# Patient Record
Sex: Male | Born: 1985
Health system: Southern US, Community
[De-identification: ages and names within clinical notes are randomized; demographics above are authoritative.]

## PROBLEM LIST (undated history)

## (undated) DIAGNOSIS — T8859XA Other complications of anesthesia, initial encounter: Secondary | ICD-10-CM

## (undated) DIAGNOSIS — T4145XA Adverse effect of unspecified anesthetic, initial encounter: Secondary | ICD-10-CM

## (undated) DIAGNOSIS — F419 Anxiety disorder, unspecified: Secondary | ICD-10-CM

## (undated) DIAGNOSIS — E782 Mixed hyperlipidemia: Secondary | ICD-10-CM

## (undated) DIAGNOSIS — K219 Gastro-esophageal reflux disease without esophagitis: Secondary | ICD-10-CM

## (undated) DIAGNOSIS — R519 Headache, unspecified: Secondary | ICD-10-CM

## (undated) DIAGNOSIS — K439 Ventral hernia without obstruction or gangrene: Secondary | ICD-10-CM

## (undated) DIAGNOSIS — G4730 Sleep apnea, unspecified: Secondary | ICD-10-CM

## (undated) DIAGNOSIS — T884XXA Failed or difficult intubation, initial encounter: Secondary | ICD-10-CM

## (undated) DIAGNOSIS — I10 Essential (primary) hypertension: Secondary | ICD-10-CM

## (undated) DIAGNOSIS — A6920 Lyme disease, unspecified: Secondary | ICD-10-CM

## (undated) DIAGNOSIS — R51 Headache: Secondary | ICD-10-CM

## (undated) DIAGNOSIS — F909 Attention-deficit hyperactivity disorder, unspecified type: Secondary | ICD-10-CM

## (undated) HISTORY — DX: Lyme disease, unspecified: A69.20

## (undated) HISTORY — PX: TONSILLECTOMY AND ADENOIDECTOMY: SUR1326

---

## 2010-10-06 ENCOUNTER — Encounter (INDEPENDENT_AMBULATORY_CARE_PROVIDER_SITE_OTHER): Payer: Self-pay | Admitting: Internal Medicine

## 2010-10-06 ENCOUNTER — Ambulatory Visit
Admission: RE | Admit: 2010-10-06 | Discharge: 2010-10-06 | Payer: Self-pay | Source: Home / Self Care | Attending: Internal Medicine | Admitting: Internal Medicine

## 2010-11-10 ENCOUNTER — Ambulatory Visit
Admission: RE | Admit: 2010-11-10 | Discharge: 2010-11-10 | Payer: Self-pay | Source: Home / Self Care | Attending: Internal Medicine | Admitting: Internal Medicine

## 2010-11-10 DIAGNOSIS — J111 Influenza due to unidentified influenza virus with other respiratory manifestations: Secondary | ICD-10-CM | POA: Insufficient documentation

## 2010-11-12 NOTE — Assessment & Plan Note (Signed)
Summary: VOMITTING AND CAN   Vital Signs:  Patient Profile:   25 Years Old Male CC:      Nausea & Vomiting Height:     70.75 inches Weight:      219 pounds BMI:     30.87 O2 Sat:      100 % O2 treatment:    Room Air Temp:     97.6 degrees F oral Pulse rate:   82 / minute Pulse rhythm:   regular Resp:     20 per minute BP sitting:   133 / 81  (right arm)  Pt. in pain?   yes    Location:   abdomen    Intensity:   4    Type:       aching  Vitals Entered By: Levonne Spiller EMT-P (October 06, 2010 12:23 PM)              Is Patient Diabetic? No  Does patient need assistance? Functional Status Self care Ambulation Normal      Current Allergies: ! AMOXICILLIN Lab Results    Ordered by:  Hyacinth Meeker md    Date tests performed: 10/06/2010    Performed by:  r.toney emt    R. Flu (A & B): Neg  History of Present Illness Chief Complaint: Nausea & Vomiting x 1 day. History of Present Illness: Sudden onset yesterday of vomiting after eating and watery diarrhea. no bleeding. no abd pain unless he tries to eat such as the hamburger last pm. No f/c, aches, fatigue but is quite hungry. Only med tried is aleve. No known exposures.   REVIEW OF SYSTEMS Constitutional Symptoms      Denies fever, chills, night sweats, weight loss, weight gain, and fatigue.  Eyes       Complains of glasses.      Denies change in vision, eye pain, eye discharge, contact lenses, and eye surgery. Ear/Nose/Throat/Mouth       Denies hearing loss/aids, change in hearing, ear pain, ear discharge, dizziness, frequent runny nose, frequent nose bleeds, sinus problems, sore throat, hoarseness, and tooth pain or bleeding.  Respiratory       Denies dry cough, productive cough, wheezing, shortness of breath, asthma, bronchitis, and emphysema/COPD.  Cardiovascular       Denies murmurs, chest pain, and tires easily with exhertion.    Gastrointestinal       Complains of stomach pain, nausea/vomiting, and diarrhea.       Denies constipation, blood in bowel movements, and indigestion. Genitourniary       Denies painful urination, kidney stones, and loss of urinary control. Neurological       Complains of headaches.      Denies paralysis, seizures, and fainting/blackouts.      Comments: headache gone Musculoskeletal       Denies muscle pain, joint pain, joint stiffness, decreased range of motion, redness, swelling, muscle weakness, and gout.  Skin       Denies bruising, unusual mles/lumps or sores, and hair/skin or nail changes.  Psych       Denies mood changes, temper/anger issues, anxiety/stress, speech problems, depression, and sleep problems. Lab Results    Ordered by:  Hyacinth Meeker md    Date tests performed: 10/06/2010    Performed by:  r.toney emt    R. Flu (A & B): Neg    Past History:  Past Medical History: Unremarkable  Past Surgical History: Tonsillectomy  Family History: Family History Diabetes 1st degree relative copd  Social  History: training presently for food service management Never Smoked Alcohol use-no Drug use-no Smoking Status:  never Drug Use:  no Physical Exam General appearance: well developed, well nourished, no acute distress Head: normocephalic, atraumatic Eyes: conjunctivae and lids normal Pupils: equal, round, reactive to light Ears: normal, no lesions or deformities Nasal: mucosa pink, nonedematous, no septal deviation, turbinates normal Oral/Pharynx: tongue normal, posterior pharynx without erythema or exudate Neck: neck supple,  trachea midline, no masses Chest/Lungs: no rales, wheezes, or rhonchi bilateral, breath sounds equal without effort Heart: regular rate and  rhythm, no murmur Abdomen: decreased bowel sounds but soft, non-tender without obvious organomegaly Extremities: normal extremities Neurological: grossly intact and non-focal Skin: no obvious rashes MSE: oriented to time, place, and person Assessment New Problems: FAMILY HISTORY DIABETES 1ST  DEGREE RELATIVE (ICD-V18.0) GASTROENTERITIS (ICD-558.9)   Plan  The patient and/or caregiver has been counseled thoroughly with regard to medications prescribed including dosage, schedule, interactions, rationale for use, and possible side effects and they verbalize understanding.  Diagnoses and expected course of recovery discussed and will return if not improved as expected or if the condition worsens. Patient and/or caregiver verbalized understanding.   Patient Instructions: 1)  clear liquid diet, advance as tolerated. 2)  immodium ad. 2 then 1 after each loose bm up to 8/day. 3)  emetrol for nausea. 4)  check temps. watch bm's for black or blood. 5)  Please schedule a follow-up appointment as needed. 6)  the main problem with gastroenteritis is dehydration. Drink plenty of fluids and take solids as you feel better. If you are unable to keep anything down and/or you show signs of dehydration(dry/cracked lips, lack of tears, not urinating, very sleepy), call our office. 7)  flu test was negative. 8)  out of work for 1-3 days as needed.

## 2010-11-18 NOTE — Assessment & Plan Note (Signed)
Summary: SORE, ACHY,CONGESTED, HEADACHE/JBB   Vital Signs:  Patient profile:   25 year old male O2 Sat:      100 % Temp:     98.3 degrees F oral Pulse rate:   88 / minute Pulse rhythm:   regular Resp:     14 per minute BP sitting:   128 / 82  (left arm)  CC:  sudden onset fever, myalgias, and fatigue yesterday.  History of Present Illness: patient with some clear nasal discharge starting 1/25. felt well otherwise. yesterday with sudden onset severe symptoms's. tried 3 aleve's this am. works in Personnel officer.  Medications Prior to Update: 1)  Aleve 220 Mg Tabs (Naproxen Sodium) .... As Needed  Allergies (verified): 1)  ! Amoxicillin  Past History:  Past Medical History: Last updated: 10/06/2010 Unremarkable  Past Surgical History: Last updated: 10/06/2010 Tonsillectomy  Social History: Last updated: 10/06/2010 training presently for food service management Never Smoked Alcohol use-no Drug use-no  Review of Systems       The patient complains of anorexia, fever, and headaches.         temp 102 before aleve. marked gen'l myalgias. headaches mild and similar to previous migraines. mild sore throat.  Physical Exam  General:  Well-developed,well-nourished,in no acute distress; alert,appropriate and cooperative throughout examination. never coughed Head:  Normocephalic and atraumatic without obvious abnormalities. No apparent alopecia or balding. Eyes:  No corneal or conjunctival inflammation noted. EOMI. Perrla. Ears:  External ear exam shows no significant lesions or deformities.  Otoscopic examination reveals clear canals, tympanic membranes are intact bilaterally without bulging, retraction, inflammation or discharge. Hearing is grossly normal bilaterally. Nose:  External nasal examination shows no deformity or inflammation. Nasal mucosa are pink and moist without lesions or exudates. Mouth:  Oral mucosa and oropharynx without lesions or exudates.  Teeth in good  repair. Neck:  supple and full ROM.   Lungs:  Normal respiratory effort, chest expands symmetrically. Lungs are clear to auscultation, no crackles or wheezes. Neurologic:  No cranial nerve deficits noted. Station and gait are normal.  Sensory, motor and coordinative functions appear intact. Skin:  Intact without suspicious rashes Cervical Nodes:  No lymphadenopathy noted Psych:  Cognition and judgment appear intact. Alert and cooperative with normal attention span and concentration.    Problems:  Medical Problems Added: 1)  Dx of Influenza Like Illness  (ICD-487.1)  Complete Medication List: 1)  Aleve 220 Mg Tabs (Naproxen sodium) .... As needed 2)  Tamiflu 75 Mg Caps (Oseltamivir phosphate) .Marland Kitchen.. 1 by mouth bid  Patient Instructions: 1)  fluids, rest, 2)  aleve max 2 two times a day pc. 3)  Take 650-1000mg  of Tylenol every 4-6 hours as needed for relief of pain or comfort of fever AVOID taking more than 4000mg   in a 24 hour period (can cause liver damage in higher doses 4)  out of work 2-3 days 5)  Please schedule a follow-up appointment as needed. Prescriptions: TAMIFLU 75 MG CAPS (OSELTAMIVIR PHOSPHATE) 1 by mouth bid  #10 x 0   Entered and Authorized by:   J. Juline Patch MD   Signed by:   Shela Commons. Juline Patch MD on 11/10/2010   Method used:   Electronically to        [facility] (retail)       [address], [address]       [place], [place]  [address]  Ph: [phone]       Fax: [phone]   RxID:   [id number]

## 2011-11-08 ENCOUNTER — Ambulatory Visit (INDEPENDENT_AMBULATORY_CARE_PROVIDER_SITE_OTHER): Payer: Self-pay | Admitting: Family Medicine

## 2011-11-08 ENCOUNTER — Encounter: Payer: Self-pay | Admitting: Family Medicine

## 2011-11-08 VITALS — BP 104/70 | HR 83 | Temp 97.8°F | Ht 71.0 in | Wt 234.0 lb

## 2011-11-08 DIAGNOSIS — R5383 Other fatigue: Secondary | ICD-10-CM

## 2011-11-08 LAB — CBC WITH DIFFERENTIAL/PLATELET
Basophils Relative: 0.6 % (ref 0.0–3.0)
Eosinophils Absolute: 0.1 10*3/uL (ref 0.0–0.7)
Hemoglobin: 14.6 g/dL (ref 13.0–17.0)
Lymphocytes Relative: 40 % (ref 12.0–46.0)
MCHC: 34.5 g/dL (ref 30.0–36.0)
Monocytes Relative: 5.1 % (ref 3.0–12.0)
Neutrophils Relative %: 53.1 % (ref 43.0–77.0)
RBC: 4.92 Mil/uL (ref 4.22–5.81)
WBC: 8.2 10*3/uL (ref 4.5–10.5)

## 2011-11-08 LAB — COMPREHENSIVE METABOLIC PANEL
ALT: 19 U/L (ref 0–53)
AST: 20 U/L (ref 0–37)
Albumin: 4.1 g/dL (ref 3.5–5.2)
BUN: 12 mg/dL (ref 6–23)
CO2: 31 mEq/L (ref 19–32)
Calcium: 9.3 mg/dL (ref 8.4–10.5)
Chloride: 106 mEq/L (ref 96–112)
GFR: 102.21 mL/min (ref >60.00)
Potassium: 4.2 mEq/L (ref 3.5–5.1)

## 2011-11-08 NOTE — Progress Notes (Signed)
Fatigue.  Going on for extended period.  Napping.  Needs to drink energy drinks to stay awake.  Persistent fatigue.  Weight has fluctuated.  Not snoring under usual circumstances, unless he's really had a hard day.  Drowsy during the day.  Ongoing since college.  H/o anemia.    Wife gave birth to twins recently, 1 died of heart malformation.   PMH and SH reviewed  ROS: See HPI, otherwise noncontributory.  Meds, vitals, and allergies reviewed.   GEN: nad, alert and oriented, he looks tired but affect and speech wnl HEENT: mucous membranes moist NECK: supple w/o LA, no TMG CV: rrr. PULM: ctab, no inc wob ABD: soft, +bs EXT: no edema SKIN: no acute rash

## 2011-11-08 NOTE — Patient Instructions (Addendum)
I would think about calling [facility] [phone]. You can get your results through our phone system.  Follow the instructions on the blue card. Take care.

## 2011-11-09 ENCOUNTER — Encounter: Payer: Self-pay | Admitting: Family Medicine

## 2011-11-09 DIAGNOSIS — R5383 Other fatigue: Secondary | ICD-10-CM | POA: Insufficient documentation

## 2011-11-09 HISTORY — DX: Other fatigue: R53.83

## 2011-11-09 NOTE — Assessment & Plan Note (Addendum)
Sx predate recent family crisis with death of child.  CBC/CMET/TSH wnl.  Refer for sleep study.  If neg, will need further eval for possible narcolepsy.  I don't think this is due to depression.  He appears to be grieving appropriately.  >30 min spent with face to face with patient, >50% counseling and/or coordinating care

## 2011-11-23 ENCOUNTER — Ambulatory Visit: Payer: BC Managed Care – PPO | Admitting: Family Medicine

## 2011-11-23 DIAGNOSIS — Z0289 Encounter for other administrative examinations: Secondary | ICD-10-CM

## 2011-12-01 ENCOUNTER — Institutional Professional Consult (permissible substitution): Payer: BC Managed Care – PPO | Admitting: Pulmonary Disease

## 2011-12-02 ENCOUNTER — Encounter: Payer: Self-pay | Admitting: Pulmonary Disease

## 2011-12-02 ENCOUNTER — Ambulatory Visit (INDEPENDENT_AMBULATORY_CARE_PROVIDER_SITE_OTHER): Payer: BC Managed Care – PPO | Admitting: Pulmonary Disease

## 2011-12-02 VITALS — BP 120/72 | HR 80 | Temp 98.1°F | Ht 70.5 in | Wt 237.6 lb

## 2011-12-02 DIAGNOSIS — G4710 Hypersomnia, unspecified: Secondary | ICD-10-CM | POA: Insufficient documentation

## 2011-12-02 NOTE — Patient Instructions (Signed)
Sleep study -overnight + daytime nap testing

## 2011-12-02 NOTE — Assessment & Plan Note (Signed)
Differential diagnosis of his hypersomnolence includes narcolepsy or idiopathic hypersomnolence. He does not seem to have obvious obstructive sleep apnea or periodic limb movement disorder. Chronic fatigue syndrome must also be considered especially given the history of Lyme's disease. An overnight polysomnogram will be scheduled followed by a multiple sleep latency test. We will look for sleep onset REM sleep during his naps. We will also explore sleep architecture and look for sleep disordered breathing. If this workup is negative and we may have to refer to a neurologist to explore chronic fatigue. I presume that his blood work has been checked for hypothyroidism

## 2011-12-02 NOTE — Progress Notes (Signed)
  Subjective:    Patient ID: Gerald [name], male    DOB: 02/06/1986, 26 y.o.   MRN: [id number]  HPI 26 year old never smoker presents for evaluation of hypersomnolence. Epworth sleepiness score is 18/24. He works as a Agricultural consultant for [employer] and report sleepiness and radius social situations such as sitting and reading sitting quietly after lunch as a passenger in a car or even during long drives. He reports being exhausted all day in spite of adequate sleep time more than 6 hours. He's unable to wake up at all in the mornings,' my body feels too sedated'. Around 2 to 3 PM he gets extremely tired. Bedtime is 11:30 to 30 a.m., sleep latency is less than 10 minutes, he sleeps on his side with one pillow, he has one nocturnal awakening at the most, gets out of bed at 8 to 10 AM feeling tired with dryness of mouth but no headaches. His wt has been as high as 280 pounds, recently has fluctuated between 210 and 237 pounds. There is no history suggestive of cataplexy. He reports inability to move his arms and legs on waking up from sleep-this history is not classic for sleep paralysis. He does report talking in his sleep but no other parasomnias.  He drinks 6 caffeinated beverages x20 ounces daily and will take an energy drink about twice a week. He reports Lyme's disease at the age of 78 was diagnosed at [facility]. He does not smoke or drink alcohol. No snoring or apneas have been witnessed by his wife. He has been married for 7 years and has 2 children. He reports sleep onset insomnia as a teenager and required Tylenol PM for a few years. He has always been a night owl since teenage years.    Review of Systems  Constitutional: Negative for fever, appetite change and unexpected weight change.  HENT: Negative for ear pain, congestion, sore throat, rhinorrhea, sneezing, trouble swallowing, dental problem and postnasal drip.   Eyes: Negative for redness.  Respiratory: Positive for cough. Negative for  shortness of breath and wheezing.   Cardiovascular: Negative for chest pain, palpitations and leg swelling.  Gastrointestinal: Negative for nausea, vomiting, abdominal pain and diarrhea.  Genitourinary: Negative for dysuria and urgency.  Musculoskeletal: Negative for joint swelling.  Skin: Negative for rash.  Neurological: Positive for headaches. Negative for syncope.  Hematological: Does not bruise/bleed easily.  Psychiatric/Behavioral: Negative for dysphoric mood. The patient is not nervous/anxious.        Objective:   Physical Exam  Gen. Pleasant, well-nourished, in no distress, normal affect ENT - no lesions, no post nasal drip, class 2 airway Neck: No JVD, no thyromegaly, no carotid bruits Lungs: no use of accessory muscles, no dullness to percussion, clear without rales or rhonchi  Cardiovascular: Rhythm regular, heart sounds  normal, no murmurs or gallops, no peripheral edema Abdomen: soft and non-tender, no hepatosplenomegaly, BS normal. Musculoskeletal: No deformities, no cyanosis or clubbing Neuro:  alert, non focal       Assessment & Plan:

## 2012-01-03 ENCOUNTER — Ambulatory Visit (HOSPITAL_BASED_OUTPATIENT_CLINIC_OR_DEPARTMENT_OTHER): Payer: BC Managed Care – PPO

## 2012-01-04 ENCOUNTER — Encounter (HOSPITAL_BASED_OUTPATIENT_CLINIC_OR_DEPARTMENT_OTHER): Payer: BC Managed Care – PPO

## 2012-09-28 ENCOUNTER — Ambulatory Visit (INDEPENDENT_AMBULATORY_CARE_PROVIDER_SITE_OTHER): Payer: BC Managed Care – PPO | Admitting: Family Medicine

## 2012-09-28 ENCOUNTER — Encounter: Payer: Self-pay | Admitting: Family Medicine

## 2012-09-28 VITALS — BP 116/82 | HR 93 | Temp 98.4°F | Wt 243.2 lb

## 2012-09-28 DIAGNOSIS — R05 Cough: Secondary | ICD-10-CM

## 2012-09-28 DIAGNOSIS — R6883 Chills (without fever): Secondary | ICD-10-CM

## 2012-09-28 DIAGNOSIS — R52 Pain, unspecified: Secondary | ICD-10-CM

## 2012-09-28 DIAGNOSIS — Z639 Problem related to primary support group, unspecified: Secondary | ICD-10-CM

## 2012-09-28 DIAGNOSIS — B349 Viral infection, unspecified: Secondary | ICD-10-CM

## 2012-09-28 DIAGNOSIS — B9789 Other viral agents as the cause of diseases classified elsewhere: Secondary | ICD-10-CM

## 2012-09-28 LAB — POCT INFLUENZA A/B: Influenza A, POC: NEGATIVE

## 2012-09-28 NOTE — Patient Instructions (Addendum)
Drink plenty of fluids, take tylenol or aleve (with food) as needed, and gargle with warm salt water for your throat.  This should gradually improve.  Take care.  Let us know if you have other concerns.

## 2012-09-28 NOTE — Progress Notes (Signed)
Sx started about 2 days ago. Diffuse aches noted first.  Felt 'swimmy headed' yesterday.  Rested at home yesterday.  No vomiting but some nausea.  Taking tylenol PM.  ST today.  Some diarrhea.  Fever yesterday.  Waking up with sweats.  Chills at night.    Hadn't had a flu shot yet.  Discussed.    Flu test neg and discussed with patient.  Had taken left over tamiflu w/o any change in symptoms so far  Meds, vitals, and allergies reviewed.   ROS: See HPI.  Otherwise, noncontributory.  GEN: nad, alert and oriented HEENT: mucous membranes moist, tm w/o erythema, nasal exam w/o erythema, clear discharge noted,  OP with cobblestoning and L side of soft palate with ulceration typical of viral infections.   NECK: supple w/o LA CV: rrr.   PULM: ctab, no inc wob EXT: no edema SKIN: no acute rash

## 2012-09-29 DIAGNOSIS — Z639 Problem related to primary support group, unspecified: Secondary | ICD-10-CM | POA: Insufficient documentation

## 2012-09-29 NOTE — Assessment & Plan Note (Signed)
D/w pt and he/wife are considering counseling.

## 2012-09-29 NOTE — Assessment & Plan Note (Signed)
Encouraged a flu shot when well.  Discussed with patient- likely viral.  Flu test neg.  He hadn't improved on tamiflu.  This may be a nonflu virus.  Supportive tx, f/u prn.  He agrees.  Nontoxic.

## 2012-11-30 ENCOUNTER — Ambulatory Visit: Payer: BC Managed Care – PPO | Admitting: Family Medicine

## 2012-11-30 DIAGNOSIS — Z0289 Encounter for other administrative examinations: Secondary | ICD-10-CM

## 2013-06-07 ENCOUNTER — Encounter: Payer: Self-pay | Admitting: Family Medicine

## 2013-06-07 ENCOUNTER — Ambulatory Visit (INDEPENDENT_AMBULATORY_CARE_PROVIDER_SITE_OTHER): Payer: BC Managed Care – PPO | Admitting: Family Medicine

## 2013-06-07 VITALS — BP 110/82 | HR 81 | Temp 97.9°F | Wt 237.8 lb

## 2013-06-07 DIAGNOSIS — J069 Acute upper respiratory infection, unspecified: Secondary | ICD-10-CM | POA: Insufficient documentation

## 2013-06-07 HISTORY — DX: Acute upper respiratory infection, unspecified: J06.9

## 2013-06-07 MED ORDER — AZITHROMYCIN 250 MG PO TABS: ORAL_TABLET | ORAL | Status: DC

## 2013-06-07 MED ORDER — BENZONATATE 200 MG PO CAPS: 200.0000 mg | ORAL_CAPSULE | Freq: Three times a day (TID) | ORAL | Status: DC | PRN

## 2013-06-07 NOTE — Assessment & Plan Note (Signed)
Start zmax given the duration and use tessalon prn cough.  Rest and fluids, f/u prn.  He agrees. Nontoxic.

## 2013-06-07 NOTE — Progress Notes (Signed)
He initially thought he had pinkeye.  Then had rhinorrhea.  His eyes improved but the congestion persisted, in spite of mucinex. Constantly clearing his throat, that caused a ST.  Going on >1 week.  No FCNAV.  No ear pain.  This isn't typical for allergies.  HA frontal and maxillary pain, also occipital.  No ear pain.  Mult sick exposures.    Meds, vitals, and allergies reviewed.   ROS: See HPI.  Otherwise, noncontributory.  GEN: nad, alert and oriented HEENT: mucous membranes moist, tm w/o erythema, nasal exam w/o erythema, clear discharge noted,  OP with cobblestoning NECK: supple w/o LA CV: rrr.   PULM: ctab, no inc wob EXT: no edema SKIN: no acute rash

## 2013-06-07 NOTE — Patient Instructions (Addendum)
Start the antibiotics today.  Tessalon for cough.  Drink plenty of fluids, take tylenol as needed, and gargle with warm salt water for your throat.  This should gradually improve.  Take care.  Let us know if you have other concerns.

## 2014-01-08 ENCOUNTER — Ambulatory Visit (INDEPENDENT_AMBULATORY_CARE_PROVIDER_SITE_OTHER): Payer: BC Managed Care – PPO | Admitting: Family Medicine

## 2014-01-08 ENCOUNTER — Encounter: Payer: Self-pay | Admitting: Family Medicine

## 2014-01-08 VITALS — BP 122/80 | HR 91 | Temp 98.4°F | Wt 262.5 lb

## 2014-01-08 DIAGNOSIS — R079 Chest pain, unspecified: Secondary | ICD-10-CM | POA: Insufficient documentation

## 2014-01-08 DIAGNOSIS — K219 Gastro-esophageal reflux disease without esophagitis: Secondary | ICD-10-CM

## 2014-01-08 DIAGNOSIS — R0789 Other chest pain: Secondary | ICD-10-CM

## 2014-01-08 NOTE — Assessment & Plan Note (Signed)
Likely chest wall pain with possible GERD component.  Main goal is weight loss with lifestyle changes.  D/w pt.  He agrees. He'll check with his wife about plans, scheduling time/meals.   Can use PPI PRN in meantime, reassured re: chest wall pain.  F/u prn.

## 2014-01-08 NOTE — Patient Instructions (Signed)
I think you can get the weight off gradually.   Let me know if you want to go to nutrition in the meantime.  Heart.org has good cooking ideas.   You can try OTC omeprazole in the meantime, taken daily.

## 2014-01-08 NOTE — Progress Notes (Signed)
Pre visit review using our clinic review tool, if applicable. No additional management support is needed unless otherwise documented below in the visit note.  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  Weight increased after that.  Less motivation and focus to lose weight noted.  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  He had been trying to exercise but this was irregular.  He has been "off the bandwagon."    Possible acid reflux.  Just to the L side of the sternum.  1-2/10.  Some days pressing on the area can make it better or worse. Worse with spicy foods.  Taking OTC nexium w/o much relief but he took it after the sx had started.  Going on for about 1-2 years.  Can happen at rest.  Not exertional, not worse with exertional.  He'll get SOB with exertion but he attributes that to deconditioning.   Meds, vitals, and allergies reviewed.   ROS: See HPI.  Otherwise, noncontributory.  nad ncat Mmm Neck supple, no LA rrr ctab abd soft L chest wall slightly ttp just lateral to sternum, some pain with deep breath but improved with external compression during deep breath.

## 2014-01-14 ENCOUNTER — Encounter: Payer: Self-pay | Admitting: Family Medicine

## 2014-01-14 ENCOUNTER — Ambulatory Visit (INDEPENDENT_AMBULATORY_CARE_PROVIDER_SITE_OTHER)
Admission: RE | Admit: 2014-01-14 | Discharge: 2014-01-14 | Disposition: A | Discharge status: Home / Self Care | Payer: BC Managed Care – PPO | Source: Ambulatory Visit | Attending: Family Medicine | Admitting: Family Medicine

## 2014-01-14 ENCOUNTER — Ambulatory Visit (INDEPENDENT_AMBULATORY_CARE_PROVIDER_SITE_OTHER): Payer: BC Managed Care – PPO | Admitting: Family Medicine

## 2014-01-14 VITALS — BP 120/80 | HR 83 | Temp 97.9°F | Ht 71.0 in | Wt 267.0 lb

## 2014-01-14 DIAGNOSIS — S93499A Sprain of other ligament of unspecified ankle, initial encounter: Secondary | ICD-10-CM

## 2014-01-14 DIAGNOSIS — M25579 Pain in unspecified ankle and joints of unspecified foot: Secondary | ICD-10-CM

## 2014-01-14 DIAGNOSIS — M25571 Pain in right ankle and joints of right foot: Secondary | ICD-10-CM

## 2014-01-14 DIAGNOSIS — S93491A Sprain of other ligament of right ankle, initial encounter: Secondary | ICD-10-CM

## 2014-01-14 DIAGNOSIS — S96819A Strain of other specified muscles and tendons at ankle and foot level, unspecified foot, initial encounter: Secondary | ICD-10-CM

## 2014-01-14 NOTE — Progress Notes (Signed)
Date:  01/14/2014   Name:  Gerald [name]   DOB:  05-16-1986   MRN:  [id number]  Primary Physician:  Crawford Givens, MD   Chief Complaint: Ankle Pain   Subjective:   History of Present Illness:  Gerald [name] is a 28 y.o. pleasant patient who presents with the following:  Right ankle pain, went up for a rebound and came down and classic lateral ankle injury. Walked off the injury, but it was still painful .  He was initially able to ambulate and walk off some pain, but he has had progressively worsening pain in the RIGHT ankle.  Date of injury January 13, 2014.  He has had multiple ankle sprains in the past, and none worsen this, but he is not had any specific occult fracture in the lower extremity. He denies any operative intervention in the lower extremity. He does have pain with really any sort of movement in any direction with the ankle.  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  Patient Active Problem List   Diagnosis Date Noted  . Atypical chest pain 01/08/2014  . Family circumstance 09/29/2012  . Hypersomnolence 12/02/2011  . Fatigue 11/09/2011    Past Medical History  Diagnosis Date  . Lyme disease     Past Surgical History  Procedure Laterality Date  . Tonsillectomy and adenoidectomy  07-Mar-1990    History   Social History  . Marital Status: Married    Spouse Name: N/A    Number of Children: N/A  . Years of Education: N/A   Occupational History  . Manager    Social History Main Topics  . Smoking status: Never Smoker   . Smokeless tobacco: Never Used  . Alcohol Use: No  . Drug Use: No  . Sexual Activity: Not on file   Other Topics Concern  . Not on file   Social History Narrative   Agricultural consultant for [employer]   Married   2 kids, 3rd child died of hypoplastic L heart 03-07-2012    Family History  Problem Relation Age of Onset  . Drug abuse Mother   . Hypertension Mother   . Diabetes Father     Allergies  Allergen  Reactions  . Amoxicillin     REACTION: Hives    Medication list has been reviewed and updated.  Review of Systems:  GEN: No fevers, chills. Nontoxic. Primarily MSK c/o today. MSK: Detailed in the HPI GI: tolerating PO intake without difficulty Neuro: No numbness, parasthesias, or tingling associated. Otherwise the pertinent positives of the ROS are noted above.   Objective:   Physical Examination: BP 120/80  Pulse 83  Temp(Src) 97.9 F (36.6 C) (Oral)  Ht 5\' 11"  (1.803 m)  Wt 267 lb (121.11 kg)  BMI 37.26 kg/m2  Ideal Body Weight: Weight in (lb) to have BMI = 25: 178.9   GEN: Well-developed,well-nourished,in no acute distress; alert,appropriate and cooperative throughout examination HEENT: Normocephalic and atraumatic without obvious abnormalities. Ears, externally no deformities PULM: Breathing comfortably in no respiratory distress EXT: No clubbing, cyanosis, or edema PSYCH: Normally interactive. Cooperative during the interview. Pleasant. Friendly and conversant. Not anxious or depressed appearing. Normal, full affect.  ANKLE: R Echymosis: no Edema: moderate dorsal and lateral ROM: Restricted dorsi and plantar flexion, inversion, eversion - eversion resistance markedly painful Gait: heel toe, antalgic Lateral Mall: NT Medial Mall: NT Talus: TTP dorsal talus Navicular: NT Cuboid: NT Calcaneous: NT Metatarsals: NT 5th MT: NT Phalanges: NT  Achilles: NT Plantar Fascia: NT Fat Pad: NT Peroneals: NT Post Tib: NT Great Toe: Nml motion Ant Drawer: pain, but intact Talar Tilt: pain ATFL: pain CFL: NT Deltoid: NT Str: 5/5 Other Special tests: none Sensation: intact   Dg Ankle Complete Right  01/14/2014   CLINICAL DATA:  Right ankle pain.  Trauma 01/13/2014.  EXAM: RIGHT ANKLE - COMPLETE 3+ VIEW  COMPARISON:  None.  FINDINGS: No fracture or dislocation.  IMPRESSION: No fracture or dislocation.   Electronically Signed   By: Bridgett LarssonSteve  Olson M.D.   On: 01/14/2014  14:20   The radiological images were independently reviewed by myself in the office and results were reviewed with the patient. My independent interpretation of images:  The mortise view is suboptimal. There is no evidence for occult fracture. There is no evidence for ankle dislocation. There is no significant osteoarthritis. Electronically Signed  By: Hannah BeatSpencer Carlin Attridge, MD On: 01/15/2014 11:01 AM  Assessment & Plan:   Sprain of anterior talofibular ligament of right ankle  Right ankle pain - Plan: DG Ankle Complete Right  This appears to be a grade 2 ATFL sprain. I suspect that the patient will do well. Anticipated recovery is at a minimum one month. 6 months to have full recovery of proprioception would be normal. I placed him in a ankle Aircast. He should be able to return to work immediately. Repeat basic rehabilitation.  Tylenol or Alleve for pain.  He can followup if he is having significant difficulty in 4-6 weeks.  Follow-up: No Follow-up on file.  New Prescriptions   No medications on file   Orders Placed This Encounter  Procedures  . DG Ankle Complete Right   There are no Patient Instructions on file for this visit.  Signed,  Elpidio GaleaSpencer T. Lashun Ramseyer, MD, CAQ Sports Medicine  [facility] [address] [place] [address] Phone: [phone] Fax: [phone]  Patient's Medications  New Prescriptions   No medications on file  Previous Medications   ACETAMINOPHEN (TYLENOL) 500 MG TABLET    Take 500 mg by mouth every 6 (six) hours as needed.   MULTIPLE VITAMIN (MULTIVITAMIN) TABLET    Take 1 tablet by mouth daily.   NAPROXEN SODIUM 220 MG CAPS    Take 1 capsule by mouth as needed.  Modified Medications   No medications on file  Discontinued Medications   NAPROXEN SODIUM (ANAPROX) 220 MG TABLET    Take 220 mg by mouth as needed.

## 2014-01-14 NOTE — Progress Notes (Signed)
Pre visit review using our clinic review tool, if applicable. No additional management support is needed unless otherwise documented below in the visit note. 

## 2014-05-10 ENCOUNTER — Ambulatory Visit (INDEPENDENT_AMBULATORY_CARE_PROVIDER_SITE_OTHER): Payer: BC Managed Care – PPO | Admitting: Family Medicine

## 2014-05-10 ENCOUNTER — Encounter: Payer: Self-pay | Admitting: Family Medicine

## 2014-05-10 VITALS — BP 118/80 | HR 68 | Temp 98.0°F | Resp 18 | Wt 257.8 lb

## 2014-05-10 DIAGNOSIS — R5383 Other fatigue: Principal | ICD-10-CM

## 2014-05-10 DIAGNOSIS — J018 Other acute sinusitis: Secondary | ICD-10-CM

## 2014-05-10 DIAGNOSIS — R5381 Other malaise: Secondary | ICD-10-CM

## 2014-05-10 MED ORDER — AZITHROMYCIN 250 MG PO TABS: ORAL_TABLET | ORAL | Status: DC

## 2014-05-10 MED ORDER — FLUTICASONE PROPIONATE 50 MCG/ACT NA SUSP: 2.0000 | Freq: Every day | NASAL | Status: DC

## 2014-05-10 NOTE — Patient Instructions (Signed)
Use flonase and warm compresses for now.  Try to get some rest.  If not better, then start the antibiotics.  Come back for labs when you feel better. Schedule a lab visit.  Take care.

## 2014-05-10 NOTE — Progress Notes (Signed)
Pre visit review using our clinic review tool, if applicable. No additional management support is needed unless otherwise documented below in the visit note.  Duration of symptoms: 2-3 days, nasal voice, sore and scratchy throat.  Congestion some better today but then more R sided facial pressure, near the R eye.  Concentration and focus are worse than normal.  Sleep is not disrupted.    Fatigued, and that predates the recent illness.  Not snoring, per wife's report.  He thinks he's a little more irritable, "but that may just be who I am" but not depressed.     Intentional weight loss with diet.    ROS: See HPI.  Otherwise negative.    Meds, vitals, and allergies reviewed.   GEN: nad, alert and oriented HEENT: mucous membranes moist, TM w/o erythema, nasal epithelium injected, OP with cobblestoning NECK: supple w/o LA CV: rrr. PULM: ctab, no inc wob ABD: soft, +bs EXT: no edema R frontal and max sinuses ttp

## 2014-05-12 ENCOUNTER — Encounter: Payer: Self-pay | Admitting: Family Medicine

## 2014-05-12 DIAGNOSIS — J019 Acute sinusitis, unspecified: Secondary | ICD-10-CM | POA: Insufficient documentation

## 2014-05-12 NOTE — Assessment & Plan Note (Signed)
Hold abx for now. Start flonase for now.  D/w pt.  See instructions.  Return for labs re: fatigue, wouldn't check in the acute illness.  D/w pt. He agrees.

## 2014-06-19 ENCOUNTER — Encounter: Payer: Self-pay | Admitting: Family Medicine

## 2014-06-19 ENCOUNTER — Ambulatory Visit (INDEPENDENT_AMBULATORY_CARE_PROVIDER_SITE_OTHER): Payer: BC Managed Care – PPO | Admitting: Family Medicine

## 2014-06-19 VITALS — BP 116/86 | HR 79 | Temp 98.1°F | Wt 260.5 lb

## 2014-06-19 DIAGNOSIS — R5381 Other malaise: Secondary | ICD-10-CM

## 2014-06-19 DIAGNOSIS — J018 Other acute sinusitis: Secondary | ICD-10-CM

## 2014-06-19 DIAGNOSIS — R5383 Other fatigue: Secondary | ICD-10-CM

## 2014-06-19 MED ORDER — DOXYCYCLINE HYCLATE 100 MG PO TABS: 100.0000 mg | ORAL_TABLET | Freq: Two times a day (BID) | ORAL | Status: DC

## 2014-06-19 MED ORDER — MOMETASONE FUROATE 50 MCG/ACT NA SUSP: 2.0000 | Freq: Every day | NASAL | Status: DC

## 2014-06-19 NOTE — Progress Notes (Signed)
Pre visit review using our clinic review tool, if applicable. No additional management support is needed unless otherwise documented below in the visit note.  He had gotten some better prev after the last OV, when he had used the abx rx'd.  In the meantime, he's had recurring sinus pressure, now B and frontal.  Had been trying to use flonase but didn't tolerate the scent, it would help in the long run but the scent was tough to handle.  No fevers.  No cough, some nausea w/o vomiting.  No rhinorrhea, not very stuffy.  No ST.   He had his eyes checked and that exam was unremarkable.    Still with fatigue.  D/w pt about getting labs done when not ill.  He had deferred lab visit prev.   Meds, vitals, and allergies reviewed.   ROS: See HPI.  Otherwise, noncontributory.  GEN: nad, alert and oriented HEENT: mucous membranes moist, tm w/o erythema, nasal exam w/o erythema, clear discharge noted,  OP with cobblestoning, frontal sinuses ttp NECK: supple w/o LA CV: rrr.   PULM: ctab, no inc wob EXT: no edema SKIN: no acute rash

## 2014-06-19 NOTE — Patient Instructions (Addendum)
Change to nasonex, use the doxycycline and notify me if not better.   Try to limit the excedrin in the meantime.  Take care. Glad to see you.   Schedule a fasting lab visit when you are better.

## 2014-06-20 NOTE — Assessment & Plan Note (Signed)
Change to doxy, change to nasonex since he didn't tolerate the scent with flonase.  Nontoxic.  Supportive care o/w. F/u prn.  He agrees.

## 2014-06-20 NOTE — Assessment & Plan Note (Signed)
Return for labs, discussed.

## 2014-07-01 ENCOUNTER — Other Ambulatory Visit: Payer: BC Managed Care – PPO

## 2014-11-28 ENCOUNTER — Telehealth: Payer: Self-pay | Admitting: Family Medicine

## 2014-11-28 NOTE — Telephone Encounter (Signed)
Patient Name: Gerald [name] DOB: 08/20/1986 Initial Comment Caller states has been c/o weakness, throat hurts, headaches, coughing, sore throat, feels like he has been hit by a truck. Oldest son has been DX w/ Strep on Sunday Nurse Assessment Nurse: Elijah Birkaldwell, RN, Stark BrayLynda Date/Time Lamount Cohen(Eastern Time): 11/28/2014 1:19:38 PM Confirm and document reason for call. If symptomatic, describe symptoms. ---Caller states has been c/o weakness, body aches, headaches, coughing, itchy sore throat, feels like he has been hit by a truck. Oldest son has been dx with Strep on Sunday. Does not have tonsils. Not sure if fever, but has chills. Has the patient traveled out of the country within the last 30 days? ---Not Applicable Does the patient require triage? ---Yes Related visit to physician within the last 2 weeks? ---No Does the PT have any chronic conditions? (i.e. diabetes, asthma, etc.) ---No Guidelines Guideline Title Affirmed Question Affirmed Notes Strep Throat Exposure [1] Sore throat AND [2] strep throat EXPOSURE (i.e., meets definition) within past 10 days Final Disposition User Call PCP within 24 Hours Newhopealdwell, Charity fundraiserN, Tees TohLynda

## 2014-11-28 NOTE — Telephone Encounter (Signed)
Patient states he will likely go to the [facility] late this afternoon but will call first thing in the am if he still needs an appt.

## 2014-11-28 NOTE — Telephone Encounter (Signed)
Can you get him in tomorrow AM?  Thanks.

## 2014-12-03 ENCOUNTER — Ambulatory Visit (INDEPENDENT_AMBULATORY_CARE_PROVIDER_SITE_OTHER): Payer: BLUE CROSS/BLUE SHIELD | Admitting: Nurse Practitioner

## 2014-12-03 ENCOUNTER — Encounter: Payer: Self-pay | Admitting: Nurse Practitioner

## 2014-12-03 VITALS — BP 132/88 | HR 90 | Temp 98.1°F | Resp 14 | Ht 71.0 in | Wt 279.1 lb

## 2014-12-03 DIAGNOSIS — J22 Unspecified acute lower respiratory infection: Secondary | ICD-10-CM

## 2014-12-03 DIAGNOSIS — J988 Other specified respiratory disorders: Secondary | ICD-10-CM

## 2014-12-03 MED ORDER — AZITHROMYCIN 250 MG PO TABS: ORAL_TABLET | ORAL | Status: DC

## 2014-12-03 MED ORDER — HYDROCOD POLST-CHLORPHEN POLST 10-8 MG/5ML PO LQCR: 5.0000 mL | Freq: Every evening | ORAL | Status: DC | PRN

## 2014-12-03 NOTE — Progress Notes (Signed)
Pre visit review using our clinic review tool, if applicable. No additional management support is needed unless otherwise documented below in the visit note. 

## 2014-12-03 NOTE — Progress Notes (Signed)
   Subjective:    Patient ID: Gerald [name], male    DOB: 09/07/1986, 29 y.o.   MRN: [id number]  HPI  Gerald [name] is a 29 yo male with a CC of cough, sinusitis, laryngitis x 5 days. [facility]   1) Symptoms of the flu. Fever, body aches, chills. He reports he tested negative, but the NP felt it was early so she prescribed Tamiflu- improved Cough bad all day dry  Tylenol- helpful  Mucinex- helpful  Night time mucinex- somewhat helpful    Review of Systems  Constitutional: Positive for fatigue.  HENT: Positive for congestion, postnasal drip, sneezing, sore throat and voice change. Negative for ear discharge and ear pain.   Eyes: Negative for visual disturbance.  Respiratory: Positive for cough. Negative for chest tightness, shortness of breath and wheezing.   Cardiovascular: Negative for chest pain, palpitations and leg swelling.  Gastrointestinal: Negative for nausea, vomiting and diarrhea.  Skin: Negative for rash.  Neurological: Positive for light-headedness. Negative for dizziness and headaches.       Light headed with coughing        Objective:   Physical Exam  Constitutional: He is oriented to person, place, and time. He appears well-developed and well-nourished. No distress.  BP 132/88 mmHg  Pulse 90  Temp(Src) 98.1 F (36.7 C) (Oral)  Resp 14  Ht 5\' 11"  (1.803 m)  Wt 279 lb 1.6 oz (126.599 kg)  BMI 38.94 kg/m2  SpO2 95%   HENT:  Head: Normocephalic and atraumatic.  Right Ear: External ear normal.  Left Ear: External ear normal.  Eyes: Right eye exhibits no discharge. Left eye exhibits no discharge. No scleral icterus.  Neck: Normal range of motion. Neck supple. No thyromegaly present.  Cardiovascular: Normal rate, regular rhythm, normal heart sounds and intact distal pulses.  Exam reveals no gallop and no friction rub.   No murmur heard. Pulmonary/Chest: Effort normal. No respiratory distress. He has no wheezes. He  has no rales. He exhibits no tenderness.  Decreased breath sounds all over  Lymphadenopathy:    He has no cervical adenopathy.  Neurological: He is alert and oriented to person, place, and time.  Skin: Skin is warm and dry. He is not diaphoretic.      Assessment & Plan:

## 2014-12-03 NOTE — Patient Instructions (Signed)
Call if not improving by Monday.

## 2014-12-07 DIAGNOSIS — J22 Unspecified acute lower respiratory infection: Secondary | ICD-10-CM | POA: Insufficient documentation

## 2014-12-07 NOTE — Assessment & Plan Note (Signed)
Treating for URI/Lower respiratory infection. Z-pack 500 mg day 1 and 250 mg through day 4. Continue other conservative management. FU prn worsening/failure to improve.

## 2015-03-14 ENCOUNTER — Ambulatory Visit (INDEPENDENT_AMBULATORY_CARE_PROVIDER_SITE_OTHER): Payer: BLUE CROSS/BLUE SHIELD | Admitting: Primary Care

## 2015-03-14 ENCOUNTER — Encounter: Payer: Self-pay | Admitting: Primary Care

## 2015-03-14 VITALS — BP 128/90 | HR 105 | Temp 98.4°F | Ht 71.0 in | Wt 273.1 lb

## 2015-03-14 DIAGNOSIS — J329 Chronic sinusitis, unspecified: Secondary | ICD-10-CM | POA: Diagnosis not present

## 2015-03-14 DIAGNOSIS — R059 Cough, unspecified: Secondary | ICD-10-CM

## 2015-03-14 DIAGNOSIS — R05 Cough: Secondary | ICD-10-CM

## 2015-03-14 MED ORDER — AZITHROMYCIN 250 MG PO TABS: ORAL_TABLET | ORAL | Status: DC

## 2015-03-14 MED ORDER — BENZONATATE 200 MG PO CAPS: 200.0000 mg | ORAL_CAPSULE | Freq: Three times a day (TID) | ORAL | Status: DC | PRN

## 2015-03-14 NOTE — Progress Notes (Signed)
Pre visit review using our clinic review tool, if applicable. No additional management support is needed unless otherwise documented below in the visit note. 

## 2015-03-14 NOTE — Progress Notes (Signed)
Subjective:    Patient ID: Gerald NordmannNicholas [name], male    DOB: 04/03/1986, 29 y.o.   MRN: [id number]  HPI  Gerald [name] is a 29 year old male who presents today with a chief complaint of cough. His cough has been present intermittently throughout the year with worsening cough over the past 10 days. His cough is non productive. He also reports a scratchy throat, postnasal drip, sinus pressure, and fatigue. He's tried taking Flonase and nyquil without relief. He has a history of recurrent sinus infections which require treatment with antibiotics. He's not been evaluated with ENT. He does not take a daily claritin, zyrtec, allegra.  Review of Systems  Constitutional: Positive for fever. Negative for chills.  HENT: Positive for postnasal drip. Negative for congestion, ear pain, sinus pressure and sore throat.   Respiratory: Positive for cough. Negative for shortness of breath.   Cardiovascular: Negative for chest pain.  Gastrointestinal: Negative for nausea.  Musculoskeletal: Negative for myalgias.       Past Medical History  Diagnosis Date  . Lyme disease     History   Social History  . Marital Status: Married    Spouse Name: N/A  . Number of Children: N/A  . Years of Education: N/A   Occupational History  . Manager    Social History Main Topics  . Smoking status: Never Smoker   . Smokeless tobacco: Never Used  . Alcohol Use: No  . Drug Use: No  . Sexual Activity: Not on file   Other Topics Concern  . Not on file   Social History Narrative   Agricultural consultantDistrict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 [employer]   Married   2 kids, 3rd child died of hypoplastic L heart 2013    Past Surgical History  Procedure Laterality Date  . Tonsillectomy and adenoidectomy  ~1991    Family History  Problem Relation Age of Onset  . Drug abuse Mother   . Hypertension Mother   . Diabetes Father     Allergies  Allergen Reactions  . Amoxicillin     REACTION: Hives    Current Outpatient Prescriptions on File Prior to  Visit  Medication Sig Dispense Refill  . aspirin-acetaminophen-caffeine (EXCEDRIN MIGRAINE) 250-250-65 MG per tablet Take by mouth every 6 (six) hours as needed for headache.    . chlorpheniramine-HYDROcodone (TUSSIONEX) 10-8 MG/5ML LQCR Take 5 mLs by mouth at bedtime as needed for cough. 115 mL 0  . Multiple Vitamin (MULTIVITAMIN) tablet Take 1 tablet by mouth daily.     No current facility-administered medications on file prior to visit.    BP 128/90 mmHg  Pulse 105  Temp(Src) 98.4 F (36.9 C) (Oral)  Ht 5\' 11"  (1.803 m)  Wt 273 lb 1.9 oz (123.886 kg)  BMI 38.11 kg/m2  SpO2 95%    Objective:   Physical Exam  Constitutional: He appears ill.  HENT:  Right Ear: Tympanic membrane and ear canal normal.  Left Ear: Tympanic membrane and ear canal normal.  Nose: Right sinus exhibits no maxillary sinus tenderness and no frontal sinus tenderness. Left sinus exhibits no maxillary sinus tenderness and no frontal sinus tenderness.  Mouth/Throat: Oropharynx is clear and moist.  Eyes: Conjunctivae are normal. Pupils are equal, round, and reactive to light.  Neck: Neck supple.  Cardiovascular: Normal rate and regular rhythm.   Pulmonary/Chest: Effort normal and breath sounds normal.  Lymphadenopathy:    He has no cervical adenopathy.  Skin: Skin is warm and dry.  Assessment & Plan:  Cough:  Suspect this was allergy induced, but has developed into bacterial. Present for 10 days with sinus pressure, fatigue, postnasal drip. History of recurrent sinusitis which have required treatment with antibiotics. RX for Zpak and Tessalon pearls. Start Claritin daily. Referral made to ENT for further evaluation of sinus pressure and recurrent infections.

## 2015-03-14 NOTE — Patient Instructions (Signed)
Start Azithromycin antibiotics. Take 2 tablets by mouth today, then 1 tablet by mouth daily for 4 additional days. You may take Tessalon Pearls three times daily as needed for cough. You will be contacted regarding your referral to ENT.  Please let us know if you have not heard back within one week.   It was nice meeting you!

## 2015-04-09 ENCOUNTER — Encounter: Payer: Self-pay | Admitting: Primary Care

## 2015-04-09 ENCOUNTER — Ambulatory Visit: Payer: BLUE CROSS/BLUE SHIELD | Admitting: Internal Medicine

## 2015-04-09 ENCOUNTER — Ambulatory Visit (INDEPENDENT_AMBULATORY_CARE_PROVIDER_SITE_OTHER): Payer: BLUE CROSS/BLUE SHIELD | Admitting: Primary Care

## 2015-04-09 VITALS — BP 126/76 | HR 101 | Temp 97.6°F | Ht 71.0 in | Wt 268.8 lb

## 2015-04-09 DIAGNOSIS — F411 Generalized anxiety disorder: Secondary | ICD-10-CM | POA: Insufficient documentation

## 2015-04-09 MED ORDER — ESCITALOPRAM OXALATE 10 MG PO TABS: 10.0000 mg | ORAL_TABLET | Freq: Every day | ORAL | Status: DC

## 2015-04-09 NOTE — Assessment & Plan Note (Signed)
Anxious entire life, but moreso anxious since beginning to mid June. +irritability, daytime tiredness, sleep disturbance, worry. PHQ-9 score of 16 today. Start Lexapro 10 mg tablets. 1/2 tablet daily x 6 days, then 1 full tab thereafter. Explained possible side effects, denies SI/HI. Follow up in 6 weeks for re-eval with myself or PCP.

## 2015-04-09 NOTE — Progress Notes (Signed)
Subjective:    Patient ID: Gerald NordmannNicholas [name], male    DOB: 02/17/1986, 29 y.o.   MRN: [id number]  HPI  Gerald [name] is a 29 year old male who presents today with a chief complaint of anxiety. He first noticed his anxiety on June 9th. He returned from a [place] trip to [place] and noticed he was anxious, tearful, irritable all week. 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 He felt well during that following weekend and week, but he's been feeling anxious again this entire week with irritability, nervousness, and anxiousness. He is on the Thrive patch and has been on since Easter 2016. He stopped the Thrive treatment one week ago and continues to feel anxious.  He's noticed a decrease in his appetite intermittently since June 13th, he will wake up in the middle of the night with difficulty going back to sleep, feels daytime tiredness, difficulty concentrating, daily worry. He also recently started taking Claritin OTC during the 1st week of June (which he believes may be causing anxiety) and has not had since this past Sunday. He reports a lifetime history of worry and was talking with one of his friends this week who told him he was "high strung". Denies SI/HI. PHQ 9 score of 16 today.  Review of Systems  Respiratory: Negative for shortness of breath.   Cardiovascular: Negative for chest pain.  Neurological: Negative for headaches.  Psychiatric/Behavioral: Positive for sleep disturbance, decreased concentration and agitation. Negative for suicidal ideas. The patient is nervous/anxious.        Past Medical History  Diagnosis Date  . Lyme disease     History   Social History  . Marital Status: Married    Spouse Name: N/A  . Number of Children: N/A  . Years of Education: N/A   Occupational History  . Manager    Social History Main Topics  . Smoking status: Never Smoker   . Smokeless tobacco: Never Used  . Alcohol Use: No  . Drug Use: No  . Sexual Activity: Not on file   Other Topics  Concern  . Not on file   Social History Narrative   Agricultural consultantDistrict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 [employer]   Married   2 kids, 3rd child died of hypoplastic L heart 2013    Past Surgical History  Procedure Laterality Date  . Tonsillectomy and adenoidectomy  ~1991    Family History  Problem Relation Age of Onset  . Drug abuse Mother   . Hypertension Mother   . Diabetes Father     Allergies  Allergen Reactions  . Amoxicillin     REACTION: Hives    Current Outpatient Prescriptions on File Prior to Visit  Medication Sig Dispense Refill  . aspirin-acetaminophen-caffeine (EXCEDRIN MIGRAINE) 250-250-65 MG per tablet Take by mouth every 6 (six) hours as needed for headache.    . Multiple Vitamin (MULTIVITAMIN) tablet Take 1 tablet by mouth daily.    . Nicotine Polacrilex (THRIVE MT) Inject 1 patch into the skin.     No current facility-administered medications on file prior to visit.    BP 126/76 mmHg  Pulse 101  Temp(Src) 97.6 F (36.4 C) (Oral)  Ht 5\' 11"  (1.803 m)  Wt 268 lb 12.8 oz (121.927 kg)  BMI 37.51 kg/m2  SpO2 98%    Objective:   Physical Exam  Constitutional: He appears well-nourished.  Cardiovascular: Normal rate and regular rhythm.   Pulmonary/Chest: Effort normal and breath sounds normal.  Skin: Skin is  warm and dry.  Psychiatric: He has a normal mood and affect.          Assessment & Plan:

## 2015-04-09 NOTE — Patient Instructions (Signed)
Start Lexapro tablets for anxiety. Take 1/2 tablet by mouth daily for 6 days, then advance to 1 full tablet thereafter.  Follow up in 6 weeks for re-evaluation of anxiety.  It was nice to see you!

## 2015-08-16 ENCOUNTER — Other Ambulatory Visit: Payer: Self-pay | Admitting: Primary Care

## 2015-08-17 NOTE — Telephone Encounter (Signed)
Electronically refill request for   escitalopram (LEXAPRO) 10 MG tablet   Take 1 tablet (10 mg total) by mouth daily.  Dispense: 30 tablet   Refills: 3    Last prescribed on 04/09/2015. Last seen for anxiety with Jae DireKate on 04/09/2015. No future appointment.

## 2015-08-19 NOTE — Telephone Encounter (Signed)
sending

## 2015-08-21 NOTE — Telephone Encounter (Signed)
Sent a letter on 08/19/2015 for patient to schedule a follow up for further refills.

## 2015-10-14 ENCOUNTER — Encounter: Payer: Self-pay | Admitting: Primary Care

## 2015-10-14 ENCOUNTER — Ambulatory Visit (INDEPENDENT_AMBULATORY_CARE_PROVIDER_SITE_OTHER): Payer: BLUE CROSS/BLUE SHIELD | Admitting: Primary Care

## 2015-10-14 VITALS — BP 126/86 | HR 80 | Temp 97.7°F | Ht 71.0 in | Wt 280.8 lb

## 2015-10-14 DIAGNOSIS — F411 Generalized anxiety disorder: Secondary | ICD-10-CM | POA: Diagnosis not present

## 2015-10-14 MED ORDER — ESCITALOPRAM OXALATE 20 MG PO TABS: 20.0000 mg | ORAL_TABLET | Freq: Every day | ORAL | Status: DC

## 2015-10-14 NOTE — Progress Notes (Signed)
Pre visit review using our clinic review tool, if applicable. No additional management support is needed unless otherwise documented below in the visit note. 

## 2015-10-14 NOTE — Progress Notes (Signed)
   Subjective:    Patient ID: Gerald [name], male    DOB: 02/11/1986, 30 y.o.   MRN: [id number]  HPI  Gerald [name] is a 30 year old male who presents today for follow up of generalized anxiety disorder. He was initially evaluated in June 2016 for complaints of anxiety due to a high stress occupation. He was initiated on Lexapro 10 mg tablets and was instructed to follow up with PCP 6 weeks later.  Since his last visit he's noticed more "lethergy" in the morning with increased difficulty with getting out of bed and feels as though its harder to get his day started. Overall he's noticed a moderate decrease in his daily worry, anxiety, home life, and work life but continues to feel some symptoms. He states "my fuse is longer, but the reaction at the end is the same."  Denies GI upset, SI/HI, headaches.   Review of Systems  Respiratory: Negative for shortness of breath.   Cardiovascular: Negative for chest pain.  Gastrointestinal: Negative for nausea.  Neurological: Negative for headaches.  Psychiatric/Behavioral: Negative for suicidal ideas and sleep disturbance. The patient is nervous/anxious.        Past Medical History  Diagnosis Date  . Lyme disease     Social History   Social History  . Marital Status: Married    Spouse Name: N/A  . Number of Children: N/A  . Years of Education: N/A   Occupational History  . Manager    Social History Main Topics  . Smoking status: Never Smoker   . Smokeless tobacco: Never Used  . Alcohol Use: No  . Drug Use: No  . Sexual Activity: Not on file   Other Topics Concern  . Not on file   Social History Narrative   Agricultural consultantDistrict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 [employer]   Married   2 kids, 3rd child died of hypoplastic L heart 2013    Past Surgical History  Procedure Laterality Date  . Tonsillectomy and adenoidectomy  ~1991    Family History  Problem Relation Age of Onset  . Drug abuse Mother   . Hypertension Mother   . Diabetes Father     Allergies    Allergen Reactions  . Amoxicillin     REACTION: Hives    Current Outpatient Prescriptions on File Prior to Visit  Medication Sig Dispense Refill  . aspirin-acetaminophen-caffeine (EXCEDRIN MIGRAINE) 250-250-65 MG per tablet Take by mouth every 6 (six) hours as needed for headache. Reported on 10/14/2015    . Multiple Vitamin (MULTIVITAMIN) tablet Take 1 tablet by mouth daily.     No current facility-administered medications on file prior to visit.    BP 126/86 mmHg  Pulse 80  Temp(Src) 97.7 F (36.5 C) (Oral)  Ht 5\' 11"  (1.803 m)  Wt 280 lb 12.8 oz (127.37 kg)  BMI 39.18 kg/m2  SpO2 97%    Objective:   Physical Exam  Constitutional: He appears well-nourished.  Cardiovascular: Normal rate and regular rhythm.   Pulmonary/Chest: Effort normal and breath sounds normal.  Skin: Skin is warm and dry.  Psychiatric: He has a normal mood and affect.          Assessment & Plan:

## 2015-10-14 NOTE — Patient Instructions (Signed)
Start Lexapro 20 mg. Take 1 tablet by mouth everyday at 4:30 pm. Adjust earlier if you continue to notice drowsiness.  Please provide me with an update in 2 weeks.  Please schedule a physical with me within the next 3 months. You may also schedule a lab only appointment 3-4 days prior. We will discuss your lab results in detail during your physical.  It was a pleasure to see you today!

## 2015-10-14 NOTE — Assessment & Plan Note (Signed)
Improved on Lexapro 10 mg, although doesn't feel quite at goal. Some drowsiness in the AM with difficulty getting his day started. Will increase dose to 20 mg and have him start taking in early afternoon. He is to call me in 2 weeks with an update. Denies SI/HI.

## 2015-10-28 ENCOUNTER — Telehealth: Payer: Self-pay | Admitting: Primary Care

## 2015-10-28 NOTE — Telephone Encounter (Signed)
Called and spoke to patient. He stated that he is doing better. He does get a little drowsiness around 9 but it is a lot easier.

## 2015-10-28 NOTE — Telephone Encounter (Signed)
Will you check on Gerald [name]? We increased his Lexapro to 20 mg. Is he feeling any better? Any drowsiness with taking the medication in the afternoon?

## 2015-12-04 ENCOUNTER — Encounter: Payer: Self-pay | Admitting: Primary Care

## 2015-12-04 ENCOUNTER — Ambulatory Visit (INDEPENDENT_AMBULATORY_CARE_PROVIDER_SITE_OTHER): Payer: BLUE CROSS/BLUE SHIELD | Admitting: Primary Care

## 2015-12-04 VITALS — BP 130/88 | HR 89 | Temp 98.0°F | Ht 71.0 in | Wt 282.8 lb

## 2015-12-04 DIAGNOSIS — E669 Obesity, unspecified: Secondary | ICD-10-CM | POA: Diagnosis not present

## 2015-12-04 DIAGNOSIS — E6609 Other obesity due to excess calories: Secondary | ICD-10-CM | POA: Insufficient documentation

## 2015-12-04 DIAGNOSIS — E66812 Obesity, class 2: Secondary | ICD-10-CM | POA: Insufficient documentation

## 2015-12-04 DIAGNOSIS — Z6841 Body Mass Index (BMI) 40.0 and over, adult: Secondary | ICD-10-CM

## 2015-12-04 NOTE — Assessment & Plan Note (Signed)
Frustrated with steady weight gain. Endorses poor diet and does not exercise. Discussed the importance of a healthy diet and regular exercise in order for weight loss and to reduce risk of other medical diseases. Tips provided regarding changes to make in his current diet. Encouraged he follow up with complete physical.

## 2015-12-04 NOTE — Progress Notes (Signed)
Subjective:    Patient ID: Gerald [name], male    DOB: [birth year], 30 y.o.   MRN: [id number]  HPI  [name] is a 30 year old male who presents today with multiple complaints.  1) Cough: He also reports nasal congestion, chest congestion, fatigue, shortness of breath. Denies fevers, sick contacts. His symptoms began yesterday afternoon. He's recently been at a large conference in [place] and reports people there with similar symptoms. He's taken tessalon peals for cough for 1 day without significant improvement, otherwise has not taken anything OTC. The cough is hismost bothersome symptom.  2) Mass to Back: Located to posterior chest wall for the past 1 year. Denies growth, changes in size, tenderness, erythema. His wife was massaging his back several days ago and was concerned. He is not bothered by this mass.  3) Obesity: Discouraged and unmotivated to lose weight. He's noticed an increase in his weight gradually over the year and is frustrated. He works for [employer] and eats the food there frequently. He endorses a poor diet overall and does not exercise. He drinks little to no water daily.  Wt Readings from Last 3 Encounters:  12/04/15 282 lb 12.8 oz (128.277 kg)  10/14/15 280 lb 12.8 oz (127.37 kg)  04/09/15 268 lb 12.8 oz (121.927 kg)     Review of Systems  Constitutional: Positive for fatigue. Negative for fever and chills.  HENT: Positive for congestion, postnasal drip, sinus pressure and sore throat.   Respiratory: Positive for cough. Negative for shortness of breath.   Cardiovascular: Negative for chest pain.  Gastrointestinal: Negative for nausea.  Musculoskeletal: Positive for myalgias.  Skin:       Mass to posterior chest wall       Past Medical History  Diagnosis Date  . Lyme disease     Social History   Social History  . Marital Status: Married    Spouse Name: N/A  . Number of Children: N/A  . Years of Education: N/A   Occupational History  . Manager     Social History Main Topics  . Smoking status: Never Smoker   . Smokeless tobacco: Never Used  . Alcohol Use: No  . Drug Use: No  . Sexual Activity: Not on file   Other Topics Concern  . Not on file   Social History Narrative   Agricultural consultant for [employer]   Married   2 kids, 3rd child died of hypoplastic L heart 03-01-2012    Past Surgical History  Procedure Laterality Date  . Tonsillectomy and adenoidectomy  03-01-90    Family History  Problem Relation Age of Onset  . Drug abuse Mother   . Hypertension Mother   . Diabetes Father     Allergies  Allergen Reactions  . Amoxicillin     REACTION: Hives    Current Outpatient Prescriptions on File Prior to Visit  Medication Sig Dispense Refill  . aspirin-acetaminophen-caffeine (EXCEDRIN MIGRAINE) 250-250-65 MG per tablet Take by mouth every 6 (six) hours as needed for headache. Reported on 10/14/2015    . escitalopram (LEXAPRO) 20 MG tablet Take 1 tablet (20 mg total) by mouth daily. 30 tablet 1  . Multiple Vitamin (MULTIVITAMIN) tablet Take 1 tablet by mouth daily.    . ranitidine (ZANTAC) 150 MG tablet Take 150 mg by mouth 2 (two) times daily.     No current facility-administered medications on file prior to visit.    BP 130/88 mmHg  Pulse 89  Temp(Src) 98  F (36.7 C) (Oral)  Ht  (1.803 m)  Wt 282 lb 12.8 oz (128.277 kg)  BMI 39.46 kg/m2  SpO2 98%    Objective:   Physical Exam  Constitutional: He appears well-nourished.  HENT:  Right Ear: Tympanic membrane and ear canal normal.  Left Ear: Tympanic membrane and ear canal normal.  Nose: Right sinus exhibits no maxillary sinus tenderness and no frontal sinus tenderness. Left sinus exhibits no maxillary sinus tenderness and no frontal sinus tenderness.  Mouth/Throat: Oropharynx is clear and moist.  Neck: Neck supple.  Cardiovascular: Normal rate and regular rhythm.   Pulmonary/Chest: Effort normal and breath sounds normal.  Lymphadenopathy:    He has no  cervical adenopathy.  Skin: Skin is warm and dry.  1 cm cyst like mass to right side, mid, back. Non tender.          Assessment & Plan:  URI:  Cough, nasal congestion, fatigue, headache x 1 day. Has not taken much OTC at this point. Exam with clear lungs and normal HENT examination. Will treat with supportive measures at this point. Flonase, claritin, tessalon pearls.  Cyst:  1 cm cyst like mass to posterior chest. Not infectious, non tender. Will watch and wait. Appears benign. Return precautions provided.

## 2015-12-04 NOTE — Progress Notes (Signed)
Pre visit review using our clinic review tool, if applicable. No additional management support is needed unless otherwise documented below in the visit note. 

## 2015-12-04 NOTE — Patient Instructions (Addendum)
Your symptoms are related to a viral illness which will pass on its own.  Cough/Congestion: Try taking Mucinex DM. This will help loosen up the mucous in your chest. Ensure you take this medication with a full glass of water.  Throat drainage: Zyrtec or Claritin antihistamine.  Nasal Congestion: Try using Flonase (fluticasone) nasal spray. Instill 2 sprays in each nostril once daily.   Continue the benzonatate capsules as needed for cough. You may try Nyquil at bedtime for sleep and rest.  It is important that you improve your diet. Please limit carbohydrates in the form of white bread, rice, pasta, fast food, sweets, sugary drinks, etc. Increase your consumption of fresh fruits and vegetables.  You need to consume about 2 liters of water daily.  Start exercising. You should be getting 1 hour of moderate intensity exercise 5 days weekly.  Please schedule a physical with me this Spring or Summer at your convenience.  You may also schedule a lab only appointment 3-4 days prior. We will discuss your lab results in detail during your physical.  It was a pleasure to see you today!  Upper Respiratory Infection, Adult Most upper respiratory infections (URIs) are a viral infection of the air passages leading to the lungs. A URI affects the nose, throat, and upper air passages. The most common type of URI is nasopharyngitis and is typically referred to as "the common cold." URIs run their course and usually go away on their own. Most of the time, a URI does not require medical attention, but sometimes a bacterial infection in the upper airways can follow a viral infection. This is called a secondary infection. Sinus and middle ear infections are common types of secondary upper respiratory infections. Bacterial pneumonia can also complicate a URI. A URI can worsen asthma and chronic obstructive pulmonary disease (COPD). Sometimes, these complications can require emergency medical care and may be life  threatening.  CAUSES Almost all URIs are caused by viruses. A virus is a type of germ and can spread from one person to another.  RISKS FACTORS You may be at risk for a URI if:   You smoke.   You have chronic heart or lung disease.  You have a weakened defense (immune) system.   You are very young or very old.   You have nasal allergies or asthma.  You work in crowded or poorly ventilated areas.  You work in health care facilities or schools. SIGNS AND SYMPTOMS  Symptoms typically develop 2-3 days after you come in contact with a cold virus. Most viral URIs last 7-10 days. However, viral URIs from the influenza virus (flu virus) can last 14-18 days and are typically more severe. Symptoms may include:   Runny or stuffy (congested) nose.   Sneezing.   Cough.   Sore throat.   Headache.   Fatigue.   Fever.   Loss of appetite.   Pain in your forehead, behind your eyes, and over your cheekbones (sinus pain).  Muscle aches.  DIAGNOSIS  Your health care provider may diagnose a URI by:  Physical exam.  Tests to check that your symptoms are not due to another condition such as:  Strep throat.  Sinusitis.  Pneumonia.  Asthma. TREATMENT  A URI goes away on its own with time. It cannot be cured with medicines, but medicines may be prescribed or recommended to relieve symptoms. Medicines may help:  Reduce your fever.  Reduce your cough.  Relieve nasal congestion. HOME CARE INSTRUCTIONS  Take medicines only as directed by your health care provider.   Gargle warm saltwater or take cough drops to comfort your throat as directed by your health care provider.  Use a warm mist humidifier or inhale steam from a shower to increase air moisture. This may make it easier to breathe.  Drink enough fluid to keep your urine clear or pale yellow.   Eat soups and other clear broths and maintain good nutrition.   Rest as needed.   Return to work when  your temperature has returned to normal or as your health care provider advises. You may need to stay home longer to avoid infecting others. You can also use a face mask and careful hand washing to prevent spread of the virus.  Increase the usage of your inhaler if you have asthma.   Do not use any tobacco products, including cigarettes, chewing tobacco, or electronic cigarettes. If you need help quitting, ask your health care provider. PREVENTION  The best way to protect yourself from getting a cold is to practice good hygiene.   Avoid oral or hand contact with people with cold symptoms.   Wash your hands often if contact occurs.  There is no clear evidence that vitamin C, vitamin E, echinacea, or exercise reduces the chance of developing a cold. However, it is always recommended to get plenty of rest, exercise, and practice good nutrition.  SEEK MEDICAL CARE IF:   You are getting worse rather than better.   Your symptoms are not controlled by medicine.   You have chills.  You have worsening shortness of breath.  You have brown or red mucus.  You have yellow or brown nasal discharge.  You have pain in your face, especially when you bend forward.  You have a fever.  You have swollen neck glands.  You have pain while swallowing.  You have white areas in the back of your throat. SEEK IMMEDIATE MEDICAL CARE IF:   You have severe or persistent:  Headache.  Ear pain.  Sinus pain.  Chest pain.  You have chronic lung disease and any of the following:  Wheezing.  Prolonged cough.  Coughing up blood.  A change in your usual mucus.  You have a stiff neck.  You have changes in your:  Vision.  Hearing.  Thinking.  Mood. MAKE SURE YOU:   Understand these instructions.  Will watch your condition.  Will get help right away if you are not doing well or get worse.   This information is not intended to replace advice given to you by your health care  provider. Make sure you discuss any questions you have with your health care provider.   Document Released: 03/23/2001 Document Revised: 02/11/2015 Document Reviewed: 01/02/2014 Elsevier Interactive Patient Education [employer]

## 2015-12-24 ENCOUNTER — Telehealth: Payer: Self-pay | Admitting: *Deleted

## 2015-12-24 DIAGNOSIS — Z20828 Contact with and (suspected) exposure to other viral communicable diseases: Secondary | ICD-10-CM

## 2015-12-24 MED ORDER — OSELTAMIVIR PHOSPHATE 75 MG PO CAPS: 75.0000 mg | ORAL_CAPSULE | Freq: Every day | ORAL | Status: DC

## 2015-12-24 NOTE — Telephone Encounter (Signed)
Patient left voice mail requesting a prescription for Tamiflu to [facility] in [place].  Both of his children have been diagnosed with the flu within the past 24 hours.  Patient did not get a flu vaccine this season.  Please advise.

## 2015-12-24 NOTE — Telephone Encounter (Signed)
Noted. RX sent in for prophylactic treatment.

## 2015-12-24 NOTE — Telephone Encounter (Signed)
Message left for patient to return my call.  

## 2015-12-26 NOTE — Telephone Encounter (Signed)
Message left for patient to return my call.  

## 2016-01-10 ENCOUNTER — Other Ambulatory Visit: Payer: Self-pay | Admitting: Primary Care

## 2016-01-10 DIAGNOSIS — F411 Generalized anxiety disorder: Secondary | ICD-10-CM

## 2016-01-12 NOTE — Telephone Encounter (Signed)
Electronically refill request for   escitalopram (LEXAPRO) 20 MG tablet   Take 1 tablet (20 mg total) by mouth daily.  Dispense: 30 tablet   Refills: 1     Last prescribed on 10/14/2015. Last seen on 12/04/2015. No future appointment.

## 2016-03-04 ENCOUNTER — Encounter: Payer: Self-pay | Admitting: Primary Care

## 2016-03-04 ENCOUNTER — Ambulatory Visit (INDEPENDENT_AMBULATORY_CARE_PROVIDER_SITE_OTHER): Payer: BLUE CROSS/BLUE SHIELD | Admitting: Primary Care

## 2016-03-04 VITALS — BP 124/78 | HR 97 | Temp 98.0°F | Ht 71.0 in | Wt 285.8 lb

## 2016-03-04 DIAGNOSIS — F411 Generalized anxiety disorder: Secondary | ICD-10-CM

## 2016-03-04 MED ORDER — BUSPIRONE HCL 7.5 MG PO TABS: 7.5000 mg | ORAL_TABLET | Freq: Two times a day (BID) | ORAL | Status: DC

## 2016-03-04 NOTE — Assessment & Plan Note (Signed)
Improved on Lexapro 20 mg for several months, now noticing increased anxiety, irritability, difficulty sleeping.  Long discussion today regarding options for treatment. Decided to add in low dose Buspar BID and send to therapy for further evaluation. Continue Lexapro.  Follow up in 6 weeks for further evaluation or sooner if needed.

## 2016-03-04 NOTE — Progress Notes (Signed)
Subjective:    Patient ID: Gerald [name], male    DOB: 04/20/1986, 30 y.o.   MRN: [id number]  HPI  Gerald [name] is a 30 year old male who presents today to discuss anxiety. He was initiated on Lexapro 10 mg in late June 2016 for complaints of anxiety with irritablity, daytime tiredness, difficulty sleeping, and constat worry. He had noticed improvement during follow up visits but did not feel quite at goal in January 2017. His Lexapro was increased to 20 mg at that visit.   Since his last visit he had improvement consistently for for about 3 months. About 1 month ago he's noticed increased anxiety, difficulty falling asleep, and irritablity. His employees have noticed he's not performing as well at work. He will experience days of drowsiness and other days of non drowsiness.   He stopped taking his medication in early March while he was on vacation because he thought he didn't need it, but his family noticed an increase in irritability so he resumed it in late March.  Overall he's improved when compared to 1 year ago, but does continue to experience symptoms mentioned above.  Review of Systems  Respiratory: Negative for shortness of breath.   Cardiovascular: Negative for chest pain.  Neurological: Negative for headaches.  Psychiatric/Behavioral: Positive for sleep disturbance. Negative for suicidal ideas. The patient is nervous/anxious.        Past Medical History  Diagnosis Date  . Lyme disease      Social History   Social History  . Marital Status: Married    Spouse Name: N/A  . Number of Children: N/A  . Years of Education: N/A   Occupational History  . Manager    Social History Main Topics  . Smoking status: Never Smoker   . Smokeless tobacco: Never Used  . Alcohol Use: No  . Drug Use: No  . Sexual Activity: Not on file   Other Topics Concern  . Not on file   Social History Narrative   Agricultural consultantDistrict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 [employer]   Married   2 kids, 3rd child died of  hypoplastic L heart 2013    Past Surgical History  Procedure Laterality Date  . Tonsillectomy and adenoidectomy  ~1991    Family History  Problem Relation Age of Onset  . Drug abuse Mother   . Hypertension Mother   . Diabetes Father     Allergies  Allergen Reactions  . Amoxicillin     REACTION: Hives    Current Outpatient Prescriptions on File Prior to Visit  Medication Sig Dispense Refill  . aspirin-acetaminophen-caffeine (EXCEDRIN MIGRAINE) 250-250-65 MG per tablet Take by mouth every 6 (six) hours as needed for headache. Reported on 10/14/2015    . escitalopram (LEXAPRO) 20 MG tablet TAKE 1 TABLET(20 MG) BY MOUTH DAILY 30 tablet 5  . Multiple Vitamin (MULTIVITAMIN) tablet Take 1 tablet by mouth daily.    . ranitidine (ZANTAC) 150 MG tablet Take 150 mg by mouth 2 (two) times daily.     No current facility-administered medications on file prior to visit.    BP 124/78 mmHg  Pulse 97  Temp(Src) 98 F (36.7 C) (Oral)  Ht 5\' 11"  (1.803 m)  Wt 285 lb 12.8 oz (129.638 kg)  BMI 39.88 kg/m2  SpO2 96%    Objective:   Physical Exam  Constitutional: He appears well-nourished.  Cardiovascular: Normal rate and regular rhythm.   Pulmonary/Chest: Effort normal and breath sounds normal.  Skin: Skin is warm and dry.  Psychiatric: He has a normal mood and affect.          Assessment & Plan:

## 2016-03-04 NOTE — Patient Instructions (Addendum)
Start Buspar (buspiraone) 7.5 mg tablets for anxiety. Take 1 tablet by mouth twice daily.  Continue taking Lexapro for anxiety and depression.  Follow up in 6 weeks for re-evaluation.  It was a pleasure to see you today!

## 2016-03-04 NOTE — Progress Notes (Signed)
Pre visit review using our clinic review tool, if applicable. No additional management support is needed unless otherwise documented below in the visit note. 

## 2016-03-05 ENCOUNTER — Telehealth: Payer: Self-pay | Admitting: Primary Care

## 2016-03-05 NOTE — Telephone Encounter (Signed)
LVM for pt asking to call me back so I can give him info on where we are referring him to. Pt placed on [facility]. They will call him to sch.  Revonda StandardAllison

## 2016-03-18 ENCOUNTER — Emergency Department: Payer: BLUE CROSS/BLUE SHIELD

## 2016-03-18 ENCOUNTER — Encounter: Payer: Self-pay | Admitting: *Deleted

## 2016-03-18 ENCOUNTER — Emergency Department
Admission: EM | Admit: 2016-03-18 | Discharge: 2016-03-18 | Disposition: A | Discharge status: Home / Self Care | Payer: BLUE CROSS/BLUE SHIELD | Attending: Student | Admitting: Student

## 2016-03-18 DIAGNOSIS — K828 Other specified diseases of gallbladder: Secondary | ICD-10-CM | POA: Diagnosis not present

## 2016-03-18 DIAGNOSIS — Z7982 Long term (current) use of aspirin: Secondary | ICD-10-CM | POA: Insufficient documentation

## 2016-03-18 DIAGNOSIS — R109 Unspecified abdominal pain: Secondary | ICD-10-CM | POA: Diagnosis present

## 2016-03-18 DIAGNOSIS — R0789 Other chest pain: Secondary | ICD-10-CM | POA: Insufficient documentation

## 2016-03-18 DIAGNOSIS — R7401 Elevation of levels of liver transaminase levels: Secondary | ICD-10-CM

## 2016-03-18 DIAGNOSIS — Z79899 Other long term (current) drug therapy: Secondary | ICD-10-CM | POA: Diagnosis not present

## 2016-03-18 DIAGNOSIS — R740 Nonspecific elevation of levels of transaminase and lactic acid dehydrogenase [LDH]: Secondary | ICD-10-CM | POA: Diagnosis not present

## 2016-03-18 HISTORY — DX: Anxiety disorder, unspecified: F41.9

## 2016-03-18 LAB — PROTIME-INR
INR: 0.94
PROTHROMBIN TIME: 12.8 s (ref 11.4–15.0)

## 2016-03-18 LAB — COMPREHENSIVE METABOLIC PANEL
ALBUMIN: 4.1 g/dL (ref 3.5–5.0)
ALK PHOS: 96 U/L (ref 38–126)
ALT: 191 U/L — AB (ref 17–63)
AST: 207 U/L — AB (ref 15–41)
Anion gap: 8 (ref 5–15)
BILIRUBIN TOTAL: 0.6 mg/dL (ref 0.3–1.2)
BUN: 11 mg/dL (ref 6–20)
CALCIUM: 8.8 mg/dL — AB (ref 8.9–10.3)
CO2: 23 mmol/L (ref 22–32)
CREATININE: 1.03 mg/dL (ref 0.61–1.24)
Chloride: 107 mmol/L (ref 101–111)
GFR calc Af Amer: >60 mL/min (ref >60)
GLUCOSE: 98 mg/dL (ref 65–99)
POTASSIUM: 3.9 mmol/L (ref 3.5–5.1)
Sodium: 138 mmol/L (ref 135–145)
TOTAL PROTEIN: 7.1 g/dL (ref 6.5–8.1)

## 2016-03-18 LAB — CBC
HCT: 41.8 % (ref 40.0–52.0)
Hemoglobin: 14.3 g/dL (ref 13.0–18.0)
MCH: 27.7 pg (ref 26.0–34.0)
MCHC: 34.3 g/dL (ref 32.0–36.0)
MCV: 80.7 fL (ref 80.0–100.0)
PLATELETS: 249 10*3/uL (ref 150–440)
RBC: 5.18 MIL/uL (ref 4.40–5.90)
RDW: 14.4 % (ref 11.5–14.5)
WBC: 11 10*3/uL — AB (ref 3.8–10.6)

## 2016-03-18 LAB — APTT: APTT: 27 s (ref 24–36)

## 2016-03-18 LAB — TROPONIN I

## 2016-03-18 LAB — LIPASE, BLOOD: Lipase: 24 U/L (ref 11–51)

## 2016-03-18 MED ORDER — IBUPROFEN 400 MG PO TABS: 400.0000 mg | ORAL_TABLET | Freq: Four times a day (QID) | ORAL | Status: DC | PRN

## 2016-03-18 MED ORDER — IBUPROFEN 600 MG PO TABS
600.0000 mg | ORAL_TABLET | Freq: Once | ORAL | Status: AC
  Administered 2016-03-18: 600 mg via ORAL
  Filled 2016-03-18: qty 1

## 2016-03-18 MED ORDER — IOPAMIDOL (ISOVUE-300) INJECTION 61%
125.0000 mL | Freq: Once | INTRAVENOUS | Status: AC | PRN
  Administered 2016-03-18: 125 mL via INTRAVENOUS

## 2016-03-18 MED ORDER — ACETAMINOPHEN 500 MG PO TABS
1000.0000 mg | ORAL_TABLET | Freq: Once | ORAL | Status: AC
  Administered 2016-03-18: 1000 mg via ORAL
  Filled 2016-03-18: qty 2

## 2016-03-18 MED ORDER — DIATRIZOATE MEGLUMINE & SODIUM 66-10 % PO SOLN
15.0000 mL | Freq: Once | ORAL | Status: AC
  Administered 2016-03-18: 15 mL via ORAL

## 2016-03-18 NOTE — ED Provider Notes (Signed)
[facility] Emergency Department Provider Note   ____________________________________________  Time seen: Approximately 7:42 AM  I have reviewed the triage vital signs and the nursing notes.   HISTORY  Chief Complaint Chest pain   HPI Gerald [name] is a 30 y.o. male with history of anxiety who presents for evaluation of atraumatic right pleuritic chest pain that began approximately 1 AM, gradual onset, constant, worse with movement and deep inspiration, no other modifying factors, currently mild. Patient was that he was laying in bed at 1 AM when he had a sharp pain in the right lateral chest which improved when he laid on his right side and worsened when he laid on the left. He reports the pain was initially severe, has been constant, this morning it was much more mild but it was persistent and it concerned him. No nausea, vomiting, diarrhea, fevers or chills. No shortness of breath, no sudden sweating, pain does not radiate to the back or down towards the feet. No syncope. No trauma. **No family history of early coronary artery disease or sudden cardiac death. He denies any hemoptysis, recent surgery, exogenous estrogens, recent prolonged period of immobilization.   Past Medical History  Diagnosis Date  . Lyme disease   . Anxiety     Patient Active Problem List   Diagnosis Date Noted  . Obesity (BMI 30-39.9) 12/04/2015  . Generalized anxiety disorder 04/09/2015  . Lower respiratory tract infection 12/07/2014  . Acute sinusitis 05/12/2014  . Atypical chest pain 01/08/2014  . Family circumstance 09/29/2012  . Hypersomnolence 12/02/2011  . Fatigue 11/09/2011    Past Surgical History  Procedure Laterality Date  . Tonsillectomy and adenoidectomy  ~1991    Current Outpatient Rx  Name  Route  Sig  Dispense  Refill  . aspirin-acetaminophen-caffeine (EXCEDRIN MIGRAINE) 250-250-65 MG per tablet   Oral   Take by mouth every 6 (six) hours as needed for  headache. Reported on 10/14/2015         . busPIRone (BUSPAR) 7.5 MG tablet   Oral   Take 1 tablet (7.5 mg total) by mouth 2 (two) times daily.   60 tablet   3   . escitalopram (LEXAPRO) 20 MG tablet      TAKE 1 TABLET(20 MG) BY MOUTH DAILY   30 tablet   5   . Multiple Vitamin (MULTIVITAMIN) tablet   Oral   Take 1 tablet by mouth daily.           Allergies Amoxicillin  Family History  Problem Relation Age of Onset  . Drug abuse Mother   . Hypertension Mother   . Diabetes Father     Social History Social History  Substance Use Topics  . Smoking status: Never Smoker   . Smokeless tobacco: Never Used  . Alcohol Use: No    Review of Systems Constitutional: No fever/chills Eyes: No visual changes. ENT: No sore throat. Cardiovascular: + chest pain. Respiratory: Denies shortness of breath. Gastrointestinal: No abdominal pain.  No nausea, no vomiting.  No diarrhea.  No constipation. Genitourinary: Negative for dysuria. Musculoskeletal: Negative for back pain. Skin: Negative for rash. Neurological: Negative for headaches, focal weakness or numbness.  10-point ROS otherwise negative.  ____________________________________________   PHYSICAL EXAM:  VITAL SIGNS: ED Triage Vitals  Enc Vitals Group     BP 03/18/16 0731 143/106 mmHg     Pulse Rate 03/18/16 0731 77     Resp 03/18/16 0731 18     Temp 03/18/16 0731  97.5 F (36.4 C)     Temp Source 03/18/16 0731 Oral     SpO2 03/18/16 0731 98 %     Weight 03/18/16 0731 280 lb (127.007 kg)     Height 03/18/16 0731 5\' 10"  (1.778 m)     Head Cir --      Peak Flow --      Pain Score --      Pain Loc --      Pain Edu? --      Excl. in GC? --     Constitutional: Alert and oriented. Well appearing and in no acute distress. Eyes: Conjunctivae are normal. PERRL. EOMI. Head: Atraumatic. Nose: No congestion/rhinnorhea. Mouth/Throat: Mucous membranes are moist.  Oropharynx non-erythematous. Neck: No stridor. Supple  without meningismus.  Cardiovascular: Normal rate, regular rhythm. Grossly normal heart sounds.  Good peripheral circulation. Respiratory: Normal respiratory effort.  No retractions. Lungs CTAB. Gastrointestinal: Soft and nontender. No distention.  No CVA tenderness. Genitourinary: deferred Musculoskeletal: No lower extremity tenderness nor edema.  No joint effusions. No calf swelling/asymmetry/tenderness. Tenderness to palpation in the right lateral chest wall, palpation and deep breaths reproduce the pain. Neurologic:  Normal speech and language. No gross focal neurologic deficits are appreciated. No gait instability. Skin:  Skin is warm, dry and intact. No rash noted. Psychiatric: Mood and affect are normal. Speech and behavior are normal.  ____________________________________________   LABS (all labs ordered are listed, but only abnormal results are displayed)  Labs Reviewed  COMPREHENSIVE METABOLIC PANEL - Abnormal; Notable for the following:    Calcium 8.8 (*)    AST 207 (*)    ALT 191 (*)    All other components within normal limits  CBC - Abnormal; Notable for the following:    WBC 11.0 (*)    All other components within normal limits  LIPASE, BLOOD  TROPONIN I  PROTIME-INR  APTT   ____________________________________________  EKG  ED ECG REPORT I, Gayla Doss, the attending physician, personally viewed and interpreted this ECG.   Date: 03/18/2016  EKG Time: 08:29  Rate: 77  Rhythm: normal sinus rhythm  Axis: Right.  Intervals:none  ST&T Change: No acute ST elevation, no acute ST depression.  ____________________________________________  RADIOLOGY  CXR IMPRESSION: Normal chest x-ray   RUQ ultrasound IMPRESSION: 1. Although no discrete shadowing gallstones are identified within gallbladder. There is positive Wes sign. This may be seen in porcelain gallbladder or contracted gallbladder with multiple stones. Clinical correlation is necessary. Further  evaluation with CT scan may be performed as clinically warranted.  CT abdomen and pelvis IMPRESSION: The recent sonographic appearance of the gallbladder is shown to be related to gallbladder wall calcification ("porcelain gallbladder"), which is associated with an increased risk of gallbladder carcinoma.  ____________________________________________   PROCEDURES  Procedure(s) performed: None  Critical Care performed: No  ____________________________________________   INITIAL IMPRESSION / ASSESSMENT AND PLAN / ED COURSE  Pertinent labs & imaging results that were available during my care of the patient were reviewed by me and considered in my medical decision making (see chart for details).  Gerald [name] is a 30 y.o. male with history of anxiety who presents for evaluation of atraumatic right pleuritic chest pain that began approximately 1 AM and has been constant since onset, worse with movement and deep inspiration. On exam, he is very well-appearing and in no acute distress, his vital signs are stable, he is afebrile. He does have tenderness to palpation throughout the right lateral chest wall and  I suspect that his pain is most skeletal in nature. He does not appear to have any tenderness to palpation in the right upper quadrant or throughout the abdomen. We will obtain screening labs, chest x-ray, reassess for disposition. Pain would be atypical for ACS but we will obtain one set of cardiac enzymes. We'll treat with Motrin and Tylenol, he declined anything else for pain stating that his pain is minimal. PERC negative and I doubt PE. Not consistent with acute aortic dissection.  ----------------------------------------- 12:31 PM on 03/18/2016 ----------------------------------------- Patient is resting comfortably, he is sleeping but awakens to voice and has no complaints. Negative troponin, generally unremarkable CBC. CMP with AST and ALT elevations, 207 and 191 respectively, no T  bili elevation, normal lipase, normal coags, patient denies any recent tylenol ingestion. Right upper quadrant ultrasound was obtained and the recommendation was to obtain a CT of the abdomen and pelvis which shows a porcelain gallbladder. I discussed the case with Dr. Excell Seltzerooper of Gen. surgery who recommends outpatient nonemergent cholecystectomy. I discussed this with the patient, we discussed return precautions, need for close follow-up with primary care doctor as well as for recheck of his LFTs and the patient is wife are comfortable with the discharge plan. DC home.  ____________________________________________   FINAL CLINICAL IMPRESSION(S) / ED DIAGNOSES  Final diagnoses:  Acute chest wall pain  Transaminitis  Porcelain gallbladder      NEW MEDICATIONS STARTED DURING THIS VISIT:  New Prescriptions   No medications on file     Note:  This document was prepared using Dragon voice recognition software and may include unintentional dictation errors.    Gayla DossEryka A Mardy Hoppe, MD 03/18/16 1233

## 2016-03-18 NOTE — Discharge Instructions (Signed)
You were seen in the emergency department today and were found to have elevation of your liver function tests as well as a porcelain gallbladder which will likely require surgery. Return immediately to the emergency department if you develop severe or worsening pain, vomiting, diarrhea, fevers, chills, inability to have a bowel movement, new or worsening chest pain or difficulty breathing or for any other concerns.

## 2016-03-18 NOTE — ED Notes (Signed)
AAOx3.  Skin warm and dry.  NAD 

## 2016-03-18 NOTE — ED Notes (Signed)
Pt complains of right sided abdominal pain at 0100, pt denies any other symptoms

## 2016-03-22 ENCOUNTER — Other Ambulatory Visit: Payer: Self-pay

## 2016-03-24 ENCOUNTER — Encounter: Payer: Self-pay | Admitting: General Surgery

## 2016-03-24 ENCOUNTER — Ambulatory Visit (INDEPENDENT_AMBULATORY_CARE_PROVIDER_SITE_OTHER): Payer: BLUE CROSS/BLUE SHIELD | Admitting: General Surgery

## 2016-03-24 VITALS — BP 137/82 | HR 81 | Temp 96.5°F | Ht 71.0 in | Wt 281.0 lb

## 2016-03-24 DIAGNOSIS — K828 Other specified diseases of gallbladder: Secondary | ICD-10-CM | POA: Diagnosis not present

## 2016-03-24 NOTE — Progress Notes (Signed)
Patient ID: Gerald NordmannNicholas [name], male   DOB: 08/18/1986, 30 y.o.   MRN: [id number]  CC: Porcelain gallbladder  HPI Gerald Nordmannicholas Kavanagh is a 30 y.o. male who presents to clinic for evaluation after being seen in the emergency department for atypical right lower chest pain. Patient reports that last week use in the emergency department after a night of pain to his right lower, lateral chest. He also had numerous bowel movements that night, had associated chills, nausea. He does have a significant history of anxiety. Since that time he denies any fevers, chills, shortness of breath, chest pain, abdominal pain, constipation, diarrhea. Prior to this tachycardia is never had anything like this. Upon workup in the emergency department cardiac and pulmonary causes were ruled out and images showed evidence of a porcelain gallbladder which she is here today to discuss removal of.  HPI  Past Medical History  Diagnosis Date  . Lyme disease   . Anxiety     Past Surgical History  Procedure Laterality Date  . Tonsillectomy and adenoidectomy  ~1991    Family History  Problem Relation Age of Onset  . Drug abuse Mother   . Hypertension Mother   . Diabetes Father     Social History Social History  Substance Use Topics  . Smoking status: Never Smoker   . Smokeless tobacco: Never Used  . Alcohol Use: No    Allergies  Allergen Reactions  . Amoxicillin Hives    Has patient had a PCN reaction causing immediate rash, facial/tongue/throat swelling, SOB or lightheadedness with hypotension: Yes Has patient had a PCN reaction causing severe rash involving mucus membranes or skin necrosis: Yes Has patient had a PCN reaction that required hospitalization No Has patient had a PCN reaction occurring within the last 10 years: No If all of the above answers are "NO", then may proceed with Cephalosporin use.     Current Outpatient Prescriptions  Medication Sig Dispense Refill  . aspirin-acetaminophen-caffeine (EXCEDRIN  MIGRAINE) 250-250-65 MG per tablet Take by mouth every 6 (six) hours as needed for headache. Reported on 10/14/2015    . busPIRone (BUSPAR) 7.5 MG tablet Take 1 tablet (7.5 mg total) by mouth 2 (two) times daily. 60 tablet 3  . escitalopram (LEXAPRO) 20 MG tablet TAKE 1 TABLET(20 MG) BY MOUTH DAILY 30 tablet 5  . ibuprofen (ADVIL,MOTRIN) 400 MG tablet Take 1 tablet (400 mg total) by mouth every 6 (six) hours as needed for moderate pain. 20 tablet 0  . Multiple Vitamin (MULTIVITAMIN) tablet Take 1 tablet by mouth daily.     No current facility-administered medications for this visit.     Review of Systems A Multi-point review of systems was asked and was negative except for the findings documented in the history of present illness  Physical Exam Blood pressure 137/82, pulse 81, temperature 96.5 F (35.8 C), temperature source Oral, height 5\' 11"  (1.803 m), weight 127.461 kg (281 lb). CONSTITUTIONAL: No acute distress. EYES: Pupils are equal, round, and reactive to light, Sclera are non-icteric. EARS, NOSE, MOUTH AND THROAT: The oropharynx is clear. The oral mucosa is pink and moist. Hearing is intact to voice. LYMPH NODES:  Lymph nodes in the neck are normal. RESPIRATORY:  Lungs are clear. There is normal respiratory effort, with equal breath sounds bilaterally, and without pathologic use of accessory muscles. CARDIOVASCULAR: Heart is regular without murmurs, gallops, or rubs. GI: The abdomen is soft, nontender, and nondistended. There are no palpable masses. There is no hepatosplenomegaly. There are normal bowel  sounds in all quadrants. GU: Rectal deferred.   MUSCULOSKELETAL: Normal muscle strength and tone. No cyanosis or edema.   SKIN: Turgor is good and there are no pathologic skin lesions or ulcers. NEUROLOGIC: Motor and sensation is grossly normal. Cranial nerves are grossly intact. PSYCH:  Oriented to person, place and time. Affect is normal.  Data Reviewed Images and labs reviewed.  Labs are primarily within normal limits except for a very mild elevation white blood cell count of 11,000, and a mild transaminitis of AST 207 and ALT of 191. All other labs are within normal limits. Ultrasound and CT of the abdomen and pelvis show a calcified gallbladder wall consistent with a porcelain gallbladder. I have personally reviewed the patient's imaging, laboratory findings and medical records.    Assessment    30 year old male with porcelain gallbladder    Plan    Discussed with the patient the atypical nature of his symptoms however also discussed the diagnosis of a porcelain gallbladder. The procedure of laparoscopic cholecystectomy was discussed in detail.I discussed the procedure in detail.  The patient was given Agricultural engineereducational material.  We discussed the risks and benefits of a laparoscopic cholecystectomy and possible cholangiogram including, but not limited to bleeding, infection, injury to surrounding structures such as the intestine or liver, bile leak, retained gallstones, need to convert to an open procedure, prolonged diarrhea, blood clots such as  DVT, common bile duct injury, anesthesia risks, and possible need for additional procedures.  The likelihood of improvement in symptoms and return to the patient's normal status is good. We discussed the typical post-operative recovery course. Patient voiced understanding and wishes to proceed. Plan for lap scopic cholecystectomy on June 28      Time spent with the patient was 55 minutes, with more than 50% of the time spent in face-to-face education, counseling and care coordination.     Ricarda Frameharles Nathanyal Ashmead, MD FACS General Surgeon 03/24/2016, 2:07 PM

## 2016-03-24 NOTE — Patient Instructions (Addendum)
You have requested to have your Gallbladder removed. We will arrange this to be done on 04/07/16 at [facility] with Dr. Ricarda Frameharles Woodham.  You will be off from work for approximately 1-2 weeks depending on your recovery.   Please avoid greasy and fried foods if at all possible prior to your scheduled surgery to decrease symptoms until then.  Please see the [facility] pre-care form you have been given today.  If you have any questions or concerns please call our office.

## 2016-03-25 ENCOUNTER — Telehealth: Payer: Self-pay | Admitting: General Surgery

## 2016-03-25 NOTE — Telephone Encounter (Signed)
Pt advised of pre op date/time and sx date. Sx: 04/07/16 with Dr Devoria AlbeWoodham--Laparoscopic cholecystectomy.  Pre op: 03/31/16 between 9-1:00pm--Phone.   Patient made aware to call [phone], between 1-3:00pm the day before surgery, to find out what time to arrive.

## 2016-03-26 ENCOUNTER — Telehealth: Payer: Self-pay | Admitting: General Surgery

## 2016-03-26 NOTE — Telephone Encounter (Signed)
Patient has been made aware of the change of surgery date with Dr Tonita CongWoodham. 04/08/16. Patient is ok with date change.

## 2016-03-31 ENCOUNTER — Other Ambulatory Visit: Payer: BLUE CROSS/BLUE SHIELD

## 2016-04-07 ENCOUNTER — Encounter: Payer: Self-pay | Admitting: *Deleted

## 2016-04-07 NOTE — Patient Instructions (Signed)
  Your procedure is scheduled on: 04/08/16 Report to Day Surgery. [facility]OND FLOOR To find out your arrival time please call [phone] between 1PM - 3PM on 04/07/16  Remember: Instructions that are not followed completely may result in serious medical risk, up to and including death, or upon the discretion of your surgeon and anesthesiologist your surgery may need to be rescheduled.    ___X_ 1. Do not eat food or drink liquids after midnight. No gum chewing or hard candies.     __X__ 2. No Alcohol for 24 hours before or after surgery.   __X__ 3. Do Not Smoke For 24 Hours Prior to Your Surgery.   ____ 4. Bring all medications with you on the day of surgery if instructed.    __X__ 5. Notify your doctor if there is any change in your medical condition     (cold, fever, infections).       Do not wear jewelry, make-up, hairpins, clips or nail polish.  Do not wear lotions, powders, or perfumes. You may wear deodorant.  Do not shave 48 hours prior to surgery. Men may shave face and neck.  Do not bring valuables to the hospital.    [facility] is not responsible for any belongings or valuables.               Contacts, dentures or bridgework may not be worn into surgery.  Leave your suitcase in the car. After surgery it may be brought to your room.  For patients admitted to the hospital, discharge time is determined by your                treatment team.   Patients discharged the day of surgery will not be allowed to drive home.   Please read over the following fact sheets that you were given:   Surgical Site Infection Prevention   ____ Take these medicines the morning of surgery with A SIP OF WATER:    1. NONE  2.   3.   4.  5.  6.  ____ Fleet Enema (as directed)   ____ Use CHG Soap as directed  ____ Use inhalers on the day of surgery  ____ Stop metformin 2 days prior to surgery    ____ Take 1/2 of usual insulin dose the night before surgery and none on the  morning of surgery.   ____ Stop Coumadin/Plavix/aspirin on  ____ Stop Anti-inflammatories on   ____ Stop supplements until after surgery.    ____ Bring C-Pap to the hospital.

## 2016-04-08 ENCOUNTER — Encounter
Admission: RE | Disposition: A | Discharge status: Home / Self Care | Payer: Self-pay | Source: Ambulatory Visit | Attending: General Surgery

## 2016-04-08 ENCOUNTER — Ambulatory Visit
Admission: RE | Admit: 2016-04-08 | Discharge: 2016-04-08 | Disposition: A | Discharge status: Home / Self Care | Payer: BLUE CROSS/BLUE SHIELD | Source: Ambulatory Visit | Attending: General Surgery | Admitting: General Surgery

## 2016-04-08 ENCOUNTER — Encounter: Payer: Self-pay | Admitting: *Deleted

## 2016-04-08 ENCOUNTER — Ambulatory Visit: Payer: BLUE CROSS/BLUE SHIELD | Admitting: Anesthesiology

## 2016-04-08 ENCOUNTER — Ambulatory Visit: Payer: BLUE CROSS/BLUE SHIELD

## 2016-04-08 ENCOUNTER — Telehealth: Payer: Self-pay | Admitting: General Surgery

## 2016-04-08 DIAGNOSIS — A6920 Lyme disease, unspecified: Secondary | ICD-10-CM | POA: Insufficient documentation

## 2016-04-08 DIAGNOSIS — K8010 Calculus of gallbladder with chronic cholecystitis without obstruction: Secondary | ICD-10-CM | POA: Insufficient documentation

## 2016-04-08 DIAGNOSIS — K219 Gastro-esophageal reflux disease without esophagitis: Secondary | ICD-10-CM | POA: Diagnosis not present

## 2016-04-08 DIAGNOSIS — F419 Anxiety disorder, unspecified: Secondary | ICD-10-CM | POA: Diagnosis not present

## 2016-04-08 DIAGNOSIS — Z881 Allergy status to other antibiotic agents status: Secondary | ICD-10-CM | POA: Diagnosis not present

## 2016-04-08 DIAGNOSIS — K8020 Calculus of gallbladder without cholecystitis without obstruction: Secondary | ICD-10-CM | POA: Insufficient documentation

## 2016-04-08 DIAGNOSIS — Z79899 Other long term (current) drug therapy: Secondary | ICD-10-CM | POA: Diagnosis not present

## 2016-04-08 HISTORY — PX: CHOLECYSTECTOMY: SHX55

## 2016-04-08 HISTORY — DX: Adverse effect of unspecified anesthetic, initial encounter: T41.45XA

## 2016-04-08 HISTORY — DX: Other complications of anesthesia, initial encounter: T88.59XA

## 2016-04-08 HISTORY — DX: Failed or difficult intubation, initial encounter: T88.4XXA

## 2016-04-08 HISTORY — DX: Headache: R51

## 2016-04-08 HISTORY — DX: Headache, unspecified: R51.9

## 2016-04-08 SURGERY — LAPAROSCOPIC CHOLECYSTECTOMY WITH INTRAOPERATIVE CHOLANGIOGRAM
Anesthesia: General | Wound class: Clean Contaminated

## 2016-04-08 MED ORDER — LIDOCAINE HCL (CARDIAC) 20 MG/ML IV SOLN
INTRAVENOUS | Status: DC | PRN
  Administered 2016-04-08: 100 mg via INTRAVENOUS

## 2016-04-08 MED ORDER — ACETAMINOPHEN 10 MG/ML IV SOLN
INTRAVENOUS | Status: AC
Start: 2016-04-08 — End: 2016-04-08
  Filled 2016-04-08: qty 100

## 2016-04-08 MED ORDER — LIDOCAINE HCL 1 % IJ SOLN
INTRAMUSCULAR | Status: DC | PRN
  Administered 2016-04-08: 30 mL via SUBCUTANEOUS

## 2016-04-08 MED ORDER — METRONIDAZOLE IN NACL 5-0.79 MG/ML-% IV SOLN
500.0000 mg | INTRAVENOUS | Status: AC
  Administered 2016-04-08: 500 mg via INTRAVENOUS
  Filled 2016-04-08: qty 100

## 2016-04-08 MED ORDER — OXYCODONE HCL 5 MG PO TABS
ORAL_TABLET | ORAL | Status: DC
Start: 2016-04-08 — End: 2016-04-08
  Filled 2016-04-08: qty 1

## 2016-04-08 MED ORDER — ONDANSETRON HCL 4 MG/2ML IJ SOLN
INTRAMUSCULAR | Status: DC | PRN
  Administered 2016-04-08: 4 mg via INTRAVENOUS

## 2016-04-08 MED ORDER — SUGAMMADEX SODIUM 200 MG/2ML IV SOLN
INTRAVENOUS | Status: DC | PRN
  Administered 2016-04-08: 250 mg via INTRAVENOUS

## 2016-04-08 MED ORDER — BUPIVACAINE HCL (PF) 0.5 % IJ SOLN
INTRAMUSCULAR | Status: AC
  Filled 2016-04-08: qty 30

## 2016-04-08 MED ORDER — LIDOCAINE HCL (PF) 1 % IJ SOLN
INTRAMUSCULAR | Status: AC
  Filled 2016-04-08: qty 30

## 2016-04-08 MED ORDER — FENTANYL CITRATE (PF) 100 MCG/2ML IJ SOLN: 25.0000 ug | INTRAMUSCULAR | Status: DC | PRN

## 2016-04-08 MED ORDER — GLYCOPYRROLATE 0.2 MG/ML IJ SOLN
INTRAMUSCULAR | Status: DC | PRN
  Administered 2016-04-08: 0.2 mg via INTRAVENOUS

## 2016-04-08 MED ORDER — OXYCODONE HCL 5 MG PO TABS
5.0000 mg | ORAL_TABLET | Freq: Once | ORAL | Status: AC | PRN
  Administered 2016-04-08: 5 mg via ORAL

## 2016-04-08 MED ORDER — EPHEDRINE SULFATE 50 MG/ML IJ SOLN
INTRAMUSCULAR | Status: DC | PRN
  Administered 2016-04-08: 10 mg via INTRAVENOUS

## 2016-04-08 MED ORDER — ROCURONIUM BROMIDE 10 MG/ML (PF) SYRINGE: PREFILLED_SYRINGE | INTRAVENOUS | Status: DC | PRN

## 2016-04-08 MED ORDER — PROPOFOL 10 MG/ML IV BOLUS
INTRAVENOUS | Status: DC | PRN
  Administered 2016-04-08: 200 mg via INTRAVENOUS

## 2016-04-08 MED ORDER — FAMOTIDINE 20 MG PO TABS
20.0000 mg | ORAL_TABLET | Freq: Once | ORAL | Status: AC
  Administered 2016-04-08: 20 mg via ORAL

## 2016-04-08 MED ORDER — CHLORHEXIDINE GLUCONATE CLOTH 2 % EX PADS: 6.0000 | MEDICATED_PAD | Freq: Once | CUTANEOUS | Status: DC

## 2016-04-08 MED ORDER — PHENYLEPHRINE HCL 10 MG/ML IJ SOLN
INTRAMUSCULAR | Status: DC | PRN
  Administered 2016-04-08 (×2): 100 ug via INTRAVENOUS

## 2016-04-08 MED ORDER — OXYCODONE HCL 5 MG/5ML PO SOLN: 5.0000 mg | Freq: Once | ORAL | Status: AC | PRN

## 2016-04-08 MED ORDER — LACTATED RINGERS IV SOLN
INTRAVENOUS | Status: DC
  Administered 2016-04-08: 06:00:00 via INTRAVENOUS

## 2016-04-08 MED ORDER — ONDANSETRON HCL 4 MG/2ML IJ SOLN
INTRAMUSCULAR | Status: AC
  Administered 2016-04-08: 4 mg
  Filled 2016-04-08: qty 2

## 2016-04-08 MED ORDER — ACETAMINOPHEN 10 MG/ML IV SOLN
INTRAVENOUS | Status: DC | PRN
  Administered 2016-04-08: 1000 mg via INTRAVENOUS

## 2016-04-08 MED ORDER — KETAMINE HCL 50 MG/ML IJ SOLN
INTRAMUSCULAR | Status: DC | PRN
  Administered 2016-04-08: 50 mg via INTRAVENOUS

## 2016-04-08 MED ORDER — HYDROCODONE-ACETAMINOPHEN 5-325 MG PO TABS: 1.0000 | ORAL_TABLET | Freq: Four times a day (QID) | ORAL | Status: DC | PRN

## 2016-04-08 MED ORDER — FENTANYL CITRATE (PF) 100 MCG/2ML IJ SOLN
INTRAMUSCULAR | Status: DC | PRN
  Administered 2016-04-08: 250 ug via INTRAVENOUS
  Administered 2016-04-08: 100 ug via INTRAVENOUS

## 2016-04-08 MED ORDER — ROCURONIUM BROMIDE 100 MG/10ML IV SOLN
INTRAVENOUS | Status: DC | PRN
  Administered 2016-04-08: 50 mg via INTRAVENOUS
  Administered 2016-04-08: 20 mg via INTRAVENOUS
  Administered 2016-04-08: 10 mg via INTRAVENOUS

## 2016-04-08 MED ORDER — MIDAZOLAM HCL 2 MG/2ML IJ SOLN
INTRAMUSCULAR | Status: DC | PRN
  Administered 2016-04-08: 2 mg via INTRAVENOUS

## 2016-04-08 MED ORDER — CIPROFLOXACIN IN D5W 400 MG/200ML IV SOLN
400.0000 mg | INTRAVENOUS | Status: AC
  Administered 2016-04-08: 400 mg via INTRAVENOUS

## 2016-04-08 MED ORDER — ONDANSETRON 4 MG PO TBDP: 4.0000 mg | ORAL_TABLET | Freq: Three times a day (TID) | ORAL | Status: DC | PRN

## 2016-04-08 MED ORDER — FAMOTIDINE 20 MG PO TABS
ORAL_TABLET | ORAL | Status: AC
  Filled 2016-04-08: qty 1

## 2016-04-08 MED ORDER — CIPROFLOXACIN IN D5W 400 MG/200ML IV SOLN
INTRAVENOUS | Status: AC
  Filled 2016-04-08: qty 200

## 2016-04-08 SURGICAL SUPPLY — 48 items
APPLIER CLIP ROT 10 11.4 M/L (STAPLE) ×3
BAG COUNTER SPONGE EZ (MISCELLANEOUS) IMPLANT
BLADE SURG SZ11 CARB STEEL (BLADE) ×3 IMPLANT
BULB RESERV EVAC DRAIN JP 100C (MISCELLANEOUS) IMPLANT
CANISTER SUCT 1200ML W/VALVE (MISCELLANEOUS) ×3 IMPLANT
CATH CHOLANG 76X19 KUMAR (CATHETERS) ×3 IMPLANT
CHLORAPREP W/TINT 26ML (MISCELLANEOUS) ×3 IMPLANT
CLIP APPLIE ROT 10 11.4 M/L (STAPLE) ×1 IMPLANT
CLOSURE WOUND 1/2 X4 (GAUZE/BANDAGES/DRESSINGS) ×1
CONRAY 60ML FOR OR (MISCELLANEOUS) ×3 IMPLANT
COUNTER SPONGE BAG EZ (MISCELLANEOUS)
DISSECTOR KITTNER STICK (MISCELLANEOUS) IMPLANT
DISSECTORS/KITTNER STICK (MISCELLANEOUS)
DRAIN CHANNEL JP 19F (MISCELLANEOUS) IMPLANT
DRAPE SHEET LG 3/4 BI-LAMINATE (DRAPES) ×3 IMPLANT
DRESSING TELFA 4X3 1S ST N-ADH (GAUZE/BANDAGES/DRESSINGS) ×3 IMPLANT
DRSG TEGADERM 2-3/8X2-3/4 SM (GAUZE/BANDAGES/DRESSINGS) ×12 IMPLANT
ELECT REM PT RETURN 9FT ADLT (ELECTROSURGICAL) ×3
ELECTRODE REM PT RTRN 9FT ADLT (ELECTROSURGICAL) ×1 IMPLANT
GAUZE SPONGE 4X4 12PLY STRL (GAUZE/BANDAGES/DRESSINGS) ×3 IMPLANT
GLOVE BIO SURGEON STRL SZ7.5 (GLOVE) ×18 IMPLANT
GLOVE INDICATOR 8.0 STRL GRN (GLOVE) ×3 IMPLANT
GOWN STRL REUS W/ TWL LRG LVL3 (GOWN DISPOSABLE) ×3 IMPLANT
GOWN STRL REUS W/TWL LRG LVL3 (GOWN DISPOSABLE) ×6
IRRIGATION STRYKERFLOW (MISCELLANEOUS) IMPLANT
IRRIGATOR STRYKERFLOW (MISCELLANEOUS)
IV NS 1000ML (IV SOLUTION)
IV NS 1000ML BAXH (IV SOLUTION) IMPLANT
L-HOOK LAP DISP 36CM (ELECTROSURGICAL) ×3
LABEL OR SOLS (LABEL) ×3 IMPLANT
LHOOK LAP DISP 36CM (ELECTROSURGICAL) ×1 IMPLANT
NEEDLE HYPO 25X1 1.5 SAFETY (NEEDLE) ×3 IMPLANT
NEEDLE VERESS 14GA 120MM (NEEDLE) ×3 IMPLANT
NS IRRIG 500ML POUR BTL (IV SOLUTION) ×3 IMPLANT
PACK LAP CHOLECYSTECTOMY (MISCELLANEOUS) ×3 IMPLANT
PENCIL ELECTRO HAND CTR (MISCELLANEOUS) ×3 IMPLANT
POUCH ENDO CATCH 10MM SPEC (MISCELLANEOUS) ×3 IMPLANT
SCISSORS METZENBAUM CVD 33 (INSTRUMENTS) ×3 IMPLANT
SLEEVE ENDOPATH XCEL 5M (ENDOMECHANICALS) ×6 IMPLANT
STRIP CLOSURE SKIN 1/2X4 (GAUZE/BANDAGES/DRESSINGS) ×2 IMPLANT
SUT MNCRL 4-0 (SUTURE) ×4
SUT MNCRL 4-0 27XMFL (SUTURE) ×2
SUT VICRYL 0 AB UR-6 (SUTURE) ×3 IMPLANT
SUTURE MNCRL 4-0 27XMF (SUTURE) ×2 IMPLANT
SWABSTK COMLB BENZOIN TINCTURE (MISCELLANEOUS) ×3 IMPLANT
TROCAR XCEL 12X100 BLDLESS (ENDOMECHANICALS) ×3 IMPLANT
TROCAR XCEL NON-BLD 5MMX100MML (ENDOMECHANICALS) ×3 IMPLANT
TUBING INSUFFLATOR HI FLOW (MISCELLANEOUS) ×3 IMPLANT

## 2016-04-08 NOTE — Telephone Encounter (Signed)
Patient would like the nurse to call him. He had LAPAROSCOPIC CHOLECYSTECTOMY with Dr Tonita CongWoodham today 04/08/16. He is requesting a prescription for nausea caused by the pain medication. He would also like a doctor's note for work with an estimated return date. Please call at your convenience.

## 2016-04-08 NOTE — Anesthesia Preprocedure Evaluation (Signed)
Anesthesia Evaluation  Patient identified by MRN, date of birth, ID band Patient awake    Reviewed: Allergy & Precautions, H&P , NPO status , Patient's Chart, lab work & pertinent test results  Airway Mallampati: III  TM Distance: <3 FB Neck ROM: full    Dental  (+) Poor Dentition   Pulmonary neg shortness of breath,    Pulmonary exam normal breath sounds clear to auscultation       Cardiovascular Exercise Tolerance: Good (-) angina(-) Past MI and (-) DOE negative cardio ROS Normal cardiovascular exam Rhythm:regular Rate:Normal     Neuro/Psych  Headaches, PSYCHIATRIC DISORDERS Anxiety    GI/Hepatic Neg liver ROS, GERD  Controlled,  Endo/Other  negative endocrine ROS  Renal/GU negative Renal ROS  negative genitourinary   Musculoskeletal   Abdominal   Peds  Hematology negative hematology ROS (+)   Anesthesia Other Findings Past Medical History:   Lyme disease                                                 Anxiety                                                      Headache                                                       Comment:migraiones  Past Surgical History:   TONSILLECTOMY AND ADENOIDECTOMY                  ~1991       BMI    Body Mass Index   39.20 kg/m 2    Signs and symptoms suggestive of sleep apnea   Requested that patient remove contact lenses to prevent corneal abrasion    Reproductive/Obstetrics negative OB ROS                             Anesthesia Physical Anesthesia Plan  ASA: III  Anesthesia Plan: General ETT   Post-op Pain Management:    Induction:   Airway Management Planned:   Additional Equipment:   Intra-op Plan:   Post-operative Plan:   Informed Consent: I have reviewed the patients History and Physical, chart, labs and discussed the procedure including the risks, benefits and alternatives for the proposed anesthesia with the patient or  authorized representative who has indicated his/her understanding and acceptance.   Dental Advisory Given  Plan Discussed with: Anesthesiologist, CRNA and Surgeon  Anesthesia Plan Comments:         Anesthesia Quick Evaluation

## 2016-04-08 NOTE — Telephone Encounter (Signed)
Requesting nausea medication to help with nausea from pain medication. Pt has been eating a meal prior to taking medication but still experiencing nausea. Zofran sent to preferred pharmacy at this time.  Would like a work note estimating his time that he will need to be out of work. He would like this sent to: [employer]- [phone] (Attn: Vilinda BoehringerMatt Kowatch).  Faxed over with positive confirmation at this time.

## 2016-04-08 NOTE — Op Note (Signed)
Laparoscopic Cholecystectomy  Pre-operative Diagnosis: Porcelain gallbladder  Post-operative Diagnosis: Cholelithiasis  Procedure: Laparoscopic cholecystectomy  Surgeon: Leonette Mostharles T. Tonita CongWoodham, MD FACS  Anesthesia: Gen. with endotracheal tube  Assistant: None  Procedure Details  The patient was seen again in the Holding Room. The benefits, complications, treatment options, and expected outcomes were discussed with the patient. The risks of bleeding, infection, recurrence of symptoms, failure to resolve symptoms, bile duct damage, bile duct leak, retained common bile duct stone, bowel injury, any of which could require further surgery and/or ERCP, stent, or papillotomy were reviewed with the patient. The likelihood of improving the patient's symptoms with return to their baseline status is good.  The patient and/or family concurred with the proposed plan, giving informed consent.  The patient was taken to Operating Room, identified as Gerald [name] and the procedure verified as Laparoscopic Cholecystectomy.  A Time Out was held and the above information confirmed.  Prior to the induction of general anesthesia, antibiotic prophylaxis was administered. VTE prophylaxis was in place. General endotracheal anesthesia was then administered and tolerated well. After the induction, the abdomen was prepped with Chloraprep and draped in the sterile fashion. The patient was positioned in the supine position.  Local anesthetic  was injected into the skin near the umbilicus and an incision made. The Veress needle was placed. Pneumoperitoneum was then created with CO2 and tolerated well without any adverse changes in the patient's vital signs. A 5mm port was placed in the periumbilical position and the abdominal cavity was explored.  Two 5-mm ports were placed in the right upper quadrant and a 12 mm epigastric port was placed all under direct vision. All skin incisions  were infiltrated with a local anesthetic agent  before making the incision and placing the trocars.   The patient was positioned  in reverse Trendelenburg, tilted slightly to the patient's left.  The gallbladder was identified, the fundus grasped and retracted cephalad. Adhesions were lysed bluntly. The infundibulum was grasped and retracted laterally, exposing the peritoneum overlying the triangle of Calot. This was then divided and exposed in a blunt fashion. A critical view of the cystic duct and cystic artery was obtained.  The cystic duct was clearly identified and bluntly dissected.   The duct and artery were able be clearly visualized and clipped at the same time. Using a cold wire 3 clips were placed on each tube in the proximal stay while the disorder. They were cut between clips sharply with endoscopic shears.  The gallbladder was taken from the gallbladder fossa in a retrograde fashion with the electrocautery. The gallbladder was removed and placed in an Endocatch bag. The liver bed was irrigated and inspected. Hemostasis was achieved with the electrocautery. Copious irrigation was utilized and was repeatedly aspirated until clear.  The gallbladder and Endocatch sac were then removed through the epigastric port site. The bag ruptured upon removal through the fascia secondary to large stones. These were able to be removed.  Inspection of the right upper quadrant was performed. No bleeding, bile duct injury or leak, or bowel injury was noted. Pneumoperitoneum was released.  The epigastric port site was closed with figure-of-eight 0 Vicryl sutures. 4-0 subcuticular Monocryl was used to close the skin. Steristrips and Mastisol and sterile dressings were  applied.  The patient was then extubated and brought to the recovery room in stable condition. Sponge, lap, and needle counts were correct at closure and at the conclusion of the case.   Findings: Large cholelithiasis  Estimated Blood Loss:  20 mL         Drains: None         Specimens:  Gallbladder           Complications: none               Talyia Allende T. Tonita CongWoodham, MD, FACS

## 2016-04-08 NOTE — Discharge Instructions (Signed)
Laparoscopic Cholecystectomy, Care After Refer to this sheet in the next few weeks. These instructions provide you with information about caring for yourself after your procedure. Your health care provider may also give you more specific instructions. Your treatment has been planned according to current medical practices, but problems sometimes occur. Call your health care provider if you have any problems or questions after your procedure. WHAT TO EXPECT AFTER THE PROCEDURE After your procedure, it is common to have:  Pain at your incision sites. You will be given pain medicines to control your pain.  Mild nausea or vomiting. This should improve after the first 24 hours.  Bloating and possible shoulder pain from the gas that was used during the procedure. This will improve after the first 24 hours. HOME CARE INSTRUCTIONS Incision Care  Follow instructions from your health care provider about how to take care of your incisions. Make sure you:  Wash your hands with soap and water before you change your bandage (dressing). If soap and water are not available, use hand sanitizer.  Change your dressing as told by your health care provider.  Leave stitches (sutures), skin glue, or adhesive strips in place. These skin closures may need to be in place for 2 weeks or longer. If adhesive strip edges start to loosen and curl up, you may trim the loose edges. Do not remove adhesive strips completely unless your health care provider tells you to do that. (May remove cover dressing in 48 hours, do not remove strips. Replace cover dressing as needed until drainage stops)  Do not take baths, swim, or use a hot tub until your health care provider approves. Ask your health care provider if you can take showers. You may only be allowed to take sponge baths for bathing. (OK TO SHOWER IN 24 HOURS) General Instructions  Take over-the-counter and prescription medicines only as told by your health care  provider.  Do not drive or operate heavy machinery while taking prescription pain medicine.  Return to your normal diet as told by your health care provider.  Do not lift anything that is heavier than 10 lb (4.5 kg).  Do not play contact sports for one week or until your health care provider approves. SEEK MEDICAL CARE IF:   You have redness, swelling, or pain at the site of your incision.  You have fluid, blood, or pus coming from your incision.  You notice a bad smell coming from your incision area.  Your surgical incisions break open.  You have a fever. SEEK IMMEDIATE MEDICAL CARE IF:  You develop a rash.  You have difficulty breathing.  You have chest pain.  You have increasing pain in your shoulders (shoulder strap areas).  You faint or have dizzy episodes while you are standing.  You have severe pain in your abdomen.  You have nausea or vomiting that lasts for more than one day.   This information is not intended to replace advice given to you by your health care provider. Make sure you discuss any questions you have with your health care provider.   AMBULATORY SURGERY  DISCHARGE INSTRUCTIONS   1) The drugs that you were given will stay in your system until tomorrow so for the next 24 hours you should not:  A) Drive an automobile B) Make any legal decisions C) Drink any alcoholic beverage   2) You may resume regular meals tomorrow.  Today it is better to start with liquids and gradually work up to solid foods.  You may eat anything you prefer, but it is better to start with liquids, then soup and crackers, and gradually work up to solid foods.   3) Please notify your doctor immediately if you have any unusual bleeding, trouble breathing, redness and pain at the surgery site, drainage, fever, or pain not relieved by medication.   4) Additional Instructions: TAKE A STOOL SOFTENER TWICE A DAY WHILE TAKING NARCOTIC PAIN MEDICINE TO PREVENT  CONSTIPATION  Please contact your physician with any problems or Same Day Surgery at [phone], Monday through Friday 6 am to 4 pm, or [place] [facility] Main number at [phone].  Document Released: 09/27/2005 Document Revised: 06/18/2015 Document Reviewed: 05/09/2013 Elsevier Interactive Patient Education [employer]2016 Elsevier Inc.

## 2016-04-08 NOTE — Brief Op Note (Signed)
04/08/2016  9:06 AM  PATIENT:  Gerald [name]  30 y.o. male  PRE-OPERATIVE DIAGNOSIS:  PORCELAIN GALLBLADDER  POST-OPERATIVE DIAGNOSIS:  PORCELAIN GALLBLADDER  PROCEDURE:  Procedure(s): LAPAROSCOPIC CHOLECYSTECTOMY WITH INTRAOPERATIVE CHOLANGIOGRAM (N/A)  SURGEON:  Surgeon(s) and Role:    * Ricarda Frameharles Melenie Minniear, MD - Primary  PHYSICIAN ASSISTANT:   ASSISTANTS: none   ANESTHESIA:   general  EBL:  Total I/O In: 1000 [I.V.:1000] Out: 20 [Blood:20]  BLOOD ADMINISTERED:none  DRAINS: none   LOCAL MEDICATIONS USED:  MARCAINE   , XYLOCAINE  and Amount: 30 ml  SPECIMEN:  Source of Specimen:  gallbladder and contents  DISPOSITION OF SPECIMEN:  PATHOLOGY  COUNTS:  YES  TOURNIQUET:  * No tourniquets in log *  DICTATION: .Dragon Dictation  PLAN OF CARE: Discharge to home after PACU  PATIENT DISPOSITION:  PACU - hemodynamically stable.   Delay start of Pharmacological VTE agent (>24hrs) due to surgical blood loss or risk of bleeding: not applicable

## 2016-04-08 NOTE — Transfer of Care (Signed)
Immediate Anesthesia Transfer of Care Note  Patient: Gerald [name]  Procedure(s) Performed: Procedure(s): LAPAROSCOPIC CHOLECYSTECTOMY WITH INTRAOPERATIVE CHOLANGIOGRAM (N/A)  Patient Location: PACU  Anesthesia Type:General  Level of Consciousness: awake, alert , oriented and patient cooperative  Airway & Oxygen Therapy: Patient Spontanous Breathing and Patient connected to face mask oxygen  Post-op Assessment: Report given to RN and Post -op Vital signs reviewed and stable  Post vital signs: Reviewed and stable  Last Vitals:  Filed Vitals:   04/08/16 0600  BP: 142/94  Pulse: 82  Temp: 36.6 C  Resp: 16    Last Pain:  Filed Vitals:   04/08/16 0602  PainSc: 2          Complications: No apparent anesthesia complications

## 2016-04-08 NOTE — Progress Notes (Signed)
IV Cipro to be started on call, Flagyl to be sent to OR with patient

## 2016-04-08 NOTE — Interval H&P Note (Signed)
History and Physical Interval Note:  04/08/2016 6:56 AM  Gerald NordmannNicholas [name]  has presented today for surgery, with the diagnosis of PORCELAIN GALLBLADDER  The various methods of treatment have been discussed with the patient and family. After consideration of risks, benefits and other options for treatment, the patient has consented to  Procedure(s): LAPAROSCOPIC CHOLECYSTECTOMY WITH INTRAOPERATIVE CHOLANGIOGRAM (N/A) as a surgical intervention .  The patient's history has been reviewed, patient examined, no change in status, stable for surgery.  I have reviewed the patient's chart and labs.  Questions were answered to the patient's satisfaction.     Ricarda Frameharles Donnald Tabar

## 2016-04-08 NOTE — Anesthesia Postprocedure Evaluation (Signed)
Anesthesia Post Note  Patient: Gerald NordmannNicholas [name]  Procedure(s) Performed: Procedure(s) (LRB): LAPAROSCOPIC CHOLECYSTECTOMY WITH INTRAOPERATIVE CHOLANGIOGRAM (N/A)  Patient location during evaluation: PACU Anesthesia Type: General Level of consciousness: awake and alert Pain management: pain level controlled Vital Signs Assessment: post-procedure vital signs reviewed and stable Respiratory status: spontaneous breathing, nonlabored ventilation, respiratory function stable and patient connected to nasal cannula oxygen Cardiovascular status: blood pressure returned to baseline and stable Postop Assessment: no signs of nausea or vomiting Anesthetic complications: no    Last Vitals:  Filed Vitals:   04/08/16 0940 04/08/16 0959  BP: 112/81 130/88  Pulse: 65 71  Temp: 36.2 C   Resp: 10 14    Last Pain:  Filed Vitals:   04/08/16 1002  PainSc: 2                  Cleda MccreedyJoseph K Piscitello

## 2016-04-08 NOTE — Anesthesia Procedure Notes (Signed)
Procedure Name: Intubation Date/Time: 04/08/2016 7:40 AM Performed by: Shirlee LimerickMARION, Leelyn Jasinski Pre-anesthesia Checklist: Patient identified, Emergency Drugs available, Suction available and Patient being monitored Patient Re-evaluated:Patient Re-evaluated prior to inductionOxygen Delivery Method: Circle system utilized Preoxygenation: Pre-oxygenation with 100% oxygen Intubation Type: IV induction Laryngoscope Size: Mac and 3 Grade View: Grade III Tube type: Oral Tube size: 7.0 mm Number of attempts: 2 Airway Equipment and Method: Bougie stylet Secured at: 23 cm Tube secured with: Tape Dental Injury: Teeth and Oropharynx as per pre-operative assessment and Bloody posterior oropharynx

## 2016-04-08 NOTE — Telephone Encounter (Signed)
Patient is calling again. No one has called him yet. He would like something called in for nausea. Also needs a work note that states when he had surgery and an estimated time of him being out of work. He needs to give that to his job.

## 2016-04-08 NOTE — H&P (View-Only) (Signed)
Patient ID: Gerald [name], male   DOB: 12/18/1985, 29 y.o.   MRN: [id number]  CC: Porcelain gallbladder  HPI Gerald [name] is a 29 y.o. male who presents to clinic for evaluation after being seen in the emergency department for atypical right lower chest pain. Patient reports that last week use in the emergency department after a night of pain to his right lower, lateral chest. He also had numerous bowel movements that night, had associated chills, nausea. He does have a significant history of anxiety. Since that time he denies any fevers, chills, shortness of breath, chest pain, abdominal pain, constipation, diarrhea. Prior to this tachycardia is never had anything like this. Upon workup in the emergency department cardiac and pulmonary causes were ruled out and images showed evidence of a porcelain gallbladder which she is here today to discuss removal of.  HPI  Past Medical History  Diagnosis Date  . Lyme disease   . Anxiety     Past Surgical History  Procedure Laterality Date  . Tonsillectomy and adenoidectomy  ~1991    Family History  Problem Relation Age of Onset  . Drug abuse Mother   . Hypertension Mother   . Diabetes Father     Social History Social History  Substance Use Topics  . Smoking status: Never Smoker   . Smokeless tobacco: Never Used  . Alcohol Use: No    Allergies  Allergen Reactions  . Amoxicillin Hives    Has patient had a PCN reaction causing immediate rash, facial/tongue/throat swelling, SOB or lightheadedness with hypotension: Yes Has patient had a PCN reaction causing severe rash involving mucus membranes or skin necrosis: Yes Has patient had a PCN reaction that required hospitalization No Has patient had a PCN reaction occurring within the last 10 years: No If all of the above answers are "NO", then may proceed with Cephalosporin use.     Current Outpatient Prescriptions  Medication Sig Dispense Refill  . aspirin-acetaminophen-caffeine (EXCEDRIN  MIGRAINE) 250-250-65 MG per tablet Take by mouth every 6 (six) hours as needed for headache. Reported on 10/14/2015    . busPIRone (BUSPAR) 7.5 MG tablet Take 1 tablet (7.5 mg total) by mouth 2 (two) times daily. 60 tablet 3  . escitalopram (LEXAPRO) 20 MG tablet TAKE 1 TABLET(20 MG) BY MOUTH DAILY 30 tablet 5  . ibuprofen (ADVIL,MOTRIN) 400 MG tablet Take 1 tablet (400 mg total) by mouth every 6 (six) hours as needed for moderate pain. 20 tablet 0  . Multiple Vitamin (MULTIVITAMIN) tablet Take 1 tablet by mouth daily.     No current facility-administered medications for this visit.     Review of Systems A Multi-point review of systems was asked and was negative except for the findings documented in the history of present illness  Physical Exam Blood pressure 137/82, pulse 81, temperature 96.5 F (35.8 C), temperature source Oral, height 5' 11" (1.803 m), weight 127.461 kg (281 lb). CONSTITUTIONAL: No acute distress. EYES: Pupils are equal, round, and reactive to light, Sclera are non-icteric. EARS, NOSE, MOUTH AND THROAT: The oropharynx is clear. The oral mucosa is pink and moist. Hearing is intact to voice. LYMPH NODES:  Lymph nodes in the neck are normal. RESPIRATORY:  Lungs are clear. There is normal respiratory effort, with equal breath sounds bilaterally, and without pathologic use of accessory muscles. CARDIOVASCULAR: Heart is regular without murmurs, gallops, or rubs. GI: The abdomen is soft, nontender, and nondistended. There are no palpable masses. There is no hepatosplenomegaly. There are normal bowel   sounds in all quadrants. GU: Rectal deferred.   MUSCULOSKELETAL: Normal muscle strength and tone. No cyanosis or edema.   SKIN: Turgor is good and there are no pathologic skin lesions or ulcers. NEUROLOGIC: Motor and sensation is grossly normal. Cranial nerves are grossly intact. PSYCH:  Oriented to person, place and time. Affect is normal.  Data Reviewed Images and labs reviewed.  Labs are primarily within normal limits except for a very mild elevation white blood cell count of 11,000, and a mild transaminitis of AST 207 and ALT of 191. All other labs are within normal limits. Ultrasound and CT of the abdomen and pelvis show a calcified gallbladder wall consistent with a porcelain gallbladder. I have personally reviewed the patient's imaging, laboratory findings and medical records.    Assessment    30 year old male with porcelain gallbladder    Plan    Discussed with the patient the atypical nature of his symptoms however also discussed the diagnosis of a porcelain gallbladder. The procedure of laparoscopic cholecystectomy was discussed in detail.I discussed the procedure in detail.  The patient was given Agricultural engineereducational material.  We discussed the risks and benefits of a laparoscopic cholecystectomy and possible cholangiogram including, but not limited to bleeding, infection, injury to surrounding structures such as the intestine or liver, bile leak, retained gallstones, need to convert to an open procedure, prolonged diarrhea, blood clots such as  DVT, common bile duct injury, anesthesia risks, and possible need for additional procedures.  The likelihood of improvement in symptoms and return to the patient's normal status is good. We discussed the typical post-operative recovery course. Patient voiced understanding and wishes to proceed. Plan for lap scopic cholecystectomy on June 28      Time spent with the patient was 55 minutes, with more than 50% of the time spent in face-to-face education, counseling and care coordination.     Ricarda Frameharles Romelo Sciandra, MD FACS General Surgeon 03/24/2016, 2:07 PM

## 2016-04-09 LAB — SURGICAL PATHOLOGY

## 2016-04-16 ENCOUNTER — Encounter: Payer: Self-pay | Admitting: General Surgery

## 2016-04-16 ENCOUNTER — Ambulatory Visit (INDEPENDENT_AMBULATORY_CARE_PROVIDER_SITE_OTHER): Payer: BLUE CROSS/BLUE SHIELD | Admitting: General Surgery

## 2016-04-16 VITALS — BP 147/86 | HR 83 | Temp 99.1°F | Ht 71.0 in | Wt 281.4 lb

## 2016-04-16 DIAGNOSIS — Z4889 Encounter for other specified surgical aftercare: Secondary | ICD-10-CM

## 2016-04-16 NOTE — Progress Notes (Signed)
Outpatient Surgical Follow Up  04/16/2016  Gerald [name] is an 30 y.o. male.   Chief Complaint  Patient presents with  . Routine Post Op    Laparoscopic Cholecystectomy (6/29)- Dr. Tonita CongWoodham    HPI: 30 year old male returns to clinic status post laparoscopic cholecystectomy. Patient reports no pain. He is eating well and having normal bowel function. He denies any fevers, chills, nausea, vomiting, chest pain, short of breath, diarrhea, constipation. He's been very happy with the surgical experience.  Past Medical History  Diagnosis Date  . Lyme disease   . Anxiety   . Headache     migraiones  . Complication of anesthesia   . Difficult intubation     Past Surgical History  Procedure Laterality Date  . Tonsillectomy and adenoidectomy  ~1991  . Cholecystectomy N/A 04/08/2016    Procedure: LAPAROSCOPIC CHOLECYSTECTOMY WITH INTRAOPERATIVE CHOLANGIOGRAM;  Surgeon: Gerald Frameharles Nikiyah Fackler, MD;  Location: [facility] ORS;  Service: General;  Laterality: N/A;    Family History  Problem Relation Age of Onset  . Drug abuse Mother   . Hypertension Mother   . Diabetes Father     Social History:  reports that he has never smoked. He has never used smokeless tobacco. He reports that he does not drink alcohol or use illicit drugs.  Allergies:  Allergies  Allergen Reactions  . Amoxicillin Hives    Has patient had a PCN reaction causing immediate rash, facial/tongue/throat swelling, SOB or lightheadedness with hypotension: Yes Has patient had a PCN reaction causing severe rash involving mucus membranes or skin necrosis: Yes Has patient had a PCN reaction that required hospitalization No Has patient had a PCN reaction occurring within the last 10 years: No If all of the above answers are "NO", then may proceed with Cephalosporin use.     Medications reviewed.    ROS A multipoint review of systems was performed. All pertinent positives and negatives are documented in the history of present illness  and remainder are negative.   BP 147/86 mmHg  Pulse 83  Temp(Src) 99.1 F (37.3 C) (Oral)  Ht 5\' 11"  (1.803 m)  Wt 127.642 kg (281 lb 6.4 oz)  BMI 39.26 kg/m2  Physical Exam  Gen.: No acute distress  chest, clear to all fixation Heart: Regular rhythm Abdomen: Soft, nontender, nondistended. Well approximated and healing lap scopic incision sites without evidence of erythema or drainage.   No results found for this or any previous visit (from the past 48 hour(s)). No results found.  Assessment/Plan:  1. Aftercare following surgery 30 year old male status post laparoscopic cholecystectomy. Pathology reviewed with the patient which showed no evidence of malignancy despite the previous diagnosis of porcelain gallbladder. Discussed standard return to normal activities and wound care precautions. Patient voiced understanding of all clinic on an as-needed basis.     Gerald Frameharles Kham Zuckerman, MD FACS General Surgeon  04/16/2016,4:15 PM

## 2016-04-16 NOTE — Patient Instructions (Signed)
Please call our office with any questions or concerns.  Shower normally washing with Soap and water. Please do not submerge in a tub, hot tub, or pool until incisions are completely sealed.  Use sun block to incision area over the next year if this area will be exposed to sun. This helps decrease scarring.  You may now resume your normal activities at home. Listen to your body when lifting, if you have pain when lifting, stop and then try again in a few days.  If you develop redness, drainage, or pain at incision sites- call our office immediately and speak with a nurse.  Please see your return to work note provided.

## 2016-04-26 ENCOUNTER — Encounter: Payer: BLUE CROSS/BLUE SHIELD | Admitting: General Surgery

## 2016-08-05 ENCOUNTER — Other Ambulatory Visit: Payer: Self-pay | Admitting: Primary Care

## 2016-08-05 DIAGNOSIS — F411 Generalized anxiety disorder: Secondary | ICD-10-CM

## 2016-08-06 ENCOUNTER — Ambulatory Visit (INDEPENDENT_AMBULATORY_CARE_PROVIDER_SITE_OTHER): Payer: BLUE CROSS/BLUE SHIELD | Admitting: Primary Care

## 2016-08-06 ENCOUNTER — Encounter: Payer: Self-pay | Admitting: Primary Care

## 2016-08-06 DIAGNOSIS — F411 Generalized anxiety disorder: Secondary | ICD-10-CM | POA: Diagnosis not present

## 2016-08-06 NOTE — Progress Notes (Signed)
Pre visit review using our clinic review tool, if applicable. No additional management support is needed unless otherwise documented below in the visit note. 

## 2016-08-06 NOTE — Patient Instructions (Addendum)
Continue Buspar 7.5 mg and Lexapro 20 mg. Switch to taking your medications in the morning rather than at night. Start this over the weekend.  I strongly encourage you to inquire more information regarding EAP.  Please keep me updated thorough my chart.  You are due for an annual physical, please schedule this within 3-6 months at your convenience.  It was a pleasure to see you today!

## 2016-08-06 NOTE — Assessment & Plan Note (Signed)
Overall improved, but has noticed feeling too relaxed. Will have him continue with Lexapro and Buspar for now. Switch to morning regimen to prevent insomnia. Strongly recommended he pursue counseling through EAP program at work. Will continue to monitor symptoms for now. May need to consider weaning off buspar.

## 2016-08-06 NOTE — Progress Notes (Signed)
Subjective:    Patient ID: Gerald [name], male    DOB: 26-Jun-1986, 30 y.o.   MRN: [id number]  HPI  Gerald [name] is a 30 year old male who presents today for follow up of Generalized Anxiety Disorder. Currently managed on Lexapro 20 mg and Buspar 7.5 mg BID for anxiety. He presented in 2017/05/23with complaints of constant worry, difficulty sleeping, and continued stress. He does work as a Civil Service fast streamer for a [employer] which has been very stressful. Buspar 7.5 mg BID was added last visit given continued symptoms.  Since his last visit he's noticed an improvement in anxiety and irritability. He's taking Buspar once daily. He has noticed almost too much reduction in anxiety which causes him to reflect more on his thoughts and be more relaxed when managing his direct reports. He has noticed difficulty getting out of bed some days and will sleep in if he doesn't have to be anywhere. Since 03-Mar-2023 he's noticed difficulty falling asleep and will wake during the night. He is taking his medications at night before bed.  He is looking into EAP for counseling through his occupation. Overall his anxiety has greatly improved.  Review of Systems  Constitutional: Negative for fatigue.  Respiratory: Negative for shortness of breath.   Cardiovascular: Negative for chest pain and palpitations.  Psychiatric/Behavioral: Positive for sleep disturbance. Negative for suicidal ideas. The patient is not nervous/anxious.        Past Medical History:  Diagnosis Date  . Anxiety   . Complication of anesthesia   . Difficult intubation   . Headache    migraiones  . Lyme disease      Social History   Social History  . Marital status: Married    Spouse name: N/A  . Number of children: N/A  . Years of education: N/A   Occupational History  . 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 Smoking status: Never Smoker  . Smokeless tobacco: Never Used  . Alcohol use No  . Drug use: No  . Sexual  activity: Not on file   Other Topics Concern  . Not on file   Social History Narrative   Agricultural consultant for [employer]   Married   2 kids, 3rd child died of hypoplastic L heart 02-Mar-2012    Past Surgical History:  Procedure Laterality Date  . CHOLECYSTECTOMY N/A 04/08/2016   Procedure: LAPAROSCOPIC CHOLECYSTECTOMY WITH INTRAOPERATIVE CHOLANGIOGRAM;  Surgeon: Ricarda Frame, MD;  Location: [facility] ORS;  Service: General;  Laterality: N/A;  . TONSILLECTOMY AND ADENOIDECTOMY  1990-03-02    Family History  Problem Relation Age of Onset  . Drug abuse Mother   . Hypertension Mother   . Diabetes Father     Allergies  Allergen Reactions  . Amoxicillin Hives    Has patient had a PCN reaction causing immediate rash, facial/tongue/throat swelling, SOB or lightheadedness with hypotension: Yes Has patient had a PCN reaction causing severe rash involving mucus membranes or skin necrosis: Yes Has patient had a PCN reaction that required hospitalization No Has patient had a PCN reaction occurring within the last 10 years: No If all of the above answers are "NO", then may proceed with Cephalosporin use.     Current Outpatient Prescriptions on File Prior to Visit  Medication Sig Dispense Refill  . aspirin-acetaminophen-caffeine (EXCEDRIN MIGRAINE) 250-250-65 MG per tablet Take by mouth every 6 (six) hours as needed for headache. Reported on 10/14/2015    . busPIRone (BUSPAR)  7.5 MG tablet Take 1 tablet (7.5 mg total) by mouth 2 (two) times daily. (Patient taking differently: Take 7.5 mg by mouth daily. ) 60 tablet 3  . Naproxen Sod-Diphenhydramine (ALEVE PM PO) Take 1 tablet by mouth at bedtime.    Marland Kitchen. escitalopram (LEXAPRO) 20 MG tablet TAKE 1 TABLET(20 MG) BY MOUTH DAILY 30 tablet 5   No current facility-administered medications on file prior to visit.     BP 124/78   Pulse 81   Temp 98.1 F (36.7 C) (Oral)   Ht 5\' 11"  (1.803 m)   Wt 288 lb 12.8 oz (131 kg)   SpO2 98%   BMI 40.28 kg/m     Objective:   Physical Exam  Constitutional: He appears well-nourished.  Cardiovascular: Normal rate and regular rhythm.   Pulmonary/Chest: Effort normal and breath sounds normal.  Skin: Skin is warm and dry.  Psychiatric: He has a normal mood and affect.          Assessment & Plan:

## 2016-08-25 ENCOUNTER — Encounter: Payer: Self-pay | Admitting: Family Medicine

## 2016-08-25 ENCOUNTER — Ambulatory Visit (INDEPENDENT_AMBULATORY_CARE_PROVIDER_SITE_OTHER): Payer: BLUE CROSS/BLUE SHIELD | Admitting: Family Medicine

## 2016-08-25 VITALS — BP 124/84 | HR 85 | Temp 98.3°F | Ht 71.0 in | Wt 290.8 lb

## 2016-08-25 DIAGNOSIS — N509 Disorder of male genital organs, unspecified: Secondary | ICD-10-CM | POA: Diagnosis not present

## 2016-08-25 DIAGNOSIS — N5089 Other specified disorders of the male genital organs: Secondary | ICD-10-CM

## 2016-08-25 NOTE — Patient Instructions (Signed)
Shirlee LimerickMarion will call about your referral. Keep using tinactin in the meantime.  Take care.  Glad to see you.

## 2016-08-25 NOTE — Progress Notes (Signed)
Pre visit review using our clinic review tool, if applicable. No additional management support is needed unless otherwise documented below in the visit note. 

## 2016-08-25 NOTE — Progress Notes (Signed)
Taking lexapro daily.  Taking buspar only as needed.  He is trying to regular his schedule to help with med adherence.   ~3 weeks ago had a nick on L side of scrotum, likely had presumed razor burn in the area.  He cleaned the area locally.  Then he didn't know if it was a superficial fungal infection after the fact.  Got tinactin in the meantime, started that in the last week.  Prev was itching but then that stopped with tinactin use.  The skin is healing up in the meantime.    Recently he felt a new bump on the L side of the scrotum but not on the testicle.  This was clearly a different issue from the skin changes.  Not ttp.  No R sided lesion.  No fevers chills nausea vomiting. No abdominal pain. No testicle pain. No discharge. No blood in urine. Normal urination.  Meds, vitals, and allergies reviewed.   ROS: Per HPI unless specifically indicated in ROS section   GEN: nad, alert and oriented CV: rrr.  no murmur PULM: ctab, no inc wob ABD: soft, +bs EXT: no edema SKIN: no acute rash but lipoma is noted on the right side of the back. Testes bilaterally descended without nodularity, tenderness or masses. No scrotal masses or lesions except for what feels like a small lipoma in the wall of the scrotum on the left side, this is not associated with testicle. No hernia.. No penis lesions or urethral discharge.

## 2016-08-26 ENCOUNTER — Encounter: Payer: Self-pay | Admitting: Family Medicine

## 2016-08-26 DIAGNOSIS — N5089 Other specified disorders of the male genital organs: Secondary | ICD-10-CM

## 2016-08-26 HISTORY — DX: Other specified disorders of the male genital organs: N50.89

## 2016-08-26 NOTE — Assessment & Plan Note (Addendum)
New issue. Needs workup. Check ultrasound. Orders placed. Differential diagnosis discussed patient. He does not have a testicle mass per se. He does have a lipoma on the back. It is possible that he could have a lipoma or some other benign skin cyst in or near the wall on the scrotum. It doesn't feel like hernia. All discussed with patient. Await imaging.  He doesn't have an acute rash but he does have minimal skin irritation that appears to be resolving superficial fungal infection on the left side of the scrotum. This appears to be completely separate issue. Okay for outpatient follow-up. >25 minutes spent in face to face time with patient, >50% spent in counselling or coordination of care.

## 2016-08-27 ENCOUNTER — Ambulatory Visit: Payer: BLUE CROSS/BLUE SHIELD

## 2016-08-27 ENCOUNTER — Telehealth: Payer: Self-pay | Admitting: Family Medicine

## 2016-08-27 NOTE — Telephone Encounter (Signed)
Patient called to tell you that the lump that you were sending him for the scrotal US is now gone He asked me to cancel the US that was scheduled for today. I called and cancelled the US, the patient wanted you to know.

## 2016-08-28 NOTE — Telephone Encounter (Signed)
I can't imagine this being significant pathology if it totally resolved.  If return of sx or other concerns than let us know. We can image later on if needed.  Routed to PCP as FYI. Thanks.

## 2016-08-28 NOTE — Telephone Encounter (Signed)
Noted and agree. 

## 2016-08-30 NOTE — Telephone Encounter (Signed)
Patient advised.

## 2016-09-28 ENCOUNTER — Telehealth: Payer: Self-pay | Admitting: Primary Care

## 2016-09-28 NOTE — Telephone Encounter (Signed)
[facility] - Client TELEPHONE ADVICE RECORD [facility]  Patient Name: Aris Kernes  DOB: 06/09/1986    Initial Comment caller states he thinks he was bitten on the leg by a spider   Nurse Assessment  Nurse: Laural BenesJohnson, RN, Dondra SpryGail Date/Time Lamount Cohen(Eastern Time): 09/28/2016 11:56:33 AM  Confirm and document reason for call. If symptomatic, describe symptoms. ---Felt something bit his right leg under his knee on front of calf; developed headache and vomiting the the next day Saturday Sunday felt a small lump under the skin and it was red today it feels like something underneath the bite mark.  Does the patient have any new or worsening symptoms? ---Yes  Will a triage be completed? ---Yes  Related visit to physician within the last 2 weeks? ---No  Does the PT have any chronic conditions? (i.e. diabetes, asthma, etc.) ---No  Is this a behavioral health or substance abuse call? ---No     Guidelines    Guideline Title Affirmed Question Affirmed Notes  Insect Bite [1] Red or very tender (to touch) area AND [2] started over 24 hours after the bite    Final Disposition User   See Physician within 24 Hours [place], [place], Dondra SpryGail    Comments  NOTE NO appts available until Thursday 09/30/2016 845am Allayne GitelmanK. Clark, NP insect bite possible infection   Referrals  REFERRED TO PCP OFFICE   Disagree/Comply: Comply

## 2016-09-28 NOTE — Telephone Encounter (Signed)
Pt has appt with Mayra ReelKate clark NP on 09/30/16 st 8:45 with Mayra ReelKate Clark NP.

## 2016-09-28 NOTE — Telephone Encounter (Signed)
Noted  

## 2016-09-30 ENCOUNTER — Encounter: Payer: Self-pay | Admitting: Primary Care

## 2016-09-30 ENCOUNTER — Ambulatory Visit (INDEPENDENT_AMBULATORY_CARE_PROVIDER_SITE_OTHER): Payer: BLUE CROSS/BLUE SHIELD | Admitting: Primary Care

## 2016-09-30 VITALS — BP 124/78 | HR 86 | Temp 98.1°F | Ht 71.0 in | Wt 293.4 lb

## 2016-09-30 DIAGNOSIS — L03115 Cellulitis of right lower limb: Secondary | ICD-10-CM | POA: Diagnosis not present

## 2016-09-30 MED ORDER — DOXYCYCLINE HYCLATE 100 MG PO TABS: 100.0000 mg | ORAL_TABLET | Freq: Two times a day (BID) | ORAL | 0 refills | Status: DC

## 2016-09-30 NOTE — Patient Instructions (Signed)
Start Doxycycline antibiotic. Take 1 tablet by mouth twice daily for 10 days.  Apply a warm compress to the site to allow for drainage. The wound may open up on its own.  Please call me if no improvement by Tuesday next week.  It was a pleasure to see you today! Merry Christmas!

## 2016-09-30 NOTE — Progress Notes (Signed)
Subjective:    Patient ID: Gerald NordmannNicholas [name], male    DOB: 06/02/1986, 30 y.o.   MRN: [id number]  HPI  [name] is a 30 year old male who presents today with a chief complaint of skin wound. His wound is located to the right lateral calf and he first noticed his wound on Saturday morning. He believes he was bitten by an insect. Yesterday he's noticed increased swelling, redness, tenderness. This morning he woke up and noticed puss, increased redness, and increased pain. He has not been outdoors in the woods, no known tick bite, no weakness. He experienced a migraine with vomiting once on Saturday which lasted just that day. He denies fevers.  Review of Systems  Constitutional: Positive for fatigue. Negative for fever.  Gastrointestinal: Negative for nausea.  Skin: Positive for color change and wound.  Neurological: Negative for headaches.       Past Medical History:  Diagnosis Date  . Anxiety   . Complication of anesthesia   . Difficult intubation   . Headache    migraiones  . Lyme disease      Social History   Social History  . Marital status: Married    Spouse name: N/A  . Number of children: N/A  . Years of education: N/A   Occupational History  . Manager [employer]   Social History Main Topics  . Smoking status: Never Smoker  . Smokeless tobacco: Never Used  . Alcohol use No  . Drug use: No  . Sexual activity: Not on file   Other Topics Concern  . 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 CHOLECYSTECTOMY N/A 04/08/2016   Procedure: LAPAROSCOPIC CHOLECYSTECTOMY WITH INTRAOPERATIVE CHOLANGIOGRAM;  Surgeon: Ricarda Frameharles Woodham, MD;  Location: [facility] ORS;  Service: General;  Laterality: N/A;  . TONSILLECTOMY AND ADENOIDECTOMY  ~1991    Family History  Problem Relation Age of Onset  . Drug abuse Mother   .  Hypertension Mother   . Diabetes Father     Allergies  Allergen Reactions  . Amoxicillin Hives    Has patient had a PCN reaction causing immediate rash, facial/tongue/throat swelling, SOB or lightheadedness with hypotension: Yes Has patient had a PCN reaction causing severe rash involving mucus membranes or skin necrosis: Yes Has patient had a PCN reaction that required hospitalization No Has patient had a PCN reaction occurring within the last 10 years: No If all of the above answers are "NO", then may proceed with Cephalosporin use.     Current Outpatient Prescriptions on File Prior to Visit  Medication Sig Dispense Refill  . aspirin-acetaminophen-caffeine (EXCEDRIN MIGRAINE) 250-250-65 MG per tablet Take by mouth every 6 (six) hours as needed for headache. Reported on 10/14/2015    . escitalopram (LEXAPRO) 20 MG tablet TAKE 1 TABLET(20 MG) BY MOUTH DAILY 30 tablet 5  . Naproxen Sod-Diphenhydramine (ALEVE PM PO) Take 1 tablet by mouth at bedtime.    . busPIRone (BUSPAR) 7.5 MG tablet Take 7.5 mg by mouth daily as needed.     No current facility-administered medications on file prior to visit.     BP 124/78   Pulse 86   Temp 98.1 F (36.7 C) (Oral)   Ht 5\' 11"  (1.803 m)   Wt 293 lb 6.4 oz (133.1 kg)   SpO2 97%  BMI 40.92 kg/m    Objective:   Physical Exam  Constitutional: He appears well-nourished.  Cardiovascular: Normal rate and regular rhythm.   Pulmonary/Chest: Effort normal and breath sounds normal.  Skin: There is erythema.  2-3 cm circular area of moderate erythema and tenderness. Firm, and deep feeling mass under surface.          Assessment & Plan:  Cellulitis:  Located to right lateral calf x 5 days. Increased redness, swelling, tenderness since. Exam today with evidence of cellulitis to site of concern. Suspect from insect/spider bite due to appearance and given lack of other symptoms. Rx for Doxycycline course sent to pharmacy. Discussed to allow  wound to drain. Warm compresses. Ibuprofen for pain and inflammation. He will notify Tuesday if no improvement.  Gerald [name],Gerald Kendal, NP

## 2016-09-30 NOTE — Progress Notes (Signed)
Pre visit review using our clinic review tool, if applicable. No additional management support is needed unless otherwise documented below in the visit note. 

## 2016-11-25 ENCOUNTER — Ambulatory Visit: Payer: BLUE CROSS/BLUE SHIELD | Admitting: Adult Health

## 2016-11-26 ENCOUNTER — Ambulatory Visit (INDEPENDENT_AMBULATORY_CARE_PROVIDER_SITE_OTHER): Payer: BLUE CROSS/BLUE SHIELD | Admitting: Primary Care

## 2016-11-26 ENCOUNTER — Encounter: Payer: Self-pay | Admitting: Primary Care

## 2016-11-26 VITALS — BP 124/84 | HR 87 | Temp 98.4°F | Ht 71.0 in | Wt 287.8 lb

## 2016-11-26 DIAGNOSIS — J309 Allergic rhinitis, unspecified: Secondary | ICD-10-CM

## 2016-11-26 NOTE — Progress Notes (Signed)
Subjective:    Patient ID: Gerald [name], male    DOB: 03/22/1986, 31 y.o.   MRN: [id number]  HPI  Gerald [name] is a 31 year old male who presents today with a chief complaint of nasal congestion. He also reports sinus pressure, cough, mild body aches. He denies fevers, exposure to influenza. His symptoms began yesterday. No one else in his home has these symptoms. He did not get his flu shot last season. He's taken tylenol without much improvement. He's blowing some mucous from his nose without improvement in pressure.   Review of Systems  Constitutional: Negative for chills, fatigue and fever.  HENT: Positive for congestion, sinus pressure and sore throat. Negative for ear pain.   Respiratory: Positive for cough. Negative for shortness of breath.   Cardiovascular: Negative for chest pain.       Past Medical History:  Diagnosis Date  . Anxiety   . Complication of anesthesia   . Difficult intubation   . Headache    migraiones  . Lyme disease      Social History   Social History  . Marital status: Married    Spouse name: N/A  . Number of children: N/A  . Years of education: N/A   Occupational History  . Manager [employer]   Social History Main Topics  . Smoking status: Never Smoker  . Smokeless tobacco: Never Used  . Alcohol use No  . Drug use: No  . Sexual activity: Not on file   Other Topics Concern  . 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 CHOLECYSTECTOMY N/A 04/08/2016   Procedure: LAPAROSCOPIC CHOLECYSTECTOMY WITH INTRAOPERATIVE CHOLANGIOGRAM;  Surgeon: Ricarda Frameharles Woodham, MD;  Location: [facility] ORS;  Service: General;  Laterality: N/A;  . TONSILLECTOMY AND ADENOIDECTOMY  ~1991    Family History  Problem Relation Age of Onset  . Drug abuse Mother   . Hypertension Mother   . Diabetes Father     Allergies    Allergen Reactions  . Amoxicillin Hives    Has patient had a PCN reaction causing immediate rash, facial/tongue/throat swelling, SOB or lightheadedness with hypotension: Yes Has patient had a PCN reaction causing severe rash involving mucus membranes or skin necrosis: Yes Has patient had a PCN reaction that required hospitalization No Has patient had a PCN reaction occurring within the last 10 years: No If all of the above answers are "NO", then may proceed with Cephalosporin use.     Current Outpatient Prescriptions on File Prior to Visit  Medication Sig Dispense Refill  . aspirin-acetaminophen-caffeine (EXCEDRIN MIGRAINE) 250-250-65 MG per tablet Take by mouth every 6 (six) hours as needed for headache. Reported on 10/14/2015    . escitalopram (LEXAPRO) 20 MG tablet TAKE 1 TABLET(20 MG) BY MOUTH DAILY 30 tablet 5  . Naproxen Sod-Diphenhydramine (ALEVE PM PO) Take 1 tablet by mouth at bedtime.    . busPIRone (BUSPAR) 7.5 MG tablet Take 7.5 mg by mouth daily as needed.     No current facility-administered medications on file prior to visit.     BP 124/84   Pulse 87   Temp 98.4 F (36.9 C) (Oral)   Ht 5\' 11"  (1.803 m)   Wt 287 lb 12.8 oz (130.5 kg)   SpO2 97%   BMI 40.14 kg/m  Objective:   Physical Exam  Constitutional: He appears well-nourished.  HENT:  Right Ear: Tympanic membrane and ear canal normal.  Left Ear: Tympanic membrane and ear canal normal.  Nose: Mucosal edema present. Right sinus exhibits maxillary sinus tenderness. Right sinus exhibits no frontal sinus tenderness. Left sinus exhibits maxillary sinus tenderness. Left sinus exhibits no frontal sinus tenderness.  Mouth/Throat: Oropharynx is clear and moist.  Eyes: Conjunctivae are normal.  Neck: Neck supple.  Cardiovascular: Normal rate and regular rhythm.   Pulmonary/Chest: Effort normal and breath sounds normal. He has no wheezes. He has no rales.  Skin: Skin is warm and dry.          Assessment &  Plan:  Allergic Rhinitis:  Sinus pressure, cough, fatigue x 24 hours. No improvement with tylenol. Exam today with nasal mucosal edema, sinus tenderness. Suspect allergy involvement given fluctuations in weather. Discussed use of Flonase, Zyrtec, Ibuprofen. Fluids, rest, follow up PRN.  Morrie Sheldon, NP

## 2016-11-26 NOTE — Progress Notes (Signed)
Pre visit review using our clinic review tool, if applicable. No additional management support is needed unless otherwise documented below in the visit note. 

## 2016-11-26 NOTE — Patient Instructions (Signed)
Your symptoms are related to allergies.  Nasal Congestion/Sinus Pressure: Try using Flonase (fluticasone) nasal spray. Instill 1 spray in each nostril twice daily. Also start an antihistamine like Zyrtec once daily for the next several weeks.  You may take ibuprofen 600 mg three times daily as needed for pain and inflammation.  Please notify me if you start blowing thick, green mucous from your nasal cavity after 1 week of symptoms, you run fevers over 101, you start coughing up thick green mucous after 1 week of symptoms.  It was a pleasure to see you today!

## 2017-07-15 ENCOUNTER — Ambulatory Visit (INDEPENDENT_AMBULATORY_CARE_PROVIDER_SITE_OTHER): Payer: BLUE CROSS/BLUE SHIELD | Admitting: Primary Care

## 2017-07-15 ENCOUNTER — Encounter: Payer: Self-pay | Admitting: Primary Care

## 2017-07-15 VITALS — BP 134/64 | HR 85 | Temp 98.0°F | Wt 300.0 lb

## 2017-07-15 DIAGNOSIS — K219 Gastro-esophageal reflux disease without esophagitis: Secondary | ICD-10-CM | POA: Insufficient documentation

## 2017-07-15 DIAGNOSIS — R7989 Other specified abnormal findings of blood chemistry: Secondary | ICD-10-CM | POA: Diagnosis not present

## 2017-07-15 DIAGNOSIS — Z0000 Encounter for general adult medical examination without abnormal findings: Secondary | ICD-10-CM

## 2017-07-15 LAB — LIPID PANEL
CHOLESTEROL: 201 mg/dL — AB (ref 0–200)
HDL: 28.8 mg/dL — AB (ref >39.00)
Total CHOL/HDL Ratio: 7

## 2017-07-15 LAB — COMPREHENSIVE METABOLIC PANEL
ALBUMIN: 4.1 g/dL (ref 3.5–5.2)
ALT: 44 U/L (ref 0–53)
AST: 26 U/L (ref 0–37)
Alkaline Phosphatase: 87 U/L (ref 39–117)
BUN: 10 mg/dL (ref 6–23)
CALCIUM: 9.1 mg/dL (ref 8.4–10.5)
CO2: 28 meq/L (ref 19–32)
Chloride: 102 mEq/L (ref 96–112)
Creatinine, Ser: 0.94 mg/dL (ref 0.40–1.50)
GFR: 98.34 mL/min (ref >60.00)
Glucose, Bld: 80 mg/dL (ref 70–99)
POTASSIUM: 4 meq/L (ref 3.5–5.1)
Sodium: 137 mEq/L (ref 135–145)
Total Bilirubin: 0.3 mg/dL (ref 0.2–1.2)
Total Protein: 6.8 g/dL (ref 6.0–8.3)

## 2017-07-15 LAB — HEMOGLOBIN A1C: HEMOGLOBIN A1C: 5.6 % (ref 4.6–6.5)

## 2017-07-15 LAB — LDL CHOLESTEROL, DIRECT: LDL DIRECT: 118 mg/dL

## 2017-07-15 MED ORDER — OMEPRAZOLE 40 MG PO CPDR: 40.0000 mg | DELAYED_RELEASE_CAPSULE | Freq: Every day | ORAL | 0 refills | Status: DC

## 2017-07-15 NOTE — Assessment & Plan Note (Signed)
Symptoms increased since gall bladder removal in June 2017, could be contributing. Also suspect weight gain contributing to symptoms. Rx for omeprazole 40 mg sent to pharmacy given that Zantac is not helpful. Will do this for 30 days then reduce back down to Zantac 150 BID. He will update in 1-2 weeks.

## 2017-07-15 NOTE — Patient Instructions (Signed)
Stop taking ranitidine tablets.  Start taking omeprazole 40 mg capsules for heartburn for 30 days.  Resume ranitidine by taking 150 mg tablets twice daily. Please update me if your symptoms return after coming off of the omeprazole.   It was a pleasure to see you today!   Food Choices for Gastroesophageal Reflux Disease, Adult When you have gastroesophageal reflux disease (GERD), the foods you eat and your eating habits are very important. Choosing the right foods can help ease the discomfort of GERD. Consider working with a diet and nutrition specialist (dietitian) to help you make healthy food choices. What general guidelines should I follow? Eating plan  Choose healthy foods low in fat, such as fruits, vegetables, whole grains, low-fat dairy products, and lean meat, fish, and poultry.  Eat frequent, small meals instead of three large meals each day. Eat your meals slowly, in a relaxed setting. Avoid bending over or lying down until 2-3 hours after eating.  Limit high-fat foods such as fatty meats or fried foods.  Limit your intake of oils, butter, and shortening to less than 8 teaspoons each day.  Avoid the following: ? Foods that cause symptoms. These may be different for different people. Keep a food diary to keep track of foods that cause symptoms. ? Alcohol. ? Drinking large amounts of liquid with meals. ? Eating meals during the 2-3 hours before bed.  Cook foods using methods other than frying. This may include baking, grilling, or broiling. Lifestyle   Maintain a healthy weight. Ask your health care provider what weight is healthy for you. If you need to lose weight, work with your health care provider to do so safely.  Exercise for at least 30 minutes on 5 or more days each week, or as told by your health care provider.  Avoid wearing clothes that fit tightly around your waist and chest.  Do not use any products that contain nicotine or tobacco, such as cigarettes and  e-cigarettes. If you need help quitting, ask your health care provider.  Sleep with the head of your bed raised. Use a wedge under the mattress or blocks under the bed frame to raise the head of the bed. What foods are not recommended? The items listed may not be a complete list. Talk with your dietitian about what dietary choices are best for you. Grains Pastries or quick breads with added fat. [place] toast. Vegetables Deep fried vegetables. [place] fries. Any vegetables prepared with added fat. Any vegetables that cause symptoms. For some people this may include tomatoes and tomato products, chili peppers, onions and garlic, and horseradish. Fruits Any fruits prepared with added fat. Any fruits that cause symptoms. For some people this may include citrus fruits, such as oranges, grapefruit, pineapple, and lemons. Meats and other protein foods High-fat meats, such as fatty beef or pork, hot dogs, ribs, ham, sausage, salami and bacon. Fried meat or protein, including fried fish and fried chicken. Nuts and nut butters. Dairy Whole milk and chocolate milk. Sour cream. Cream. Ice cream. Cream cheese. Milk shakes. Beverages Coffee and tea, with or without caffeine. Carbonated beverages. Sodas. Energy drinks. Fruit juice made with acidic fruits (such as orange or grapefruit). Tomato juice. Alcoholic drinks. Fats and oils Butter. Margarine. Shortening. Ghee. Sweets and desserts Chocolate and cocoa. Donuts. Seasoning and other foods Pepper. Peppermint and spearmint. Any condiments, herbs, or seasonings that cause symptoms. For some people, this may include curry, hot sauce, or vinegar-based salad dressings. Summary  When you have gastroesophageal  reflux disease (GERD), food and lifestyle choices are very important to help ease the discomfort of GERD.  Eat frequent, small meals instead of three large meals each day. Eat your meals slowly, in a relaxed setting. Avoid bending over or lying down until  2-3 hours after eating.  Limit high-fat foods such as fatty meat or fried foods. This information is not intended to replace advice given to you by your health care provider. Make sure you discuss any questions you have with your health care provider. Document Released: 09/27/2005 Document Revised: 09/28/2016 Document Reviewed: 09/28/2016 Elsevier Interactive Patient Education  2017 [employer].

## 2017-07-15 NOTE — Progress Notes (Signed)
Subjective:    Patient ID: Gerald [name], male    DOB: 12/18/1985, 31 y.o.   MRN: [id number]  HPI  Gerald [name] is a 31 year old male who presents today with a chief complaint of esophageal reflux. He's experiencing symptoms of esophageal burning and throat burning. Symptoms occur in the evening mostly, most every night with occasional symptoms during the day if he eats something spicy. He woke up at 4 am on Tuesday this week with symptoms of esophageal burning.  He's been limiting spicy foods. He's been taking ranitidine 150 mg five days weekly just before dinner or before bed but this doesn't seem to be helping much. His symptoms have been present for years with symptoms occurring once monthly on average. Symptoms have gradually increased since his gall bladder was removed in June 2017. He denies abdominal pain, nausea, vomiting, constipation/diarrhea.   Wt Readings from Last 3 Encounters:  07/15/17 300 lb (136.1 kg)  11/26/16 287 lb 12.8 oz (130.5 kg)  09/30/16 293 lb 6.4 oz (133.1 kg)     Review of Systems  Respiratory: Negative for cough.   Gastrointestinal: Negative for constipation, diarrhea and nausea.       GERD       Past Medical History:  Diagnosis Date  . Anxiety   . Complication of anesthesia   . Difficult intubation   . Headache    migraiones  . Lyme disease      Social History   Social History  . Marital status: Married    Spouse name: N/A  . Number of children: N/A  . Years of education: N/A   Occupational History  . 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 Smoking status: Never Smoker  . Smokeless tobacco: Never Used  . Alcohol use No  . Drug use: No  . Sexual activity: Not on file   Other Topics Concern  . Not on file   Social History Narrative   Agricultural consultant for [employer]   Married   2 kids, 3rd child died of hypoplastic L heart 02/20/12    Past Surgical History:  Procedure Laterality Date  . CHOLECYSTECTOMY N/A 04/08/2016   Procedure: LAPAROSCOPIC CHOLECYSTECTOMY WITH INTRAOPERATIVE CHOLANGIOGRAM;  Surgeon: Ricarda Frame, MD;  Location: [facility] ORS;  Service: General;  Laterality: N/A;  . TONSILLECTOMY AND ADENOIDECTOMY  1990/02/19    Family History  Problem Relation Age of Onset  . Drug abuse Mother   . Hypertension Mother   . Diabetes Father     Allergies  Allergen Reactions  . Amoxicillin Hives    Has patient had a PCN reaction causing immediate rash, facial/tongue/throat swelling, SOB or lightheadedness with hypotension: Yes Has patient had a PCN reaction causing severe rash involving mucus membranes or skin necrosis: Yes Has patient had a PCN reaction that required hospitalization No Has patient had a PCN reaction occurring within the last 10 years: No If all of the above answers are "NO", then may proceed with Cephalosporin use.     Current Outpatient Prescriptions on File Prior to Visit  Medication Sig Dispense Refill  . aspirin-acetaminophen-caffeine (EXCEDRIN MIGRAINE) 250-250-65 MG per tablet Take by mouth every 6 (six) hours as needed for headache. Reported on 10/14/2015    . Naproxen Sod-Diphenhydramine (ALEVE PM PO) Take 1 tablet by mouth at bedtime.     No current facility-administered medications on file prior to visit.     BP 134/64   Pulse 85   Temp 98 F (  36.7 C) (Oral)   Wt 300 lb (136.1 kg)   SpO2 98%   BMI 41.84 kg/m    Objective:   Physical Exam  Constitutional: He appears well-nourished.  Neck: Neck supple.  Cardiovascular: Normal rate and regular rhythm.   Pulmonary/Chest: Effort normal and breath sounds normal.  Abdominal: Soft. Bowel sounds are normal. There is no tenderness.  Skin: Skin is warm and dry.          Assessment & Plan:

## 2017-07-19 ENCOUNTER — Ambulatory Visit (INDEPENDENT_AMBULATORY_CARE_PROVIDER_SITE_OTHER): Payer: BLUE CROSS/BLUE SHIELD | Admitting: Primary Care

## 2017-07-19 ENCOUNTER — Encounter: Payer: Self-pay | Admitting: Primary Care

## 2017-07-19 VITALS — BP 124/84 | HR 104 | Temp 98.2°F | Ht 71.0 in | Wt 296.1 lb

## 2017-07-19 DIAGNOSIS — F411 Generalized anxiety disorder: Secondary | ICD-10-CM | POA: Diagnosis not present

## 2017-07-19 DIAGNOSIS — Z6841 Body Mass Index (BMI) 40.0 and over, adult: Secondary | ICD-10-CM

## 2017-07-19 DIAGNOSIS — E785 Hyperlipidemia, unspecified: Secondary | ICD-10-CM

## 2017-07-19 DIAGNOSIS — K219 Gastro-esophageal reflux disease without esophagitis: Secondary | ICD-10-CM

## 2017-07-19 DIAGNOSIS — Z0000 Encounter for general adult medical examination without abnormal findings: Secondary | ICD-10-CM | POA: Diagnosis not present

## 2017-07-19 DIAGNOSIS — E782 Mixed hyperlipidemia: Secondary | ICD-10-CM | POA: Insufficient documentation

## 2017-07-19 NOTE — Assessment & Plan Note (Signed)
Improved since starting omeprazole 40 mg, continue same. Discussed the importance of a healthy diet and regular exercise in order for weight loss and reduction of GERD symptoms.

## 2017-07-19 NOTE — Assessment & Plan Note (Signed)
Td UTD, influenza vaccination provided today. Discussed the importance of a healthy diet and regular exercise in order for weight loss, and to reduce the risk of other medical problems. Exam unremarkable. Labs with hypertriglyceridemia which was addressed. Follow up in 1 year for annual exam.

## 2017-07-19 NOTE — Assessment & Plan Note (Signed)
Discussed the importance of a healthy diet and regular exercise in order for weight loss, and to reduce the risk of other medical problems.  

## 2017-07-19 NOTE — Patient Instructions (Signed)
It's important to improve your diet by reducing consumption of fast food, fried food, processed snack foods, sugary drinks. Increase consumption of fresh vegetables and fruits, whole grains, water.  Ensure you are drinking 64 ounces of water daily.  Start exercising. You should be getting 150 minutes of moderate intensity exercise weekly.  Please notify me when are you nearing completion of the omeprazole 40 mg capsules.  Schedule a lab only appointment in 6 weeks to recheck cholesterol.  It was a pleasure to see you today!  Food Choices to Lower Your Triglycerides Triglycerides are a type of fat in your blood. High levels of triglycerides can increase the risk of heart disease and stroke. If your triglyceride levels are high, the foods you eat and your eating habits are very important. Choosing the right foods can help lower your triglycerides. What general guidelines do I need to follow?  Lose weight if you are overweight.  Limit or avoid alcohol.  Fill one half of your plate with vegetables and green salads.  Limit fruit to two servings a day. Choose fruit instead of juice.  Make one fourth of your plate whole grains. Look for the word "whole" as the first word in the ingredient list.  Fill one fourth of your plate with lean protein foods.  Enjoy fatty fish (such as salmon, mackerel, sardines, and tuna) three times a week.  Choose healthy fats.  Limit foods high in starch and sugar.  Eat more home-cooked food and less restaurant, buffet, and fast food.  Limit fried foods.  Cook foods using methods other than frying.  Limit saturated fats.  Check ingredient lists to avoid foods with partially hydrogenated oils (trans fats) in them. What foods can I eat? Grains Whole grains, such as whole wheat or whole grain breads, crackers, cereals, and pasta. Unsweetened oatmeal, bulgur, barley, quinoa, or brown rice. Corn or whole wheat flour tortillas. Vegetables Fresh or frozen  vegetables (raw, steamed, roasted, or grilled). Green salads. Fruits All fresh, canned (in natural juice), or frozen fruits. Meat and Other Protein Products Ground beef (85% or leaner), grass-fed beef, or beef trimmed of fat. Skinless chicken or [place]. Ground chicken or [place]. Pork trimmed of fat. All fish and seafood. Eggs. Dried beans, peas, or lentils. Unsalted nuts or seeds. Unsalted canned or dry beans. Dairy Low-fat dairy products, such as skim or 1% milk, 2% or reduced-fat cheeses, low-fat ricotta or cottage cheese, or plain low-fat yogurt. Fats and Oils Tub margarines without trans fats. Light or reduced-fat mayonnaise and salad dressings. Avocado. Safflower, olive, or canola oils. Natural peanut or almond butter. The items listed above may not be a complete list of recommended foods or beverages. Contact your dietitian for more options. What foods are not recommended? Grains White bread. White pasta. White rice. Cornbread. Bagels, pastries, and croissants. Crackers that contain trans fat. Vegetables White potatoes. Corn. Creamed or fried vegetables. Vegetables in a cheese sauce. Fruits Dried fruits. Canned fruit in light or heavy syrup. Fruit juice. Meat and Other Protein Products Fatty cuts of meat. Ribs, chicken wings, bacon, sausage, bologna, salami, chitterlings, fatback, hot dogs, bratwurst, and packaged luncheon meats. Dairy Whole or 2% milk, cream, half-and-half, and cream cheese. Whole-fat or sweetened yogurt. Full-fat cheeses. Nondairy creamers and whipped toppings. Processed cheese, cheese spreads, or cheese curds. Sweets and Desserts Corn syrup, sugars, honey, and molasses. Candy. Jam and jelly. Syrup. Sweetened cereals. Cookies, pies, cakes, donuts, muffins, and ice cream. Fats and Oils Butter, stick margarine, lard, shortening, ghee,  or bacon fat. Coconut, palm kernel, or palm oils. Beverages Alcohol. Sweetened drinks (such as sodas, lemonade, and fruit drinks or  punches). The items listed above may not be a complete list of foods and beverages to avoid. Contact your dietitian for more information. This information is not intended to replace advice given to you by your health care provider. Make sure you discuss any questions you have with your health care provider. Document Released: 07/15/2004 Document Revised: 03/04/2016 Document Reviewed: 08/01/2013 Elsevier Interactive Patient Education  2017 [employer].

## 2017-07-19 NOTE — Assessment & Plan Note (Signed)
Doing well of of Lexapro, continue to monitor.

## 2017-07-19 NOTE — Progress Notes (Signed)
Subjective:    Patient ID: Gerald [name], male    DOB: Jan 06, 1986, 31 y.o.   MRN: [id number]  HPI  Gerald [name] is a 31 year old male who presents today for complete physical.  Immunizations: -Tetanus: Completed in 2012-02-20 -Influenza: Due   Diet: He endorses a poor diet.  Breakfast: Fast food Lunch: Fast food Dinner: Meat, vegetable, starch, pasta  Snacks: Chips, crackers Desserts: Occasionally  Beverages: Diet soda, 1-2 bottles of water, occasional sweet tea  Exercise: He is not currently exercises.  Eye exam: Completes annually Dental exam: Completes annually   Review of Systems  Constitutional: Negative for unexpected weight change.  HENT: Negative for rhinorrhea.   Respiratory: Negative for cough and shortness of breath.   Cardiovascular: Negative for chest pain.  Gastrointestinal: Negative for constipation and diarrhea.  Genitourinary: Negative for difficulty urinating.  Musculoskeletal: Negative for arthralgias and myalgias.  Skin: Negative for rash.  Allergic/Immunologic: Negative for environmental allergies.  Neurological: Negative for dizziness, numbness and headaches.  Psychiatric/Behavioral:       Doing well off of Lexapro       Past Medical History:  Diagnosis Date  . Anxiety   . Complication of anesthesia   . Difficult intubation   . Headache    migraiones  . Lyme disease      Social History   Social History  . Marital status: Married    Spouse name: N/A  . Number of children: N/A  . Years of education: N/A   Occupational History  . 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 Smoking status: Never Smoker  . Smokeless tobacco: Never Used  . Alcohol use No  . Drug use: No  . Sexual activity: Not on file   Other Topics Concern  . Not on file   Social History Narrative   Agricultural consultant for [employer]   Married   2 kids, 3rd child died of hypoplastic L heart 2012-02-20    Past Surgical History:  Procedure Laterality Date  .  CHOLECYSTECTOMY N/A 04/08/2016   Procedure: LAPAROSCOPIC CHOLECYSTECTOMY WITH INTRAOPERATIVE CHOLANGIOGRAM;  Surgeon: Ricarda Frame, MD;  Location: [facility] ORS;  Service: General;  Laterality: N/A;  . TONSILLECTOMY AND ADENOIDECTOMY  Feb 19, 1990    Family History  Problem Relation Age of Onset  . Drug abuse Mother   . Hypertension Mother   . Diabetes Father     Allergies  Allergen Reactions  . Amoxicillin Hives    Has patient had a PCN reaction causing immediate rash, facial/tongue/throat swelling, SOB or lightheadedness with hypotension: Yes Has patient had a PCN reaction causing severe rash involving mucus membranes or skin necrosis: Yes Has patient had a PCN reaction that required hospitalization No Has patient had a PCN reaction occurring within the last 10 years: No If all of the above answers are "NO", then may proceed with Cephalosporin use.     Current Outpatient Prescriptions on File Prior to Visit  Medication Sig Dispense Refill  . omeprazole (PRILOSEC) 40 MG capsule Take 1 capsule (40 mg total) by mouth daily. 30 capsule 0  . ranitidine (ZANTAC) 150 MG tablet Take 150 mg by mouth 2 (two) times daily.    Marland Kitchen aspirin-acetaminophen-caffeine (EXCEDRIN MIGRAINE) 250-250-65 MG per tablet Take by mouth every 6 (six) hours as needed for headache. Reported on 10/14/2015    . Naproxen Sod-Diphenhydramine (ALEVE PM PO) Take 1 tablet by mouth at bedtime.     No current facility-administered medications on file prior to  visit.     BP 124/84   Pulse (!) 104   Temp 98.2 F (36.8 C) (Oral)   Ht  (1.803 m)   Wt 296 lb 1.9 oz (134.3 kg)   SpO2 97%   BMI 41.30 kg/m    Objective:   Physical Exam  Constitutional: He is oriented to person, place, and time. He appears well-nourished.  HENT:  Right Ear: Tympanic membrane and ear canal normal.  Left Ear: Tympanic membrane and ear canal normal.  Nose: Nose normal. Right sinus exhibits no maxillary sinus tenderness and no frontal sinus  tenderness. Left sinus exhibits no maxillary sinus tenderness and no frontal sinus tenderness.  Mouth/Throat: Oropharynx is clear and moist.  Eyes: Pupils are equal, round, and reactive to light. Conjunctivae and EOM are normal.  Neck: Neck supple. Carotid bruit is not present. No thyromegaly present.  Cardiovascular: Normal rate, regular rhythm and normal heart sounds.   Pulmonary/Chest: Effort normal and breath sounds normal. He has no wheezes. He has no rales.  Abdominal: Soft. Bowel sounds are normal. There is no tenderness.  Musculoskeletal: Normal range of motion.  Neurological: He is alert and oriented to person, place, and time. He has normal reflexes. No cranial nerve deficit.  Skin: Skin is warm and dry.  Psychiatric: He has a normal mood and affect.          Assessment & Plan:

## 2017-07-19 NOTE — Assessment & Plan Note (Signed)
TC of 201, Trigs of 510, he was fasting during labs. Will allow him to work on weight loss through diet and exercise to get triglycerides decreased. Long discussion about diet and exercise today.  Start Fish Oil 1000 mg BID with meals. Repeat labs in 6 weeks.

## 2018-02-27 ENCOUNTER — Ambulatory Visit: Payer: BLUE CROSS/BLUE SHIELD | Admitting: Primary Care

## 2018-02-27 ENCOUNTER — Encounter: Payer: Self-pay | Admitting: Primary Care

## 2018-02-27 VITALS — BP 122/82 | HR 89 | Temp 97.8°F | Ht 71.0 in | Wt 299.0 lb

## 2018-02-27 DIAGNOSIS — R519 Headache, unspecified: Secondary | ICD-10-CM | POA: Insufficient documentation

## 2018-02-27 DIAGNOSIS — R51 Headache: Secondary | ICD-10-CM

## 2018-02-27 DIAGNOSIS — K2100 Gastro-esophageal reflux disease with esophagitis, without bleeding: Secondary | ICD-10-CM

## 2018-02-27 MED ORDER — OMEPRAZOLE 20 MG PO CPDR: 20.0000 mg | DELAYED_RELEASE_CAPSULE | Freq: Every day | ORAL | 0 refills | Status: DC

## 2018-02-27 MED ORDER — NAPROXEN 500 MG PO TABS: ORAL_TABLET | ORAL | 0 refills | Status: DC

## 2018-02-27 NOTE — Assessment & Plan Note (Signed)
Taking OTC omeprazole several times weekly with resolve in symptoms. Discussed the importance of a healthy diet and regular exercise in order for weight loss, and to reduce GERD symptoms.  Will resume omeprazole 20 mg daily x 3 months, then re-evaluate. Consider continuing omeprazole vs starting Zantac. He will update.

## 2018-02-27 NOTE — Progress Notes (Signed)
Subjective:    Patient ID: Gerald [name]   DOB: 08-15-1986, 32 y.o.   MRN: [id number]  HPI  Gerald [name] is a 32 year old male who presents today with multiple complaints.  1) Headaches: Occurring once to twice weekly for which he'll wake up with in the morning. Headaches will be consistent and will last for most of the day. His headaches are typically located to the right temporal region and also behind the right eye. He will experience sensitivity to light during these headaches. He'll have to lay down and close his eyes for improvement/relief. He's taken Excedrin Migraine in the past which causes him to feel "queasy". Will take Aleve OTC with improvement if he rests/closes his eyes, sometimes this is ineffective.   Headaches have been present since his early teenage years, recently worse and/or more persistent. He thinks he was trialed on preventative medication around the age of 68, he could not tolerate it as it made him feel horrible. He's not sure which medication this was.  He has been under more stress recently with work. About one year ago he took a prescription strength Aleve from co-worker which resolved his headaches.   2) GERD: Present for years. History of laparoscopic cholecystectomy in June 2017. Symptoms of esophageal burning, especially at night. Sometimes with left sided chest discomfort. He will take omeprazole once to twice weekly on average with resolve of symptoms. He's starting to become aware and avoiding triggers including pizza sauce, hot wings.   Review of Systems  Constitutional: Negative for fever.  Eyes: Positive for photophobia. Negative for visual disturbance.  Gastrointestinal: Negative for abdominal pain, diarrhea and nausea.       GERD  Neurological: Positive for headaches. Negative for dizziness.       Past Medical History:  Diagnosis Date  . Anxiety   . Complication of anesthesia   . Difficult intubation   . Headache    migraiones  . Lyme  disease      Social History   Socioeconomic History  . Marital status: Married    Spouse name: Not on file  . Number of children: Not on file  . Years of education: Not on file  . Highest education level: Not on file  Occupational History  . Occupation: Event organiser: Production manager  Social Needs  . Financial resource strain: Not on file  . Food insecurity:    Worry: Not on file    Inability: Not on file  . Transportation needs:    Medical: Not on file    Non-medical: Not on file  Tobacco Use  . Smoking status: Never Smoker  . Smokeless tobacco: Never Used  Substance and Sexual Activity  . Alcohol use: No  . Drug use: No  . Sexual activity: Not on file  Lifestyle  . Physical activity:    Days per week: Not on file    Minutes per session: Not on file  . Stress: Not on file  Relationships  . Social connections:    Talks on phone: Not on file    Gets together: Not on file    Attends religious service: Not on file    Active member of club or organization: Not on file    Attends meetings of clubs or organizations: Not on file    Relationship status: Not on file  . Intimate partner violence:    Fear of current or ex partner: Not on file    Emotionally  abused: Not on file    Physically abused: Not on file    Forced sexual activity: Not on file  Other Topics Concern  . Not on file  Social History Narrative   Agricultural consultant for [employer]   Married   2 kids, 3rd child died of hypoplastic L heart 13-Feb-2012    Past Surgical History:  Procedure Laterality Date  . CHOLECYSTECTOMY N/A 04/08/2016   Procedure: LAPAROSCOPIC CHOLECYSTECTOMY WITH INTRAOPERATIVE CHOLANGIOGRAM;  Surgeon: Ricarda Frame, MD;  Location: [facility] ORS;  Service: General;  Laterality: N/A;  . TONSILLECTOMY AND ADENOIDECTOMY  Feb 12, 1990    Family History  Problem Relation Age of Onset  . Drug abuse Mother   . Hypertension Mother   . Diabetes Father     Allergies  Allergen Reactions  . Amoxicillin  Hives    Has patient had a PCN reaction causing immediate rash, facial/tongue/throat swelling, SOB or lightheadedness with hypotension: Yes Has patient had a PCN reaction causing severe rash involving mucus membranes or skin necrosis: Yes Has patient had a PCN reaction that required hospitalization No Has patient had a PCN reaction occurring within the last 10 years: No If all of the above answers are "NO", then may proceed with Cephalosporin use.     Current Outpatient Medications on File Prior to Visit  Medication Sig Dispense Refill  . aspirin-acetaminophen-caffeine (EXCEDRIN MIGRAINE) 250-250-65 MG per tablet Take by mouth every 6 (six) hours as needed for headache. Reported on 10/14/2015    . Naproxen Sod-Diphenhydramine (ALEVE PM PO) Take 1 tablet by mouth at bedtime.     No current facility-administered medications on file prior to visit.     BP 122/82   Pulse 89   Temp 97.8 F (36.6 C) (Oral)   Ht  (1.803 m)   Wt 299 lb (135.6 kg)   SpO2 97%   BMI 41.70 kg/m    Objective:   Physical Exam  Constitutional: He appears well-nourished.  Eyes: EOM are normal.  Cardiovascular: Normal rate and regular rhythm.  Pulmonary/Chest: Effort normal and breath sounds normal.  Neurological: No cranial nerve deficit.          Assessment & Plan:

## 2018-02-27 NOTE — Assessment & Plan Note (Signed)
Since early teenage years, more frequent/noticable recently.   Given that headaches are once weekly on average, will start with Naproxen 500 mg BID PRN and have him update.   Consider propranolol vs amitriptyline HS for migraine prevention if needed. He will update.

## 2018-02-27 NOTE — Patient Instructions (Signed)
Start omeprazole 20 mg capsules for heartburn. Take 1 capsule by mouth everyday. Please notify me if your symptoms persist despite omeprazole.   You may take the naproxen as needed for severe headaches. Make sure to take with food. Please update me if no improvement and/or if headaches persist.  It was a pleasure to see you today!

## 2018-03-10 ENCOUNTER — Encounter: Payer: Self-pay | Admitting: Primary Care

## 2018-03-10 DIAGNOSIS — F411 Generalized anxiety disorder: Secondary | ICD-10-CM

## 2018-03-12 MED ORDER — HYDROXYZINE HCL 10 MG PO TABS: ORAL_TABLET | ORAL | 0 refills | Status: DC

## 2018-03-23 ENCOUNTER — Encounter: Payer: Self-pay | Admitting: Primary Care

## 2018-05-05 ENCOUNTER — Other Ambulatory Visit: Payer: Self-pay | Admitting: Primary Care

## 2018-05-05 DIAGNOSIS — K2100 Gastro-esophageal reflux disease with esophagitis, without bleeding: Secondary | ICD-10-CM

## 2018-05-16 ENCOUNTER — Encounter: Payer: Self-pay | Admitting: Primary Care

## 2018-05-17 ENCOUNTER — Encounter: Payer: Self-pay | Admitting: Family Medicine

## 2018-05-17 ENCOUNTER — Ambulatory Visit: Payer: BLUE CROSS/BLUE SHIELD | Admitting: Family Medicine

## 2018-05-17 VITALS — BP 132/82 | HR 96 | Temp 98.4°F | Ht 71.0 in | Wt 299.5 lb

## 2018-05-17 DIAGNOSIS — R0981 Nasal congestion: Secondary | ICD-10-CM | POA: Diagnosis not present

## 2018-05-17 DIAGNOSIS — R0789 Other chest pain: Secondary | ICD-10-CM | POA: Diagnosis not present

## 2018-05-17 DIAGNOSIS — H9201 Otalgia, right ear: Secondary | ICD-10-CM | POA: Diagnosis not present

## 2018-05-17 NOTE — Progress Notes (Signed)
Subjective:    Patient ID: Gerald [name], male    DOB: [birth year], 32 y.o.   MRN: [id number]  HPI This is a 32 yo male who presents today with otalgia of right ear x 2 days. Pain sharp down into jaw. Some runny nose. Relief with naproxen x 1. Feels congested.  Had sore throat 6 days ago.  Daughter with recent cold.  Intermittent left sided chest pain for years, worse when he stopped his omeprazole. Last episode several weeks ago while shopping in [employer]. Always in same spot on chest, can apply counter pressure with relief. One episode that went through to back. Never with exertion- built a deck, walked around [employer], swam.  Civil Service fast streamer for [employer].   Past Medical History:  Diagnosis Date  . Anxiety   . Complication of anesthesia   . Difficult intubation   . Headache    migraiones  . Lyme disease    Past Surgical History:  Procedure Laterality Date  . CHOLECYSTECTOMY N/A 04/08/2016   Procedure: LAPAROSCOPIC CHOLECYSTECTOMY WITH INTRAOPERATIVE CHOLANGIOGRAM;  Surgeon: Ricarda Frame, MD;  Location: [facility] ORS;  Service: General;  Laterality: N/A;  . TONSILLECTOMY AND ADENOIDECTOMY  ~1991   Family History  Problem Relation Age of Onset  . Drug abuse Mother   . Hypertension Mother   . Diabetes Father    Social History   Tobacco Use  . Smoking status: Never Smoker  . Smokeless tobacco: Never Used  Substance Use Topics  . Alcohol use: No  . Drug use: No      Review of Systems Per HPI    Objective:   Physical Exam  Constitutional: He is oriented to person, place, and time. He appears well-developed and well-nourished. No distress.  HENT:  Head: Normocephalic and atraumatic.  Right Ear: Tympanic membrane, external ear and ear canal normal. No tenderness. No mastoid tenderness.  Left Ear: Tympanic membrane, external ear and ear canal normal. No tenderness. No mastoid tenderness.  Nose: Mucosal edema present. No rhinorrhea. Right sinus exhibits maxillary sinus  tenderness. Right sinus exhibits no frontal sinus tenderness. Left sinus exhibits maxillary sinus tenderness. Left sinus exhibits no frontal sinus tenderness.  Mouth/Throat: Posterior oropharyngeal erythema (mild) present. No oropharyngeal exudate or posterior oropharyngeal edema.  Audible nasal congestion.   Eyes: Conjunctivae are normal.  Neck: Normal range of motion. Neck supple.  Cardiovascular: Normal rate, regular rhythm and normal heart sounds.  Pulmonary/Chest: Effort normal and breath sounds normal.  Lymphadenopathy:    He has no cervical adenopathy.  Neurological: He is alert and oriented to person, place, and time.  Skin: Skin is warm and dry. He is not diaphoretic.  Psychiatric: He has a normal mood and affect. His behavior is normal. Judgment and thought content normal.  Vitals reviewed.     BP 132/82 (BP Location: Right Arm, Patient Position: Sitting, Cuff Size: Large)   Pulse 96   Temp 98.4 F (36.9 C) (Oral)   Ht 5\' 11"  (1.803 m)   Wt 299 lb 8 oz (135.9 kg)   SpO2 97%   BMI 41.77 kg/m  Wt Readings from Last 3 Encounters:  05/17/18 299 lb 8 oz (135.9 kg)  02/27/18 299 lb (135.6 kg)  07/19/17 296 lb 1.9 oz (134.3 kg)       Assessment & Plan:  1. Sinus congestion - discussed use of decongestant/Afrin type nasal spray - good fluid intake  2. Otalgia of right ear - good relief with naproxen, suspect related to #1 as exam  benign  3. Intermittent left-sided chest pain - does not sound cardiac in nature, likely related to his chronic GERD - reviewed RTC/ER precautions   Olean Reeeborah Ovida Delagarza, FNP-BC  [facility], [facility]  05/17/2018 4:31 PM

## 2018-05-17 NOTE — Patient Instructions (Signed)
Good to see you today  I think you have some blockage of your ear tubes from your sinuses  Take an over the counter decongestant (sudafed or generic) twice a day as needed and use Afrin type nasal spray twice a day for maximum of 3 days  Drink enough fluids to make urine light yellow  Can take 1-2 Alleve every 12 hours for pain

## 2018-06-30 DIAGNOSIS — R519 Headache, unspecified: Secondary | ICD-10-CM

## 2018-06-30 DIAGNOSIS — K2100 Gastro-esophageal reflux disease with esophagitis, without bleeding: Secondary | ICD-10-CM

## 2018-06-30 DIAGNOSIS — R51 Headache: Principal | ICD-10-CM

## 2018-06-30 MED ORDER — OMEPRAZOLE 20 MG PO CPDR: DELAYED_RELEASE_CAPSULE | ORAL | 1 refills | Status: DC

## 2018-06-30 MED ORDER — NAPROXEN 500 MG PO TABS: ORAL_TABLET | ORAL | 0 refills | Status: DC

## 2018-10-06 DIAGNOSIS — R51 Headache: Principal | ICD-10-CM

## 2018-10-06 DIAGNOSIS — R519 Headache, unspecified: Secondary | ICD-10-CM

## 2018-10-06 MED ORDER — NAPROXEN 500 MG PO TABS: ORAL_TABLET | ORAL | 0 refills | Status: DC

## 2018-11-03 ENCOUNTER — Encounter: Payer: Self-pay | Admitting: Family Medicine

## 2018-11-03 ENCOUNTER — Ambulatory Visit: Payer: BLUE CROSS/BLUE SHIELD | Admitting: Family Medicine

## 2018-11-03 VITALS — BP 122/84 | HR 83 | Temp 98.8°F | Ht 71.0 in | Wt 305.5 lb

## 2018-11-03 DIAGNOSIS — R21 Rash and other nonspecific skin eruption: Secondary | ICD-10-CM | POA: Diagnosis not present

## 2018-11-03 DIAGNOSIS — Z131 Encounter for screening for diabetes mellitus: Secondary | ICD-10-CM

## 2018-11-03 LAB — GLUCOSE, RANDOM: Glucose, Bld: 86 mg/dL (ref 70–99)

## 2018-11-03 NOTE — Progress Notes (Signed)
His weight is going up and he isn't physically active.  We talked about trying to get some activity worked into his day.    Rash.  He had used antifungal spray on his groin.  That helps but it tends to return.  Itchy.  No other rash anywhere else.  He had prev noted a possible lump on testicle but that resolved about a year ago.    No one else with similar rash.  No new soaps.  No FCNAVD.  He was frustrated with return of sx.    Meds, vitals, and allergies reviewed.   ROS: Per HPI unless specifically indicated in ROS section   nad ncat Neck supple, no LA rrr ctab Abd soft, not ttp Chronic irritation on the scrotum laterally (bilateral), with the appearance typical for a superficial fungal infection.  No groin changes o/w.  No testicle mass bilaterally. No penile discharge.

## 2018-11-03 NOTE — Patient Instructions (Addendum)
Go to the lab on the way out.  We'll contact you with your lab report.  Use a small amount of ketoconazole cream and hydrocortisone cream on the rash daily.    Update [place] as needed.   Take care.  Glad to see you.

## 2018-11-04 ENCOUNTER — Other Ambulatory Visit: Payer: Self-pay | Admitting: Primary Care

## 2018-11-04 DIAGNOSIS — R519 Headache, unspecified: Secondary | ICD-10-CM

## 2018-11-04 DIAGNOSIS — K219 Gastro-esophageal reflux disease without esophagitis: Secondary | ICD-10-CM

## 2018-11-04 DIAGNOSIS — R51 Headache: Secondary | ICD-10-CM

## 2018-11-05 DIAGNOSIS — R21 Rash and other nonspecific skin eruption: Secondary | ICD-10-CM | POA: Insufficient documentation

## 2018-11-05 NOTE — Assessment & Plan Note (Signed)
It looks like he is having recurrent scrotal rash.  Discussed with patient about options.  Reasonable to check sugar today to make sure he is not diabetic.  Otherwise reasonable to try an over-the-counter antifungal cream such as ketoconazole in combination with hydrocortisone to help with irritation.  Discussed with patient.  Update [place] as needed.  He agrees.

## 2018-11-07 MED ORDER — OMEPRAZOLE 40 MG PO CPDR: 40.0000 mg | DELAYED_RELEASE_CAPSULE | Freq: Every day | ORAL | 1 refills | Status: DC

## 2018-11-07 NOTE — Telephone Encounter (Signed)
Last prescribed on 10/06/2018.  Last office visit on 11/03/2018 with Dr Para March. No future appointment

## 2018-11-07 NOTE — Telephone Encounter (Signed)
Mychart message sent to patient regarding refill request.

## 2018-12-12 DIAGNOSIS — F331 Major depressive disorder, recurrent, moderate: Secondary | ICD-10-CM | POA: Diagnosis not present

## 2018-12-18 ENCOUNTER — Encounter: Payer: Self-pay | Admitting: Family Medicine

## 2018-12-18 ENCOUNTER — Ambulatory Visit: Payer: BLUE CROSS/BLUE SHIELD | Admitting: Family Medicine

## 2018-12-18 VITALS — BP 118/80 | HR 97 | Resp 14 | Ht 71.0 in | Wt 301.0 lb

## 2018-12-18 DIAGNOSIS — S0502XA Injury of conjunctiva and corneal abrasion without foreign body, left eye, initial encounter: Secondary | ICD-10-CM

## 2018-12-18 MED ORDER — GENTAMICIN SULFATE 0.3 % OP OINT: TOPICAL_OINTMENT | Freq: Three times a day (TID) | OPHTHALMIC | 0 refills | Status: DC

## 2018-12-18 NOTE — Patient Instructions (Signed)
Good to see you today  I have sent an antibiotic ointment to your pharmacy, use for 7 days  If you have any worsening of symptoms or are not feeling less irritation in 48 hours, please follow up with this office or your eye doctor.    Corneal Abrasion  A corneal abrasion is a scratch or injury to the clear covering over the front of your eye (cornea). This can be painful. It is important to get treatment for a corneal abrasion. If this problem is not treated, it can affect your eyesight (vision). Follow these instructions at home: Medicines  Use eye drops or ointments as told by your doctor.  If you were prescribed antibiotic drops or ointment, use them as told by your doctor. Do not stop using the antibiotic even if you start to feel better.  Take over-the-counter and prescription medicines only as told by your doctor.  Do not drive or use heavy machinery while taking prescription pain medicine. General instructions  If you have an eye patch, wear it as told by your doctor. ? Do not drive or use machinery while wearing an eye patch. ? Follow instructions from your doctor about when to take off the patch.  Ask your doctor if you can use a cold, wet cloth (compress) on your eye to help with pain.  Do not rub or touch your eye. Do not wash out your eye.  Do not wear contact lenses until your doctor says that this is okay.  Avoid bright light.  Avoid straining your eyes.  Keep all follow-up visits as told by your doctor. Doing this can help to prevent infection and loss of eyesight. Contact a doctor if:  You continue to have eye pain and other symptoms for more than 2 days.  You get new symptoms, such as: ? Redness. ? Watery eyes (tearing). ? Discharge.  You have discharge that makes your eyelids stick together in the morning.  Symptoms come back after your eye heals. Get help right away if:  You have very bad eye pain that does not get better with medicine.  You  lose eyesight. Summary  A corneal abrasion is a scratch or injury to the clear covering over the front of your eye (cornea).  It is important to get treatment for a corneal abrasion. If this problem is not treated, it can affect your eyesight (vision).  Use eye drops or ointments as told by your doctor.  If you have an eye patch, do not drive or use machinery while wearing it. This information is not intended to replace advice given to you by your health care provider. Make sure you discuss any questions you have with your health care provider. Document Released: 03/15/2008 Document Revised: 09/11/2016 Document Reviewed: 09/11/2016 Elsevier Interactive Patient Education  2019 [employer].

## 2018-12-18 NOTE — Progress Notes (Signed)
Subjective:    Patient ID: Gerald [name], male    DOB: [birth year], 33 y.o.   MRN: [id number]  HPI This is a 33 yo male who presents today with left eye irritation x 2 days. He did woodworking (putting studs into wall- no eye protection) and shaved his head yesterday. Irritation started a couple of hours later. Thought he got something in his eye. Sensation of foreign body. No pain. No visual changes. Has had watery drainage and matting of lashes when he awoke this am. Wears contacts, removed them. Has not put them back in. Used some eye drops that he had at home, they were very old, offered temporary improvement.     Past Medical History:  Diagnosis Date  . Anxiety   . Complication of anesthesia   . Difficult intubation   . Headache    migraiones  . Lyme disease    Past Surgical History:  Procedure Laterality Date  . CHOLECYSTECTOMY N/A 04/08/2016   Procedure: LAPAROSCOPIC CHOLECYSTECTOMY WITH INTRAOPERATIVE CHOLANGIOGRAM;  Surgeon: Ricarda Frame, MD;  Location: [facility] ORS;  Service: General;  Laterality: N/A;  . TONSILLECTOMY AND ADENOIDECTOMY  ~1991   Family History  Problem Relation Age of Onset  . Drug abuse Mother   . Hypertension Mother   . Diabetes Father    Social History   Tobacco Use  . Smoking status: Never Smoker  . Smokeless tobacco: Never Used  Substance Use Topics  . Alcohol use: No  . Drug use: No      Review of Systems Per HPI    Objective:   Physical Exam Vitals signs reviewed.  Constitutional:      General: He is not in acute distress.    Appearance: He is obese. He is not ill-appearing, toxic-appearing or diaphoretic.  HENT:     Head: Normocephalic and atraumatic.  Eyes:     General: Lids are normal. Vision grossly intact. Gaze aligned appropriately. No allergic shiner, visual field deficit or scleral icterus.       Left eye: Discharge (small amount watery) present.No foreign body or hordeolum.     Extraocular Movements:     Right eye:  Normal extraocular motion and no nystagmus.     Left eye: Normal extraocular motion and no nystagmus.     Conjunctiva/sclera:     Left eye: Left conjunctiva is injected (mildly).      Comments: Eye irrigated with sterile saline following fluorescein.   Cardiovascular:     Rate and Rhythm: Normal rate.     Pulses: Normal pulses.  Skin:    General: Skin is warm and dry.  Neurological:     Mental Status: He is alert and oriented to person, place, and time.  Psychiatric:        Mood and Affect: Mood normal.        Behavior: Behavior normal.        Thought Content: Thought content normal.        Judgment: Judgment normal.          BP 118/80 (BP Location: Left Arm, Patient Position: Sitting)   Pulse 97   Resp 14   Ht 5\' 11"  (1.803 m)   Wt (!) 301 lb (136.5 kg)   SpO2 97%   BMI 41.98 kg/m  Wt Readings from Last 3 Encounters:  12/18/18 (!) 301 lb (136.5 kg)  11/03/18 (!) 305 lb 8 oz (138.6 kg)  05/17/18 299 lb 8 oz (135.9 kg)   Visual acuity- 20/20 each eye  and with both (wearing glasses)  Assessment & Plan:  1. Abrasion of left cornea, initial encounter - Provided written and verbal information regarding diagnosis and treatment. - Strict RTC precautions reviewed - gentamicin (GARAMYCIN) 0.3 % ophthalmic ointment; Place into the left eye 3 (three) times daily.  Dispense: 3.5 g; Refill: 0   Olean Ree, FNP-BC  [facility] at [facility], [facility]  12/18/2018 8:15 PM

## 2018-12-18 NOTE — Progress Notes (Signed)
Subjective:    Patient ID: Gerald [name], male    DOB: 1986-07-21, 33 y.o.   MRN: [id number]  HPI This is a 33 yo patient being seen today for left eye issue which began 1 day ago since doing some woodworking at home. Patient removed contact lens in the evening and used some eye drops he had at home without much relief. Still experiencing discomfort today. Non-stop tearing last night. No report of visual changes or vision loss. Has seasonal allergies and takes Zyrtec, Claritin, Flonase  Past Medical History:  Diagnosis Date  . Anxiety   . Complication of anesthesia   . Difficult intubation   . Headache    migraiones  . Lyme disease     Past Surgical History:  Procedure Laterality Date  . CHOLECYSTECTOMY N/A 04/08/2016   Procedure: LAPAROSCOPIC CHOLECYSTECTOMY WITH INTRAOPERATIVE CHOLANGIOGRAM;  Surgeon: Ricarda Frame, MD;  Location: [facility] ORS;  Service: General;  Laterality: N/A;  . TONSILLECTOMY AND ADENOIDECTOMY  ~1991    Family History  Problem Relation Age of Onset  . Drug abuse Mother   . Hypertension Mother   . Diabetes Father     Social History   Tobacco Use  . Smoking status: Never Smoker  . Smokeless tobacco: Never Used  Substance Use Topics  . Alcohol use: No  . Drug use: No       Review of Systems Per HPI    Objective:   Physical Exam Vitals signs reviewed.  Constitutional:      Appearance: Normal appearance.  HENT:     Head: Normocephalic.  Eyes:     Conjunctiva/sclera:     Left eye: Left conjunctiva is injected.     Pupils: Pupils are equal, round, and reactive to light.     Left eye: Corneal abrasion and fluorescein uptake present.     Funduscopic exam:    Right eye: Red reflex present.        Left eye: Red reflex present. Neurological:     Mental Status: He is alert.    BP 118/80 (BP Location: Left Arm, Patient Position: Sitting)   Pulse 97   Resp 14   Ht 5\' 11"  (1.803 m)   Wt (!) 301 lb (136.5 kg)   SpO2 97%   BMI 41.98 kg/m    BP Readings from Last 3 Encounters:  12/18/18 118/80  11/03/18 122/84  05/17/18 132/82    Wt Readings from Last 3 Encounters:  12/18/18 (!) 301 lb (136.5 kg)  11/03/18 (!) 305 lb 8 oz (138.6 kg)  05/17/18 299 lb 8 oz (135.9 kg)          Assessment & Plan:  Abrasion of left cornea, initial encounter - Plan: gentamicin (GARAMYCIN) 0.3 % ophthalmic ointment   Patient Instructions  Good to see you today  I have sent an antibiotic ointment to your pharmacy, use for 7 days  If you have any worsening of symptoms or are not feeling less irritation in 48 hours, please follow up with this office or your eye doctor.    Corneal Abrasion  A corneal abrasion is a scratch or injury to the clear covering over the front of your eye (cornea). This can be painful. It is important to get treatment for a corneal abrasion. If this problem is not treated, it can affect your eyesight (vision). Follow these instructions at home: Medicines  Use eye drops or ointments as told by your doctor.  If you were prescribed antibiotic drops or ointment, use  them as told by your doctor. Do not stop using the antibiotic even if you start to feel better.  Take over-the-counter and prescription medicines only as told by your doctor.  Do not drive or use heavy machinery while taking prescription pain medicine. General instructions  If you have an eye patch, wear it as told by your doctor. ? Do not drive or use machinery while wearing an eye patch. ? Follow instructions from your doctor about when to take off the patch.  Ask your doctor if you can use a cold, wet cloth (compress) on your eye to help with pain.  Do not rub or touch your eye. Do not wash out your eye.  Do not wear contact lenses until your doctor says that this is okay.  Avoid bright light.  Avoid straining your eyes.  Keep all follow-up visits as told by your doctor. Doing this can help to prevent infection and loss of  eyesight. Contact a doctor if:  You continue to have eye pain and other symptoms for more than 2 days.  You get new symptoms, such as: ? Redness. ? Watery eyes (tearing). ? Discharge.  You have discharge that makes your eyelids stick together in the morning.  Symptoms come back after your eye heals. Get help right away if:  You have very bad eye pain that does not get better with medicine.  You lose eyesight. Summary  A corneal abrasion is a scratch or injury to the clear covering over the front of your eye (cornea).  It is important to get treatment for a corneal abrasion. If this problem is not treated, it can affect your eyesight (vision).  Use eye drops or ointments as told by your doctor.  If you have an eye patch, do not drive or use machinery while wearing it. This information is not intended to replace advice given to you by your health care provider. Make sure you discuss any questions you have with your health care provider. Document Released: 03/15/2008 Document Revised: 09/11/2016 Document Reviewed: 09/11/2016 Elsevier Interactive Patient Education  2019 [employer].

## 2018-12-29 ENCOUNTER — Ambulatory Visit: Payer: BLUE CROSS/BLUE SHIELD | Admitting: Family Medicine

## 2019-01-02 NOTE — Telephone Encounter (Signed)
Gerald [name], can we get him set up for a video or phone visit with me? Thank you!

## 2019-01-03 ENCOUNTER — Telehealth (INDEPENDENT_AMBULATORY_CARE_PROVIDER_SITE_OTHER): Payer: BLUE CROSS/BLUE SHIELD | Admitting: Primary Care

## 2019-01-03 ENCOUNTER — Other Ambulatory Visit: Payer: Self-pay

## 2019-01-03 DIAGNOSIS — F411 Generalized anxiety disorder: Secondary | ICD-10-CM | POA: Diagnosis not present

## 2019-01-03 MED ORDER — HYDROXYZINE HCL 25 MG PO TABS: 25.0000 mg | ORAL_TABLET | Freq: Two times a day (BID) | ORAL | 0 refills | Status: DC | PRN

## 2019-01-03 NOTE — Progress Notes (Signed)
   Kawika Zafar - 33 y.o. male  MRN [id number]  Date of Birth: 1986-07-08  PCP: Doreene Nest, NP  This service was provided via telemedicine. Phone Visit performed on 01/03/2019    Rationale for phone visit reviewed. Patient consented to telephone encounter.    Location of patient: Home Location of provider: [place] [facility] Name of referring provider: N/A   Names of persons and role in encounter: Provider: Doreene Nest, NP  Patient: Gerald [name]  Other: N/A   Time on call: 18 minutes and 12 seconds   Subjective: CC: Anxiety HPI:  Mr. Clanin is a 33 year old male with a history of anxiety who presents via telephone with a chief complaint of anxiety. He was previously managed on Lexapro for which he took several years ago but didn't like the way it made him feel. He felt as though the Lexapro prevented him from drawing any emotion at all. He was also managed on hydroxyzine for which he's used as needed for anxiety with improvement.   His symptoms of anxiety began a few weeks ago with the Covid-19 pandemic. Symptoms include difficulty sleeping, feeling anxious, nausea, a "knot" in the stomach, decrease in appetite. He's not afraid of the virus but has had to make a lot of tough decisions in regards to lay off's at work, he's also had to endure a 10% pay cut which caused anxiety. He took four of the hydroxyzine tablets yesterday and really didn't feel relief. He's also taking Melatonin without much improvement. GAD 7 score of 18 today. He is currently seeing a therapist in but has had no recent visit given the pandemic. 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    Objective/Observations:   No physical exam or vital signs collected unless specifically identified.  Respiratory status: speaks in complete sentences without evident shortness of breath.   Assessment/Plan:  Recent increase in anxiety due to  Covid-19. Recommended he trial another SSRI but given his experience with Lexapro he kindly declines. He would like to continue to try hydroxyzine to use as needed as things in his occupation have stabilized. We decided to increase his dose of hydroxyzine to 25 mg, take 1-2 tablets BID PRN. He will call us if his therapist is unable to see patient's and at that point we will get him referred elsewhere. He will update if symptoms progress, we will consider another SSRI at that point. GAD 7 score of 18 today.   No problem-specific Assessment & Plan notes found for this encounter.   I discussed the assessment and treatment plan with the patient. The patient was provided an opportunity to ask questions and all were answered. The patient agreed with the plan and demonstrated an understanding of the instructions.  Lab Orders  No laboratory test(s) ordered today    No orders of the defined types were placed in this encounter.   The patient was advised to call back or seek an in-person evaluation if the symptoms worsen or if the condition fails to improve as anticipated.  Doreene Nest, NP

## 2019-01-03 NOTE — Assessment & Plan Note (Signed)
Recent increase in anxiety due to Covid-19. Recommended he trial another SSRI but given his experience with Lexapro he kindly declines. He would like to continue to try hydroxyzine to use as needed as things in his occupation have stabilized. We decided to increase his dose of hydroxyzine to 25 mg, take 1-2 tablets BID PRN. He will call us if his therapist is unable to see patient's and at that point we will get him referred elsewhere. He will update if symptoms progress, we will consider another SSRI at that point. GAD 7 score of 18 today.

## 2019-01-03 NOTE — Patient Instructions (Signed)
We've increased your hydroxyzine to 25 mg. You may take 1-2 tablets up to twice daily as needed for anxiety.  Please notify me if you have trouble getting in with your therapist.  Please also notify me if your symptoms progress.  It was nice speaking with you! Mayra Reel, NP-C

## 2019-01-09 DIAGNOSIS — F331 Major depressive disorder, recurrent, moderate: Secondary | ICD-10-CM | POA: Diagnosis not present

## 2019-01-16 DIAGNOSIS — F331 Major depressive disorder, recurrent, moderate: Secondary | ICD-10-CM | POA: Diagnosis not present

## 2019-01-31 DIAGNOSIS — F331 Major depressive disorder, recurrent, moderate: Secondary | ICD-10-CM | POA: Diagnosis not present

## 2019-02-15 ENCOUNTER — Other Ambulatory Visit: Payer: Self-pay | Admitting: Primary Care

## 2019-02-15 DIAGNOSIS — R519 Headache, unspecified: Secondary | ICD-10-CM

## 2019-02-15 DIAGNOSIS — R51 Headache: Principal | ICD-10-CM

## 2019-02-15 DIAGNOSIS — K219 Gastro-esophageal reflux disease without esophagitis: Secondary | ICD-10-CM

## 2019-02-16 NOTE — Telephone Encounter (Signed)
naproxen (NAPROSYN) 500 MG tablet Last prescribed on 10/06/2018.   omeprazole (PRILOSEC) 40 MG capsule Last prescribed on 11/07/2018.  Last office visit on 01/03/2019. No future appointment

## 2019-02-16 NOTE — Telephone Encounter (Signed)
Noted, refill sent to pharmacy. 

## 2019-02-21 ENCOUNTER — Other Ambulatory Visit: Payer: Self-pay | Admitting: Primary Care

## 2019-02-21 ENCOUNTER — Other Ambulatory Visit: Payer: Self-pay

## 2019-02-21 ENCOUNTER — Ambulatory Visit: Payer: BLUE CROSS/BLUE SHIELD | Admitting: Primary Care

## 2019-02-21 ENCOUNTER — Encounter: Payer: Self-pay | Admitting: Primary Care

## 2019-02-21 VITALS — BP 136/90 | HR 78 | Temp 98.2°F | Ht 71.0 in | Wt 299.2 lb

## 2019-02-21 DIAGNOSIS — R079 Chest pain, unspecified: Secondary | ICD-10-CM

## 2019-02-21 DIAGNOSIS — F411 Generalized anxiety disorder: Secondary | ICD-10-CM | POA: Diagnosis not present

## 2019-02-21 DIAGNOSIS — E785 Hyperlipidemia, unspecified: Secondary | ICD-10-CM | POA: Diagnosis not present

## 2019-02-21 DIAGNOSIS — R51 Headache: Secondary | ICD-10-CM | POA: Diagnosis not present

## 2019-02-21 DIAGNOSIS — R519 Headache, unspecified: Secondary | ICD-10-CM

## 2019-02-21 DIAGNOSIS — F331 Major depressive disorder, recurrent, moderate: Secondary | ICD-10-CM | POA: Diagnosis not present

## 2019-02-21 LAB — COMPREHENSIVE METABOLIC PANEL
ALT: 54 U/L — ABNORMAL HIGH (ref 0–53)
AST: 32 U/L (ref 0–37)
Albumin: 4.3 g/dL (ref 3.5–5.2)
Alkaline Phosphatase: 102 U/L (ref 39–117)
BUN: 11 mg/dL (ref 6–23)
CO2: 27 mEq/L (ref 19–32)
Calcium: 9 mg/dL (ref 8.4–10.5)
Chloride: 104 mEq/L (ref 96–112)
Creatinine, Ser: 0.96 mg/dL (ref 0.40–1.50)
GFR: 89.43 mL/min (ref >60.00)
Glucose, Bld: 91 mg/dL (ref 70–99)
Potassium: 4.1 mEq/L (ref 3.5–5.1)
Sodium: 138 mEq/L (ref 135–145)
Total Bilirubin: 0.4 mg/dL (ref 0.2–1.2)
Total Protein: 7 g/dL (ref 6.0–8.3)

## 2019-02-21 LAB — LIPID PANEL
Cholesterol: 226 mg/dL — ABNORMAL HIGH (ref 0–200)
HDL: 28.7 mg/dL — ABNORMAL LOW (ref >39.00)
Total CHOL/HDL Ratio: 8
Triglycerides: 517 mg/dL — ABNORMAL HIGH (ref 0.0–149.0)

## 2019-02-21 LAB — LDL CHOLESTEROL, DIRECT: Direct LDL: 124 mg/dL

## 2019-02-21 LAB — TSH: TSH: 1.85 u[IU]/mL (ref 0.35–4.50)

## 2019-02-21 LAB — HEMOGLOBIN A1C: Hgb A1c MFr Bld: 5.6 % (ref 4.6–6.5)

## 2019-02-21 MED ORDER — PROPRANOLOL HCL ER 80 MG PO CP24: 80.0000 mg | ORAL_CAPSULE | Freq: Every day | ORAL | 0 refills | Status: DC

## 2019-02-21 NOTE — Assessment & Plan Note (Signed)
Intermittent for years, continues. Treated for GERD with improvement. Consider anxiety cause but he denies anxiety.  ECG today with Check labs today including CMP, A1C, Lipids, TSH. Consider referral to cardiology once labs return.

## 2019-02-21 NOTE — Assessment & Plan Note (Signed)
Symptoms improved.  Continue to monitor.  

## 2019-02-21 NOTE — Patient Instructions (Addendum)
Stop by the lab prior to leaving today. I will notify you of your results once received.   Start propranolol ER 80 mg capsules once daily for headache prevention.   You will be contacted regarding your referral to Cardiology.  Please let us know if you have not been contacted within one week.   Please update me in a few weeks regarding your headaches.  It was a pleasure to see you today!

## 2019-02-21 NOTE — Assessment & Plan Note (Signed)
Continued and more frequent now, also with 2 migraines monthly. Patient agrees to daily treatment so we will start with propranolol ER 80 mg daily. BP and HR can handle. This may in turn help with intermittent anxiety.   Rx for propranolol ER 80 mg sent to pharmacy. He will update.

## 2019-02-21 NOTE — Assessment & Plan Note (Signed)
Repeat lipid panel pending.  Discussed the importance of a healthy diet and regular exercise in order for weight loss, and to reduce the risk of any potential medical problems.  

## 2019-02-21 NOTE — Progress Notes (Signed)
Subjective:    Patient ID: Gerald NordmannNicholas [name], male    DOB: 07/09/1986, 33 y.o.   MRN: [id number]  HPI  Mr. Gerald [name] is a 33 year old male with a history of anxiety, obesity, GERD, migraines, hypersomnolence who presents today with a chief complaint of chest pain. He's also experiencing headaches and migraines.   1) Chest Pain: His pain is located to the left chest with sometimes radiating to the left upper thoracic back. He notices his pain most everyday. This has been present intermittent for years. He mostly notices his pain at night when resting and watching TV. This past weekend he was putting up a [place] in his back yard, lots of physical activity, and his pain was the same.   He's taken Advil, Aleve, Tylenol with occasional improvement. He is taking 600 mg of Advil twice daily for 5-6 days of the week for recurrent headaches. He's also taking omeprazole for his GERD with improvement in GERD symptoms but not with improvement in chest pain. He denies family history of CAD. He does have a family history of diabetes and hypertension. He does experience intermittent anxiety which has improved over the last month. He is not checking his blood pressure at home.   2) Frequent Headaches/Migraines: Chronic headaches that are typically 2-3 times weekly, but over the last 2 weeks he's experiencing daily headaches and 2 migraines monthly. Historically he will take Naproxen for migraines but this hasn't helped. He's never been treated with anything prescription strength for recurrent headaches or for abortive treatment for migraines.   BP Readings from Last 3 Encounters:  02/21/19 (!) 144/94  12/18/18 118/80  11/03/18 122/84   Wt Readings from Last 3 Encounters:  02/21/19 299 lb 4 oz (135.7 kg)  12/18/18 (!) 301 lb (136.5 kg)  11/03/18 (!) 305 lb 8 oz (138.6 kg)     Review of Systems  Constitutional: Negative for fatigue and fever.  Eyes: Negative for visual disturbance.  Respiratory: Negative for  shortness of breath.   Cardiovascular: Positive for chest pain and palpitations.  Gastrointestinal:       GERD symptoms improved  Neurological: Positive for headaches. Negative for dizziness.  Psychiatric/Behavioral: The patient is not nervous/anxious.        Past Medical History:  Diagnosis Date  . Anxiety   . Complication of anesthesia   . Difficult intubation   . Headache    migraiones  . Lyme disease      Social History   Socioeconomic History  . Marital status: Married    Spouse name: Not on file  . Number of children: Not on file  . Years of education: Not on file  . Highest education level: Not on file  Occupational History  . Occupation: Event organiserManager    Employer: [employer]  Social Needs  . Financial resource strain: Not on file  . Food insecurity:    Worry: Not on file    Inability: Not on file  . Transportation needs:    Medical: Not on file    Non-medical: Not on file  Tobacco Use  . Smoking status: Never Smoker  . Smokeless tobacco: Never Used  Substance and Sexual Activity  . Alcohol use: No  . Drug use: No  . Sexual activity: Not on file  Lifestyle  . Physical activity:    Days per week: Not on file    Minutes per session: Not on file  . Stress: Not on file  Relationships  . Social connections:  Talks on phone: Not on file    Gets together: Not on file    Attends religious service: Not on file    Active member of club or organization: Not on file    Attends meetings of clubs or organizations: Not on file    Relationship status: Not on file  . Intimate partner violence:    Fear of current or ex partner: Not on file    Emotionally abused: Not on file    Physically abused: Not on file    Forced sexual activity: Not on file  Other Topics Concern  . Not on file  Social History Narrative   Agricultural consultant for [employer]   Married   2 kids, 3rd child died of hypoplastic L heart 2012/02/04    Past Surgical History:  Procedure Laterality Date  .  CHOLECYSTECTOMY N/A 04/08/2016   Procedure: LAPAROSCOPIC CHOLECYSTECTOMY WITH INTRAOPERATIVE CHOLANGIOGRAM;  Surgeon: Ricarda Frame, MD;  Location: [facility] ORS;  Service: General;  Laterality: N/A;  . TONSILLECTOMY AND ADENOIDECTOMY  02-03-1990    Family History  Problem Relation Age of Onset  . Drug abuse Mother   . Hypertension Mother   . Diabetes Father     Allergies  Allergen Reactions  . Amoxicillin Hives    Has patient had a PCN reaction causing immediate rash, facial/tongue/throat swelling, SOB or lightheadedness with hypotension: Yes Has patient had a PCN reaction causing severe rash involving mucus membranes or skin necrosis: Yes Has patient had a PCN reaction that required hospitalization No Has patient had a PCN reaction occurring within the last 10 years: No If all of the above answers are "NO", then may proceed with Cephalosporin use.     Current Outpatient Medications on File Prior to Visit  Medication Sig Dispense Refill  . aspirin-acetaminophen-caffeine (EXCEDRIN MIGRAINE) 250-250-65 MG per tablet Take by mouth every 6 (six) hours as needed for headache. Reported on 10/14/2015    . gentamicin (GARAMYCIN) 0.3 % ophthalmic ointment Place into the left eye 3 (three) times daily. 3.5 g 0  . hydrOXYzine (ATARAX/VISTARIL) 25 MG tablet Take 1-2 tablets (25-50 mg total) by mouth 2 (two) times daily as needed for anxiety. 30 tablet 0  . naproxen (NAPROSYN) 500 MG tablet TAKE 1 TABLET BY MOUTH WITH FOOD AS NEEDED FOR HEADACHES 60 tablet 0  . omeprazole (PRILOSEC) 40 MG capsule Take 1 capsule (40 mg total) by mouth daily. For heartburn. 90 capsule 1   No current facility-administered medications on file prior to visit.     BP (!) 144/94   Pulse 78   Temp 98.2 F (36.8 C) (Oral)   Ht 5\' 11"  (1.803 m)   Wt 299 lb 4 oz (135.7 kg)   SpO2 98%   BMI 41.74 kg/m    Objective:   Physical Exam  Constitutional: He appears well-nourished.  Neck: Neck supple.  Cardiovascular: Normal  rate and regular rhythm.  Respiratory: Effort normal and breath sounds normal.  Skin: Skin is warm and dry.  Psychiatric: He has a normal mood and affect.           Assessment & Plan:

## 2019-02-22 DIAGNOSIS — E785 Hyperlipidemia, unspecified: Secondary | ICD-10-CM

## 2019-02-22 MED ORDER — ATORVASTATIN CALCIUM 20 MG PO TABS: 20.0000 mg | ORAL_TABLET | Freq: Every day | ORAL | 0 refills | Status: DC

## 2019-02-23 ENCOUNTER — Telehealth: Payer: Self-pay | Admitting: Cardiovascular Disease

## 2019-02-23 ENCOUNTER — Telehealth (INDEPENDENT_AMBULATORY_CARE_PROVIDER_SITE_OTHER): Payer: BLUE CROSS/BLUE SHIELD | Admitting: Cardiovascular Disease

## 2019-02-23 ENCOUNTER — Other Ambulatory Visit: Payer: Self-pay

## 2019-02-23 DIAGNOSIS — R0789 Other chest pain: Secondary | ICD-10-CM

## 2019-02-23 DIAGNOSIS — G43809 Other migraine, not intractable, without status migrainosus: Secondary | ICD-10-CM

## 2019-02-23 DIAGNOSIS — G43909 Migraine, unspecified, not intractable, without status migrainosus: Secondary | ICD-10-CM | POA: Insufficient documentation

## 2019-02-23 DIAGNOSIS — E782 Mixed hyperlipidemia: Secondary | ICD-10-CM | POA: Diagnosis not present

## 2019-02-23 DIAGNOSIS — R079 Chest pain, unspecified: Secondary | ICD-10-CM

## 2019-02-23 NOTE — Progress Notes (Signed)
Virtual Visit via Video Note   This visit type was conducted due to national recommendations for restrictions regarding the COVID-19 Pandemic (e.g. social distancing) in an effort to limit this patient's exposure and mitigate transmission in our community.  Due to his co-morbid illnesses, this patient is at least at moderate risk for complications without adequate follow up.  This format is felt to be most appropriate for this patient at this time.  All issues noted in this document were discussed and addressed.  A limited physical exam was performed with this format.  Please refer to the patient's chart for his consent to telehealth for [facility].   I connected with  Gerald [name] on 02/23/19 by a video enabled telemedicine application and verified that I am speaking with the correct person using two identifiers. I discussed the limitations of evaluation and management by telemedicine. The patient expressed understanding and agreed to proceed.   Evaluation Performed:  Follow-up visit  Date:  02/23/2019   ID:  Gerald [name], DOB 03/16/1986, MRN [id number]  Patient Location:  [address] [place] [id number]   Provider location[facility]   [facility], [place] office  PCP:  Doreene Nestlark, Katherine K, NP  Cardiologist:  Hubbard RobinsonGollan, [facility]   Chief Complaint: Chest pain   History of Present Illness:    Gerald [name] is a 33 y.o. male who presents via audio/video conferencing for a telehealth visit today.   The patient does not symptoms concerning for COVID-19 infection (fever, chills, cough, or new SHORTNESS OF BREATH).   Patient has a past medical history of Anxiety Hyperlipidemia/beta triglycerides Headaches/migraines, on propranolol Obesity Referred by Vernona RiegerKatherine Clark for consultation of his chest pain  Notes indicating intermittent chest pain coming on for years Feels his pain may have started 3 years ago when he was going to wrestle mania out of town Lifted up  his young child at that time and was doing a " airplane" Put the child down and then felt excruciating chest pain radiating through to back Feels the symptoms never really went away  He is otherwise very active in his backyard, building [place], lawnmowing, does not have any symptoms when he is active Often will feel it at rest or with certain movements, intercostal space seems to go downwards not up like the other ribs More recently has started to appreciate pain in his scapular area on the left about the same level in the back  Has not tried NSAIDs, icing, stretching Today has 3/10 pain, appreciates the discomfort when pushing on left chest with his fingers No relief with PPI  Kids age 277 and 619 Often will roughhouse and play with them with no reproducible pain symptoms  Total cholesterol 226 triglycerides 517 Labs reviewed with him, poor diet, works at [employer] as Production designer, theatre/television/filmmanager  Hemoglobin A1c 5.6   Prior CV studies:   The following studies were reviewed today:  CT scan June 2017 abdomen pelvis with no aortic atherosclerosis Images pulled up and reviewed with him in detail   Past Medical History:  Diagnosis Date  . Anxiety   . Complication of anesthesia   . Difficult intubation   . Headache    migraiones  . Lyme disease    Past Surgical History:  Procedure Laterality Date  . CHOLECYSTECTOMY N/A 04/08/2016   Procedure: LAPAROSCOPIC CHOLECYSTECTOMY WITH INTRAOPERATIVE CHOLANGIOGRAM;  Surgeon: Ricarda Frameharles Woodham, MD;  Location: [facility] ORS;  Service: General;  Laterality: N/A;  . TONSILLECTOMY  AND ADENOIDECTOMY  ~1991     Current Meds  Medication Sig  . aspirin-acetaminophen-caffeine (EXCEDRIN MIGRAINE) 250-250-65 MG per tablet Take by mouth every 6 (six) hours as needed for headache. Reported on 10/14/2015  . atorvastatin (LIPITOR) 20 MG tablet Take 1 tablet (20 mg total) by mouth daily. For cholesterol.  Marland Kitchen gentamicin (GARAMYCIN) 0.3 % ophthalmic ointment Place into  the left eye 3 (three) times daily.  . hydrOXYzine (ATARAX/VISTARIL) 25 MG tablet Take 1-2 tablets (25-50 mg total) by mouth 2 (two) times daily as needed for anxiety.  . naproxen (NAPROSYN) 500 MG tablet TAKE 1 TABLET BY MOUTH WITH FOOD AS NEEDED FOR HEADACHES  . omeprazole (PRILOSEC) 40 MG capsule Take 1 capsule (40 mg total) by mouth daily. For heartburn.  . propranolol ER (INDERAL LA) 80 MG 24 hr capsule TAKE 1 CAPSULE(80 MG) BY MOUTH DAILY FOR HEADACHE PREVENTION     Allergies:   Amoxicillin   Social History   Tobacco Use  . Smoking status: Never Smoker  . Smokeless tobacco: Never Used  Substance Use Topics  . Alcohol use: No  . Drug use: No     Current Outpatient Medications on File Prior to Visit  Medication Sig Dispense Refill  . aspirin-acetaminophen-caffeine (EXCEDRIN MIGRAINE) 250-250-65 MG per tablet Take by mouth every 6 (six) hours as needed for headache. Reported on 10/14/2015    . atorvastatin (LIPITOR) 20 MG tablet Take 1 tablet (20 mg total) by mouth daily. For cholesterol. 90 tablet 0  . gentamicin (GARAMYCIN) 0.3 % ophthalmic ointment Place into the left eye 3 (three) times daily. 3.5 g 0  . hydrOXYzine (ATARAX/VISTARIL) 25 MG tablet Take 1-2 tablets (25-50 mg total) by mouth 2 (two) times daily as needed for anxiety. 30 tablet 0  . naproxen (NAPROSYN) 500 MG tablet TAKE 1 TABLET BY MOUTH WITH FOOD AS NEEDED FOR HEADACHES 60 tablet 0  . omeprazole (PRILOSEC) 40 MG capsule Take 1 capsule (40 mg total) by mouth daily. For heartburn. 90 capsule 1  . propranolol ER (INDERAL LA) 80 MG 24 hr capsule TAKE 1 CAPSULE(80 MG) BY MOUTH DAILY FOR HEADACHE PREVENTION 90 capsule 0   No current facility-administered medications on file prior to visit.      Family Hx: The patient's family history includes Diabetes in his father; Drug abuse in his mother; Hypertension in his mother.  ROS:   Please see the history of present illness.    Review of Systems  Constitutional:  Negative.   HENT: Negative.   Respiratory: Negative.   Cardiovascular: Negative.        Left side chest pain, reproducible on palpation  Gastrointestinal: Negative.   Musculoskeletal: Negative.        Scapular pain, appreciated on palpation  Neurological: Negative.   Psychiatric/Behavioral: Negative.   All other systems reviewed and are negative.     Labs/Other Tests and Data Reviewed:    Recent Labs: 02/21/2019: ALT 54; BUN 11; Creatinine, Ser 0.96; Potassium 4.1; Sodium 138; TSH 1.85   Recent Lipid Panel Lab Results  Component Value Date/Time   CHOL 226 (H) 02/21/2019 11:34 AM   TRIG (H) 02/21/2019 11:34 AM    517.0 Triglyceride is over 400; calculations on Lipids are invalid.   HDL 28.70 (L) 02/21/2019 11:34 AM   CHOLHDL 8 02/21/2019 11:34 AM   LDLDIRECT 124.0 02/21/2019 11:34 AM    Wt Readings from Last 3 Encounters:  02/21/19 299 lb 4 oz (135.7 kg)  12/18/18 (!) 301 lb (136.5 kg)  11/03/18 (!) 305 lb 8 oz (138.6 kg)     Exam:    Vital Signs: Vital signs may also be detailed in the HPI There were no vitals taken for this visit.  Wt Readings from Last 3 Encounters:  02/21/19 299 lb 4 oz (135.7 kg)  12/18/18 (!) 301 lb (136.5 kg)  11/03/18 (!) 305 lb 8 oz (138.6 kg)   Temp Readings from Last 3 Encounters:  02/21/19 98.2 F (36.8 C) (Oral)  11/03/18 98.8 F (37.1 C) (Oral)  05/17/18 98.4 F (36.9 C) (Oral)   BP Readings from Last 3 Encounters:  02/21/19 136/90  12/18/18 118/80  11/03/18 122/84   Pulse Readings from Last 3 Encounters:  02/21/19 78  12/18/18 97  11/03/18 83    120/80, pulse 85, respirations 16  Well nourished, well developed male in no acute distress. Constitutional:  oriented to person, place, and time. No distress.  Head: Normocephalic and atraumatic.  Eyes:  no discharge. No scleral icterus.  Neck: Normal range of motion. Neck supple.  Pulmonary/Chest: No audible wheezing, no distress, appears comfortable Musculoskeletal: Normal  range of motion.  no  tenderness or deformity.  Neurological:   Coordination normal. Full exam not performed Skin:  No rash Psychiatric:  normal mood and affect. behavior is normal. Thought content normal.    ASSESSMENT & PLAN:    Morbid obesity (HCC) We have encouraged continued exercise, careful diet management in an effort to lose weight. Given his elevated triglycerides, recommended he cut back on soda, sweet tea, breads, pasta, high carbohydrate foods Start a walking program  Chest pain of uncertain etiology Atypical pain, likely musculoskeletal Likely rib pain several intercostal spaces down on the left also with discomfort in left scapular region Suggested icing, NSAIDs, seek assistance from chiropractic Initial injury likely happened several years ago after lifting his child up into the air with subsequent severe similar pain  Mixed hyperlipidemia Reports he is scheduled to start Lipitor Discussed risk and benefit of the medication Suggested he watch for myalgias Recommended drastic dietary changes in effort to decrease his triglycerides, rec mended weight loss Recent CT scan 3 years ago with no aortic atherosclerosis noted.  These details were discussed with him  Other migraine without status migrainosus, not intractable On propranolol Would likely continue the medication as blood pressure heart rate well controlled   Patient was seen in consultation for Mayra Reel and will be referred back to her office for ongoing care of the issues detailed above   COVID-19 Education: The signs and symptoms of COVID-19 were discussed with the patient and how to seek care for testing (follow up with PCP or arrange E-visit).  The importance of social distancing was discussed today.  Patient Risk:   After full review of this patients clinical status, I feel that they are at least moderate risk at this time.  Time:   Today, I have spent 60 minutes with the patient with telehealth  technology discussing the cardiac and medical problems/diagnoses detailed above   10 min spent reviewing the chart prior to patient visit today   Medication Adjustments/Labs and Tests Ordered: Current medicines are reviewed at length with the patient today.  Concerns regarding medicines are outlined above.   Tests Ordered: No tests ordered   Medication Changes: No changes made   Disposition: Follow-up  as needed   Signed, Julien Nordmann, MD  02/23/2019 5:04 PM    [facility] [place] Office [address], [place],  [place] [address]

## 2019-02-23 NOTE — Patient Instructions (Addendum)
For chest pain use icing, NSAIDs Likely musculoskeletal in nature Consider chiropractic  Medication Instructions:  No changes  If you need a refill on your cardiac medications before your next appointment, please call your pharmacy.    Lab work: No new labs needed   If you have labs (blood work) drawn today and your tests are completely normal, you will receive your results only by: Marland Kitchen MyChart Message (if you have MyChart) OR . A paper copy in the mail If you have any lab test that is abnormal or we need to change your treatment, we will call you to review the results.   Testing/Procedures: No new testing needed   Follow-Up: At [facility], you and your health needs are our priority.  As part of our continuing mission to provide you with exceptional heart care, we have created designated Provider Care Teams.  These Care Teams include your primary Cardiologist (physician) and Advanced Practice Providers (APPs -  Physician Assistants and Nurse Practitioners) who all work together to provide you with the care you need, when you need it.  . You will need a follow up appointment as needed  . Providers on your designated Care Team:   . Nicolasa Ducking, NP . Eula Listen, PA-C . Marisue Ivan, PA-C  Any Other Special Instructions Will Be Listed Below (If Applicable).  For educational health videos Log in to : [email or web address] Or : [email or web address], password : [email or web address]

## 2019-02-25 NOTE — Telephone Encounter (Signed)
Virtual Visit Pre-Appointment Phone Call  "(Name), I am calling you today to discuss your upcoming appointment. We are currently trying to limit exposure to the virus that causes COVID-19 by seeing patients at home rather than in the office."  1. "What is the BEST phone number to call the day of the visit?" - include this in appointment notes  2. Do you have or have access to (through a family member/friend) a smartphone with video capability that we can use for your visit?" a. If yes - list this number in appt notes as cell (if different from BEST phone #) and list the appointment type as a VIDEO visit in appointment notes b. If no - list the appointment type as a PHONE visit in appointment notes  3. Confirm consent - "In the setting of the current Covid19 crisis, you are scheduled for a (phone or video) visit with your provider on (date) at (time).  Just as we do with many in-office visits, in order for you to participate in this visit, we must obtain consent.  If you'd like, I can send this to your mychart (if signed up) or email for you to review.  Otherwise, I can obtain your verbal consent now.  All virtual visits are billed to your insurance company just like a normal visit would be.  By agreeing to a virtual visit, we'd like you to understand that the technology does not allow for your provider to perform an examination, and thus may limit your provider's ability to fully assess your condition. If your provider identifies any concerns that need to be evaluated in person, we will make arrangements to do so.  Finally, though the technology is pretty good, we cannot assure that it will always work on either your or our end, and in the setting of a video visit, we may have to convert it to a phone-only visit.  In either situation, we cannot ensure that we have a secure connection.  Are you willing to proceed?" STAFF: Did the patient verbally acknowledge consent to telehealth visit? Document  YES/NO here: yes  4. Advise patient to be prepared - "Two hours prior to your appointment, go ahead and check your blood pressure, pulse, oxygen saturation, and your weight (if you have the equipment to check those) and write them all down. When your visit starts, your provider will ask you for this information. If you have an Apple Watch or Kardia device, please plan to have heart rate information ready on the day of your appointment. Please have a pen and paper handy nearby the day of the visit as well."  5. Give patient instructions for MyChart download to smartphone OR [email or web address] as below if video visit (depending on what platform provider is using)  6. Inform patient they will receive a phone call 15 minutes prior to their appointment time (may be from unknown caller ID) so they should be prepared to answer    TELEPHONE CALL NOTE  Dailon Perrin has been deemed a candidate for a follow-up tele-health visit to limit community exposure during the Covid-19 pandemic. I spoke with the patient via phone to ensure availability of phone/video source, confirm preferred email & phone number, and discuss instructions and expectations.  I reminded Wofford Billock to be prepared with any vital sign and/or heart rhythm information that could potentially be obtained via home monitoring, at the time of his visit. I reminded Gerald [name] to expect a phone call prior to his visit.  Gerald MaxcyJennifer B Harkins 02/25/2019 9:01 PM   INSTRUCTIONS FOR DOWNLOADING THE MYCHART APP TO SMARTPHONE  - The patient must first make sure to have activated MyChart and know their login information - If Apple, go to [employer] Store and type in MyChart in the search bar and download the app. If Android, ask patient to go to [employer] Play Store and type in HustlerMyChart in the search bar and download the app. The app is free but as with any other app downloads, their phone may require them to verify saved payment information or Apple/Android  password.  - The patient will need to then log into the app with their MyChart username and password, and select [facility] as their healthcare provider to link the account. When it is time for your visit, go to the MyChart app, find appointments, and click Begin Video Visit. Be sure to Select Allow for your device to access the Microphone and Camera for your visit. You will then be connected, and your provider will be with you shortly.  **If they have any issues connecting, or need assistance please contact MyChart service desk [phone])**  **If using a computer, in order to ensure the best quality for their visit they will need to use either of the following Internet Browsers: [employer] Edge, or Google Chrome**  IF USING DOXIMITY or DOXY.ME - The patient will receive a link just prior to their visit by text.     FULL LENGTH CONSENT FOR TELE-HEALTH VISIT   I hereby voluntarily request, consent and authorize [facility] and its employed or contracted physicians, physician assistants, nurse practitioners or other licensed health care professionals (the Practitioner), to provide me with telemedicine health care services (the Services") as deemed necessary by the treating Practitioner. I acknowledge and consent to receive the Services by the Practitioner via telemedicine. I understand that the telemedicine visit will involve communicating with the Practitioner through live audiovisual communication technology and the disclosure of certain medical information by electronic transmission. I acknowledge that I have been given the opportunity to request an in-person assessment or other available alternative prior to the telemedicine visit and am voluntarily participating in the telemedicine visit.  I understand that I have the right to withhold or withdraw my consent to the use of telemedicine in the course of my care at any time, without affecting my right to future care or treatment,  and that the Practitioner or I may terminate the telemedicine visit at any time. I understand that I have the right to inspect all information obtained and/or recorded in the course of the telemedicine visit and may receive copies of available information for a reasonable fee.  I understand that some of the potential risks of receiving the Services via telemedicine include:   Delay or interruption in medical evaluation due to technological equipment failure or disruption;  Information transmitted may not be sufficient (e.g. poor resolution of images) to allow for appropriate medical decision making by the Practitioner; and/or   In rare instances, security protocols could fail, causing a breach of personal health information.  Furthermore, I acknowledge that it is my responsibility to provide information about my medical history, conditions and care that is complete and accurate to the best of my ability. I acknowledge that Practitioner's advice, recommendations, and/or decision may be based on factors not within their control, such as incomplete or inaccurate data provided by me or distortions of diagnostic images or specimens that may result from electronic transmissions. I understand that the  practice of medicine is not an Chief Strategy Officer and that Practitioner makes no warranties or guarantees regarding treatment outcomes. I acknowledge that I will receive a copy of this consent concurrently upon execution via email to the email address I last provided but may also request a printed copy by calling the office of [facility].    I understand that my insurance will be billed for this visit.   I have read or had this consent read to me.  I understand the contents of this consent, which adequately explains the benefits and risks of the Services being provided via telemedicine.   I have been provided ample opportunity to ask questions regarding this consent and the Services and have had my questions  answered to my satisfaction.  I give my informed consent for the services to be provided through the use of telemedicine in my medical care  By participating in this telemedicine visit I agree to the above.

## 2019-02-27 DIAGNOSIS — F331 Major depressive disorder, recurrent, moderate: Secondary | ICD-10-CM | POA: Diagnosis not present

## 2019-03-28 DIAGNOSIS — F331 Major depressive disorder, recurrent, moderate: Secondary | ICD-10-CM | POA: Diagnosis not present

## 2019-04-04 DIAGNOSIS — F331 Major depressive disorder, recurrent, moderate: Secondary | ICD-10-CM | POA: Diagnosis not present

## 2019-04-10 DIAGNOSIS — F331 Major depressive disorder, recurrent, moderate: Secondary | ICD-10-CM | POA: Diagnosis not present

## 2019-06-20 ENCOUNTER — Other Ambulatory Visit: Payer: Self-pay | Admitting: Primary Care

## 2019-06-20 DIAGNOSIS — E785 Hyperlipidemia, unspecified: Secondary | ICD-10-CM

## 2019-07-24 ENCOUNTER — Encounter: Payer: Self-pay | Admitting: Family Medicine

## 2019-07-24 ENCOUNTER — Other Ambulatory Visit: Payer: Self-pay

## 2019-07-24 ENCOUNTER — Ambulatory Visit: Payer: BLUE CROSS/BLUE SHIELD | Admitting: Family Medicine

## 2019-07-24 VITALS — BP 130/82 | HR 82 | Temp 98.3°F | Ht 71.0 in | Wt 284.3 lb

## 2019-07-24 DIAGNOSIS — R1013 Epigastric pain: Secondary | ICD-10-CM | POA: Diagnosis not present

## 2019-07-24 DIAGNOSIS — K219 Gastro-esophageal reflux disease without esophagitis: Secondary | ICD-10-CM | POA: Diagnosis not present

## 2019-07-24 DIAGNOSIS — K625 Hemorrhage of anus and rectum: Secondary | ICD-10-CM

## 2019-07-24 HISTORY — DX: Hemorrhage of anus and rectum: K62.5

## 2019-07-24 HISTORY — DX: Epigastric pain: R10.13

## 2019-07-24 MED ORDER — HYDROCORTISONE ACETATE 25 MG RE SUPP: 25.0000 mg | Freq: Two times a day (BID) | RECTAL | 0 refills | Status: DC

## 2019-07-24 MED ORDER — OMEPRAZOLE 40 MG PO CPDR: 40.0000 mg | DELAYED_RELEASE_CAPSULE | Freq: Two times a day (BID) | ORAL | 3 refills | Status: DC

## 2019-07-24 NOTE — Progress Notes (Addendum)
This visit was conducted in person.  BP 130/82   Pulse 82   Temp 98.3 F (36.8 C)   Ht 5\' 11"  (1.803 m)   Wt 284 lb 5 oz (129 kg)   SpO2 97%   BMI 39.65 kg/m    CC: abd pain Subjective:    Patient ID: Gerald [name], [name]    DOB: 27-Jan-1986, 33 y.o.   MRN: [id number]  HPI: Gerald [name] is a 33 y.o. [name] presenting on 07/24/2019 for Abdominal Pain (the past 2 weeks more intense. noticed blood on the toilet paerp after bowel movement.)   Epigastric abd discomfort for years associated with anxiety. Over last 2 weeks noticing worsening epigastric pain associated with stress/anxiety. Pain described as burning pain, associated with nausea, decreased appetite. Cold drink or pressure on abdomen provides relief. Hydroxyzine helps anxiety but doesn't help abdominal pain. Last night noted worse 7/10 ribcage burning pain - treated with tums without benefit. This morning noticed significant blood on toilet paper with some clots when wiping - less severe episodes of bloody stool happen 2-3 times a year when Gerald [name]'s stressed. Denies h/o hemorrhoids.  Currently on omeprazole 40mg  daily without benefit. Sometimes takes 2 omeprazole at a time.  On aleve 220mg  3 a day for joint aches after active work day.  Caffeine - 60-100oz/day of diet sodas.  No alcohol.   No fevers/chills, vomiting, no other abd pain, no boring pain to back, dysphagia, early satiety.  Gerald [name] has lost weight - more physical job, more active.   Gerald [name] did switch jobs 03/2019 - noted worsening abd pain increased stress coming on from new job.   S/p cholecystectomy.  No fmhx IBD.      Relevant past medical, surgical, family and social history reviewed and updated as indicated. Interim medical history since our last visit reviewed. Allergies and medications reviewed and updated. Outpatient Medications Prior to Visit  Medication Sig Dispense Refill  . atorvastatin (LIPITOR) 20 MG tablet TAKE 1 TABLET(20 MG) BY MOUTH DAILY FOR CHOLESTEROL 90  tablet 0  . hydrOXYzine (ATARAX/VISTARIL) 25 MG tablet Take 1-2 tablets (25-50 mg total) by mouth 2 (two) times daily as needed for anxiety. 30 tablet 0  . propranolol ER (INDERAL LA) 80 MG 24 hr capsule TAKE 1 CAPSULE(80 MG) BY MOUTH DAILY FOR HEADACHE PREVENTION 90 capsule 0  . aspirin-acetaminophen-caffeine (EXCEDRIN MIGRAINE) 710-626-94 MG per tablet Take by mouth every 6 (six) hours as needed for headache. Reported on 10/14/2015    . gentamicin (GARAMYCIN) 0.3 % ophthalmic ointment Place into the left eye 3 (three) times daily. 3.5 g 0  . naproxen (NAPROSYN) 500 MG tablet TAKE 1 TABLET BY MOUTH WITH FOOD AS NEEDED FOR HEADACHES 60 tablet 0  . omeprazole (PRILOSEC) 40 MG capsule Take 1 capsule (40 mg total) by mouth daily. For heartburn. 90 capsule 1   No facility-administered medications prior to visit.      Per HPI unless specifically indicated in ROS section below Review of Systems Objective:    BP 130/82   Pulse 82   Temp 98.3 F (36.8 C)   Ht 5\' 11"  (1.803 m)   Wt 284 lb 5 oz (129 kg)   SpO2 97%   BMI 39.65 kg/m   Wt Readings from Last 3 Encounters:  07/24/19 284 lb 5 oz (129 kg)  02/21/19 299 lb 4 oz (135.7 kg)  12/18/18 (!) 301 lb (136.5 kg)    Physical Exam Vitals signs and nursing note reviewed.  Constitutional:  General: Gerald [name] is not in acute distress.    Appearance: Normal appearance. Gerald [name] is obese. Gerald [name] is not ill-appearing.  HENT:     Mouth/Throat:     Mouth: Mucous membranes are moist.     Pharynx: Oropharynx is clear. No posterior oropharyngeal erythema.  Cardiovascular:     Rate and Rhythm: Normal rate and regular rhythm.     Pulses: Normal pulses.     Heart sounds: Normal heart sounds. No murmur.  Pulmonary:     Effort: Pulmonary effort is normal. No respiratory distress.     Breath sounds: Normal breath sounds. No wheezing, rhonchi or rales.  Abdominal:     General: Abdomen is flat. Bowel sounds are normal. There is no distension.     Palpations:  Abdomen is soft. There is no mass.     Tenderness: There is abdominal tenderness (moderate) in the epigastric area. There is no right CVA tenderness, left CVA tenderness, guarding or rebound. Negative signs include Murphy's sign.     Hernia: No hernia is present.  Genitourinary:    Rectum: Internal hemorrhoid (possible R sided) present. No mass, tenderness, anal fissure or external hemorrhoid.     Comments: Perirectal skin inflammation R>L Musculoskeletal:     Right lower leg: No edema.     Left lower leg: No edema.  Neurological:     Mental Status: Gerald [name] is alert.  Psychiatric:        Mood and Affect: Mood normal.        Behavior: Behavior normal.       Results for orders placed or performed in visit on 02/21/19  Comprehensive metabolic panel  Result Value Ref Range   Sodium 138 135 - 145 mEq/L   Potassium 4.1 3.5 - 5.1 mEq/L   Chloride 104 96 - 112 mEq/L   CO2 27 19 - 32 mEq/L   Glucose, Bld 91 70 - 99 mg/dL   BUN 11 6 - 23 mg/dL   Creatinine, Ser 0.94 0.40 - 1.50 mg/dL   Total Bilirubin 0.4 0.2 - 1.2 mg/dL   Alkaline Phosphatase 102 39 - 117 U/L   AST 32 0 - 37 U/L   ALT 54 (H) 0 - 53 U/L   Total Protein 7.0 6.0 - 8.3 g/dL   Albumin 4.3 3.5 - 5.2 g/dL   Calcium 9.0 8.4 - 70.9 mg/dL   GFR 62.83 >66.29 mL/min  Lipid panel  Result Value Ref Range   Cholesterol 226 (H) 0 - 200 mg/dL   Triglycerides (H) 0.0 - 149.0 mg/dL    476.5 Triglyceride is over 400; calculations on Lipids are invalid.   HDL 28.70 (L) >39.00 mg/dL   Total CHOL/HDL Ratio 8   Hemoglobin A1c  Result Value Ref Range   Hgb A1c MFr Bld 5.6 4.6 - 6.5 %  TSH  Result Value Ref Range   TSH 1.85 0.35 - 4.50 uIU/mL  LDL cholesterol, direct  Result Value Ref Range   Direct LDL 124.0 mg/dL   Assessment & Plan:   Problem List Items Addressed This Visit    GERD (gastroesophageal reflux disease)   Relevant Medications   omeprazole (PRILOSEC) 40 MG capsule   Epigastric abdominal pain - Primary    Anticipate  gastritis related - reviewed with patient. Change NSAID to tylenol for aches/pains, limit caffeine, increase omeprazole to 40mg  bid x3 wks then back to QD dosing. Update with effect. Pt agrees with plan.       Relevant Orders   Comprehensive metabolic  panel   Sedimentation rate   CBC with Differential/Platelet   CALPROTECTIN   Lipase   BRBPR (bright red blood per rectum)    ?int hemorrhoid related bleed vs IBD given recurrent episode associated with abd pain. Check fecal calprotectin as well as labwork. Pt agrees with plan.           Meds ordered this encounter  Medications  . omeprazole (PRILOSEC) 40 MG capsule    Sig: Take 1 capsule (40 mg total) by mouth 2 (two) times daily. For heartburn.    Dispense:  60 capsule    Refill:  3  . hydrocortisone (ANUSOL-HC) 25 MG suppository    Sig: Place 1 suppository (25 mg total) rectally 2 (two) times daily.    Dispense:  12 suppository    Refill:  0   Orders Placed This Encounter  Procedures  . CALPROTECTIN  . Comprehensive metabolic panel  . Sedimentation rate  . CBC with Differential/Platelet  . Lipase    Patient instructions: I think abdominal pain is coming from gastritis.  Limit caffeine. Change aleve to tylenol.  Increase omeprazole to 40mg  twice daily for the next 3  weeks.  With blood in stool, I'd like to further evaluate for inflammatory bowel disease with labs and stool test.  Blood could be coming from internal hemorrhoid - I've sent steroid suppository if ongoing bleeding.  We will be in touch with results and plan.   Follow up plan: No follow-ups on file.  Eustaquio BoydenJavier Marik Sedore, MD

## 2019-07-24 NOTE — Assessment & Plan Note (Signed)
?  int hemorrhoid related bleed vs IBD given recurrent episode associated with abd pain. Check fecal calprotectin as well as labwork. Pt agrees with plan.

## 2019-07-24 NOTE — Patient Instructions (Addendum)
I think abdominal pain is coming from gastritis.  Limit caffeine. Change aleve to tylenol.  Increase omeprazole to 40mg  twice daily for the next 3  weeks.  With blood in stool, I'd like to further evaluate for inflammatory bowel disease with labs and stool test.  Blood could be coming from internal hemorrhoid - I've sent steroid suppository if ongoing bleeding.  We will be in touch with results and plan.   Gastritis, Adult Gastritis is inflammation of the stomach. There are two kinds of gastritis:  Acute gastritis. This kind develops suddenly.  Chronic gastritis. This kind is much more common and lasts for a long time. Gastritis happens when the lining of the stomach becomes weak or gets damaged. Without treatment, gastritis can lead to stomach bleeding and ulcers. What are the causes? This condition may be caused by:  An infection.  Drinking too much alcohol.  Certain medicines. These include steroids, antibiotics, and some over-the-counter medicines, such as aspirin or ibuprofen.  Having too much acid in the stomach.  A disease of the intestines or stomach.  Stress.  An allergic reaction.  Crohn's disease.  Some cancer treatments (radiation). Sometimes the cause of this condition is not known. What are the signs or symptoms? Symptoms of this condition include:  Pain or a burning sensation in the upper abdomen.  Nausea.  Vomiting.  An uncomfortable feeling of fullness after eating.  Weight loss.  Bad breath.  Blood in your vomit or stools. In some cases, there are no symptoms. How is this diagnosed? This condition may be diagnosed with:  Your medical history and a description of your symptoms.  A physical exam.  Tests. These can include: ? Blood tests. ? Stool tests. ? A test in which a thin, flexible instrument with a light and a camera is passed down the esophagus and into the stomach (upper endoscopy). ? A test in which a sample of tissue is taken  for testing (biopsy). How is this treated? This condition may be treated with medicines. The medicines that are used vary depending on the cause of the gastritis:  If the condition is caused by a bacterial infection, you may be given antibiotic medicines.  If the condition is caused by too much acid in the stomach, you may be given medicines called H2 blockers, proton pump inhibitors, or antacids. Treatment may also involve stopping the use of certain medicines, such as aspirin, ibuprofen, or other NSAIDs. Follow these instructions at home: Medicines  Take over-the-counter and prescription medicines only as told by your health care provider.  If you were prescribed an antibiotic medicine, take it as told by your health care provider. Do not stop taking the antibiotic even if you start to feel better. Eating and drinking   Eat small, frequent meals instead of large meals.  Avoid foods and drinks that make your symptoms worse.  Drink enough fluid to keep your urine pale yellow. Alcohol use  Do not drink alcohol if: ? Your health care provider tells you not to drink. ? You are pregnant, may be pregnant, or are planning to become pregnant.  If you drink alcohol: ? Limit your use to:  0-1 drink a day for women.  0-2 drinks a day for men. ? Be aware of how much alcohol is in your drink. In the [place] one drink equals one 12 oz bottle of beer (355 mL), one 5 oz glass of wine (148 mL), or one 1 oz glass of hard liquor (44 mL).  General instructions  Talk with your health care provider about ways to manage stress, such as getting regular exercise or practicing deep breathing, meditation, or yoga.  Do not use any products that contain nicotine or tobacco, such as cigarettes and e-cigarettes. If you need help quitting, ask your health care provider.  Keep all follow-up visits as told by your health care provider. This is important. Contact a health care provider if:  Your symptoms get  worse.  Your symptoms return after treatment. Get help right away if:  You vomit blood or material that looks like coffee grounds.  You have black or dark red stools.  You are unable to keep fluids down.  Your abdominal pain gets worse.  You have a fever.  You do not feel better after one week. Summary  Gastritis is inflammation of the lining of the stomach that can occur suddenly (acute) or develop slowly over time (chronic).  This condition is diagnosed with a medical history, a physical exam, or tests.  This condition may be treated with medicines to treat infection or medicines to reduce the amount of acid in your stomach.  Follow your health care provider's instructions about taking medicines, making changes to your diet, and knowing when to call for help. This information is not intended to replace advice given to you by your health care provider. Make sure you discuss any questions you have with your health care provider. Document Released: 09/21/2001 Document Revised: 02/14/2018 Document Reviewed: 02/14/2018 Elsevier Patient Education  2020 [employer].

## 2019-07-24 NOTE — Assessment & Plan Note (Addendum)
Anticipate gastritis related - reviewed with patient. Change NSAID to tylenol for aches/pains, limit caffeine, increase omeprazole to 40mg  bid x3 wks then back to QD dosing. Update with effect. Pt agrees with plan.

## 2019-07-25 ENCOUNTER — Telehealth: Payer: Self-pay

## 2019-07-25 ENCOUNTER — Encounter: Payer: Self-pay | Admitting: Family Medicine

## 2019-07-25 LAB — CBC WITH DIFFERENTIAL/PLATELET
Basophils Absolute: 0.1 10*3/uL (ref 0.0–0.1)
Basophils Relative: 0.6 % (ref 0.0–3.0)
Eosinophils Absolute: 0.2 10*3/uL (ref 0.0–0.7)
Eosinophils Relative: 1.3 % (ref 0.0–5.0)
HCT: 44.6 % (ref 39.0–52.0)
Hemoglobin: 14.8 g/dL (ref 13.0–17.0)
Lymphocytes Relative: 24.4 % (ref 12.0–46.0)
Lymphs Abs: 3 10*3/uL (ref 0.7–4.0)
MCHC: 33.1 g/dL (ref 30.0–36.0)
MCV: 81.3 fl (ref 78.0–100.0)
Monocytes Absolute: 0.6 10*3/uL (ref 0.1–1.0)
Monocytes Relative: 4.7 % (ref 3.0–12.0)
Neutro Abs: 8.4 10*3/uL — ABNORMAL HIGH (ref 1.4–7.7)
Neutrophils Relative %: 69 % (ref 43.0–77.0)
Platelets: 316 10*3/uL (ref 150.0–400.0)
RBC: 5.49 Mil/uL (ref 4.22–5.81)
RDW: 14.7 % (ref 11.5–15.5)
WBC: 12.2 10*3/uL — ABNORMAL HIGH (ref 4.0–10.5)

## 2019-07-25 LAB — COMPREHENSIVE METABOLIC PANEL
ALT: 38 U/L (ref 0–53)
AST: 21 U/L (ref 0–37)
Albumin: 4.4 g/dL (ref 3.5–5.2)
Alkaline Phosphatase: 108 U/L (ref 39–117)
BUN: 13 mg/dL (ref 6–23)
CO2: 26 mEq/L (ref 19–32)
Calcium: 9.6 mg/dL (ref 8.4–10.5)
Chloride: 105 mEq/L (ref 96–112)
Creatinine, Ser: 0.97 mg/dL (ref 0.40–1.50)
GFR: 88.15 mL/min (ref >60.00)
Glucose, Bld: 129 mg/dL — ABNORMAL HIGH (ref 70–99)
Potassium: 4 mEq/L (ref 3.5–5.1)
Sodium: 141 mEq/L (ref 135–145)
Total Bilirubin: 0.4 mg/dL (ref 0.2–1.2)
Total Protein: 6.6 g/dL (ref 6.0–8.3)

## 2019-07-25 LAB — SEDIMENTATION RATE: Sed Rate: 6 mm/hr (ref 0–15)

## 2019-07-25 LAB — LIPASE: Lipase: 22 U/L (ref 11.0–59.0)

## 2019-07-25 NOTE — Telephone Encounter (Signed)
Pt left v/m that he was seen on 07/24/19. Pt said needs prior auth to [phone] for omeprazole. Pt is allowed omeprazole # 66 for the year unless PA is done. Pt request cb when done.

## 2019-07-25 NOTE — Telephone Encounter (Signed)
Submitted PA for omeprazole 40 mg, key:  [id number].  Decision pending.   Left message for pt to call back.  Need to notify him PA was submitted.

## 2019-07-31 LAB — CALPROTECTIN: Calprotectin: 118 mcg/g

## 2019-08-01 ENCOUNTER — Other Ambulatory Visit: Payer: Self-pay | Admitting: Family Medicine

## 2019-08-01 DIAGNOSIS — R1013 Epigastric pain: Secondary | ICD-10-CM

## 2019-08-01 DIAGNOSIS — K625 Hemorrhage of anus and rectum: Secondary | ICD-10-CM

## 2019-08-01 NOTE — Telephone Encounter (Addendum)
Spoke pt's ins about omeprazole PA. Submitted on the phn and approved, valid 08/01/2019- 07/31/2022.

## 2019-08-08 ENCOUNTER — Telehealth: Payer: Self-pay

## 2019-08-08 NOTE — Telephone Encounter (Signed)
Used K62.5 Bright rred blood per rectum.  Faxed PA appeal with correct dx code.  Decision pending.

## 2019-08-08 NOTE — Telephone Encounter (Signed)
What was diagnosis used? Can you complete PA with dx K64.8 int hemorrhoid?

## 2019-08-08 NOTE — Telephone Encounter (Signed)
Received PA denial for Anucort- HC 25 mg sup stating:  Plan exclusion- Non- FDA approved. Fyi to Dr. Darnell Level.

## 2019-08-10 ENCOUNTER — Other Ambulatory Visit: Payer: Self-pay | Admitting: Primary Care

## 2019-08-10 DIAGNOSIS — R519 Headache, unspecified: Secondary | ICD-10-CM

## 2019-08-10 NOTE — Telephone Encounter (Signed)
Received faxed PA form with correct dx code, K64.8.  Placed form in Dr. Synthia Innocent box.

## 2019-08-15 NOTE — Telephone Encounter (Signed)
Filled and in Lisa's box 

## 2019-08-15 NOTE — Telephone Encounter (Signed)
Faxed form.  Decision pending.  

## 2019-08-29 NOTE — Telephone Encounter (Addendum)
Received faxed PA approval valid 08/15/2019-11/15/2019.

## 2019-09-24 ENCOUNTER — Ambulatory Visit: Payer: BLUE CROSS/BLUE SHIELD | Admitting: Gastroenterology

## 2019-09-24 ENCOUNTER — Encounter: Payer: Self-pay | Admitting: Gastroenterology

## 2019-10-09 ENCOUNTER — Encounter: Payer: Self-pay | Admitting: Primary Care

## 2019-10-18 ENCOUNTER — Other Ambulatory Visit: Payer: Self-pay

## 2019-10-18 ENCOUNTER — Ambulatory Visit: Payer: BLUE CROSS/BLUE SHIELD | Admitting: Primary Care

## 2019-10-18 ENCOUNTER — Telehealth: Payer: Self-pay | Admitting: Primary Care

## 2019-10-18 NOTE — Telephone Encounter (Signed)
Tried to call patient and left message. Also sent the [email or web address] link to mobile number.  No respond from patient

## 2019-11-02 LAB — SARS CORONAVIRUS 2 AG (30 MIN TAT): SARS Coronavirus 2 Ag: NEGATIVE

## 2019-11-08 ENCOUNTER — Ambulatory Visit (INDEPENDENT_AMBULATORY_CARE_PROVIDER_SITE_OTHER): Payer: BLUE CROSS/BLUE SHIELD | Admitting: Primary Care

## 2019-11-08 ENCOUNTER — Other Ambulatory Visit: Payer: Self-pay

## 2019-11-08 ENCOUNTER — Encounter: Payer: Self-pay | Admitting: Primary Care

## 2019-11-08 DIAGNOSIS — R519 Headache, unspecified: Secondary | ICD-10-CM

## 2019-11-08 MED ORDER — PROPRANOLOL HCL ER 120 MG PO CP24: 120.0000 mg | ORAL_CAPSULE | Freq: Every day | ORAL | 0 refills | Status: DC

## 2019-11-08 NOTE — Telephone Encounter (Signed)
Patient evaluated virtually

## 2019-11-08 NOTE — Progress Notes (Signed)
Subjective:    Patient ID: Gerald [name], male    DOB: 1986/02/04, 34 y.o.   MRN: [id number]  HPI  Virtual Visit via Video Note  I connected with Gerald [name] on 11/08/19 at 10:40 AM EST by a video enabled telemedicine application and verified that I am speaking with the correct person using two identifiers.  Location: Patient: Home Provider: Office   I discussed the limitations of evaluation and management by telemedicine and the availability of in person appointments. The patient expressed understanding and agreed to proceed.  History of Present Illness:  Gerald [name] is a 34 year old male with a history of migraine, GERD, fatigue, anxiety disorder who presents today with a chief complaint of headache.  His headache is located behind his right eye that began about 2 weeks ago. He's taken Tylenol, Ibuprofen, Zyrtec, and Flonase without improvement. He then took Sudafed and 30 minutes his pain was relieved temporarily. Since then he's been taking Sudafed daily for two weeks.   He denies nasal congestion, fatigue, fevers, rhinorrhea. He's noticed slightl post nasal drip, and mostly clear mucous from his nasal cavity.He does have photophobia and phonophobia when his headache pressure gets worse.   He ran out of his propranolol ER 80 mg a few weeks ago, he never refilled this as he felt as though it wasn't helping with headaches any longer. Initially his headaches were reduced after taking Propranolol but over the last month he's noticed that Propranolol wasn't as effective.    Observations/Objective:  Alert and oriented. Appears well, not sickly. No distress. Speaking in complete sentences.   Assessment and Plan:  Suspect symptoms today are secondary to a cluster headache, especially given lack of other symptoms for sinusitis and also since he stopped his propranolol.   Discussed to stop Sudafed and that he may have rebound symptoms. Will resume and also increase Propranolol to 120  mg to see if this helps. I did offer him a course of prednisone for which he kindly declines. He will update in a few weeks, sooner if he develops other symptoms of sinusitis.   Follow Up Instructions:  Gerald [name] resumed and increased the dose of your propranolol to 120 mg. Take this once daily for headache prevention.  Please update me if you develop runny nose, nasal congestion, fevers, fatigue.  Please update me in a few weeks regarding your headaches.  It was a pleasure to see you today! Gerald Reel, NP-C    I discussed the assessment and treatment plan with the patient. The patient was provided an opportunity to ask questions and all were answered. The patient agreed with the plan and demonstrated an understanding of the instructions.   The patient was advised to call back or seek an in-person evaluation if the symptoms worsen or if the condition fails to improve as anticipated.     Doreene Nest, NP    Review of Systems  Constitutional: Negative for fever.  HENT: Positive for postnasal drip. Negative for congestion, ear pain, sinus pressure, sinus pain and sore throat.   Eyes: Positive for photophobia.  Respiratory: Negative for cough.   Neurological: Positive for headaches.       Past Medical History:  Diagnosis Date  . Anxiety   . Complication of anesthesia   . Difficult intubation   . Headache    migraiones  . Lyme disease      Social History   Socioeconomic History  . Marital status: Married    Spouse name:  Not on file  . Number of children: Not on file  . Years of education: Not on file  . Highest education level: Not on file  Occupational History  . Occupation: Event organiser: Production manager  Tobacco Use  . Smoking status: Never Smoker  . Smokeless tobacco: Never Used  Substance and Sexual Activity  . Alcohol use: No  . Drug use: No  . Sexual activity: Not on file  Other Topics Concern  . Not on file  Social History Narrative   Customer service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 [employer]   Married   2 kids, 3rd child died of hypoplastic L heart 01/23/2012   Social Determinants of Health   Financial Resource Strain:   . Difficulty of Paying Living Expenses: Not on file  Food Insecurity:   . Worried About Programme researcher, broadcasting/film/video in the Last Year: Not on file  . Ran Out of Food in the Last Year: Not on file  Transportation Needs:   . Lack of Transportation (Medical): Not on file  . Lack of Transportation (Non-Medical): Not on file  Physical Activity:   . Days of Exercise per Week: Not on file  . Minutes of Exercise per Session: Not on file  Stress:   . Feeling of Stress : Not on file  Social Connections:   . Frequency of Communication with Friends and Family: Not on file  . Frequency of Social Gatherings with Friends and Family: Not on file  . Attends Religious Services: Not on file  . Active Member of Clubs or Organizations: Not on file  . Attends Banker Meetings: Not on file  . Marital Status: Not on file  Intimate Partner Violence:   . Fear of Current or Ex-Partner: Not on file  . Emotionally Abused: Not on file  . Physically Abused: Not on file  . Sexually Abused: Not on file    Past Surgical History:  Procedure Laterality Date  . CHOLECYSTECTOMY N/A 04/08/2016   Procedure: LAPAROSCOPIC CHOLECYSTECTOMY WITH INTRAOPERATIVE CHOLANGIOGRAM;  Surgeon: Ricarda Frame, MD;  Location: [facility] ORS;  Service: General;  Laterality: N/A;  . TONSILLECTOMY AND ADENOIDECTOMY  01-22-90    Family History  Problem Relation Age of Onset  . Drug abuse Mother   . Hypertension Mother   . Diabetes Father     Allergies  Allergen Reactions  . Amoxicillin Hives    Has patient had a PCN reaction causing immediate rash, facial/tongue/throat swelling, SOB or lightheadedness with hypotension: Yes Has patient had a PCN reaction causing severe rash involving mucus membranes or skin necrosis: Yes Has patient had a PCN reaction that required hospitalization  No Has patient had a PCN reaction occurring within the last 10 years: No If all of the above answers are "NO", then may proceed with Cephalosporin use.     Current Outpatient Medications on File Prior to Visit  Medication Sig Dispense Refill  . atorvastatin (LIPITOR) 20 MG tablet TAKE 1 TABLET(20 MG) BY MOUTH DAILY FOR CHOLESTEROL 90 tablet 0  . hydrOXYzine (ATARAX/VISTARIL) 25 MG tablet Take 1-2 tablets (25-50 mg total) by mouth 2 (two) times daily as needed for anxiety. 30 tablet 0  . omeprazole (PRILOSEC) 40 MG capsule Take 1 capsule (40 mg total) by mouth 2 (two) times daily. For heartburn. 60 capsule 3  . [DISCONTINUED] propranolol ER (INDERAL LA) 80 MG 24 hr capsule TAKE 1 CAPSULE(80 MG) BY MOUTH DAILY FOR HEADACHE PREVENTION 90 capsule 1  . hydrocortisone (ANUSOL-HC) 25 MG  suppository Place 1 suppository (25 mg total) rectally 2 (two) times daily. (Patient not taking: Reported on 11/08/2019) 12 suppository 0   No current facility-administered medications on file prior to visit.    There were no vitals taken for this visit.   Objective:   Physical Exam  Constitutional: He is oriented to person, place, and time. He appears well-nourished. He does not have a sickly appearance. He does not appear ill.  Respiratory: Effort normal.  No cough  Neurological: He is alert and oriented to person, place, and time.  Psychiatric: He has a normal mood and affect.           Assessment & Plan:

## 2019-11-08 NOTE — Assessment & Plan Note (Signed)
Initial improvement with propranolol ER 80 mg, then less effective. Resume and increase dose to 120 mg daily. He will update in a few weeks.

## 2019-11-08 NOTE — Patient Instructions (Signed)
We've resumed and increased the dose of your propranolol to 120 mg. Take this once daily for headache prevention.  Please update me if you develop runny nose, nasal congestion, fevers, fatigue.  Please update me in a few weeks regarding your headaches.  It was a pleasure to see you today! Mayra Reel, NP-C

## 2019-12-31 ENCOUNTER — Ambulatory Visit: Payer: BLUE CROSS/BLUE SHIELD | Attending: Internal Medicine

## 2019-12-31 DIAGNOSIS — Z23 Encounter for immunization: Secondary | ICD-10-CM

## 2019-12-31 NOTE — Progress Notes (Signed)
   Covid-19 Vaccination Clinic  Name:  Ark Agrusa    MRN: [id number] DOB: Nov 23, 1985  12/31/2019  Mr. Bobe was observed post Covid-19 immunization for 15 minutes without incident. He was provided with Vaccine Information Sheet and instruction to access the V-Safe system.   Mr. Italiano was instructed to call 911 with any severe reactions post vaccine: Marland Kitchen Difficulty breathing  . Swelling of face and throat  . A fast heartbeat  . A bad rash all over body  . Dizziness and weakness   Immunizations Administered    Name Date Dose VIS Date Route   Pfizer COVID-19 Vaccine 12/31/2019  6:43 PM 0.3 mL 09/21/2019 Intramuscular   Manufacturer: [employer], [employer]   Lot: [id number]   NDC: [address]-7

## 2020-01-25 ENCOUNTER — Other Ambulatory Visit: Payer: Self-pay

## 2020-01-25 ENCOUNTER — Ambulatory Visit: Payer: BLUE CROSS/BLUE SHIELD | Attending: Internal Medicine

## 2020-01-25 DIAGNOSIS — Z23 Encounter for immunization: Secondary | ICD-10-CM

## 2020-01-25 NOTE — Progress Notes (Signed)
   Covid-19 Vaccination Clinic  Name:  Gerald [name]    MRN: [id number] DOB: 04-14-1986  01/25/2020  [name] was observed post Covid-19 immunization for 15 minutes without incident. He was provided with Vaccine Information Sheet and instruction to access the V-Safe system.   [name] was instructed to call 911 with any severe reactions post vaccine: Marland Kitchen Difficulty breathing  . Swelling of face and throat  . A fast heartbeat  . A bad rash all over body  . Dizziness and weakness   Immunizations Administered    Name Date Dose VIS Date Route   Pfizer COVID-19 Vaccine 01/25/2020  5:50 PM 0.3 mL 09/21/2019 Intramuscular   Manufacturer: [employer], [employer]   Lot: [id number]   NDC: [address]-7

## 2020-04-10 DIAGNOSIS — F411 Generalized anxiety disorder: Secondary | ICD-10-CM

## 2020-04-10 DIAGNOSIS — G4700 Insomnia, unspecified: Secondary | ICD-10-CM

## 2020-04-15 ENCOUNTER — Encounter: Payer: Self-pay | Admitting: Primary Care

## 2020-04-15 ENCOUNTER — Ambulatory Visit (INDEPENDENT_AMBULATORY_CARE_PROVIDER_SITE_OTHER): Payer: BLUE CROSS/BLUE SHIELD | Admitting: Primary Care

## 2020-04-15 ENCOUNTER — Other Ambulatory Visit: Payer: Self-pay

## 2020-04-15 VITALS — BP 144/92 | HR 90 | Temp 96.9°F | Ht 71.0 in | Wt 286.8 lb

## 2020-04-15 DIAGNOSIS — F411 Generalized anxiety disorder: Secondary | ICD-10-CM | POA: Diagnosis not present

## 2020-04-15 DIAGNOSIS — K219 Gastro-esophageal reflux disease without esophagitis: Secondary | ICD-10-CM

## 2020-04-15 MED ORDER — TRAZODONE HCL 50 MG PO TABS: 25.0000 mg | ORAL_TABLET | Freq: Every evening | ORAL | 0 refills | Status: DC | PRN

## 2020-04-15 MED ORDER — OMEPRAZOLE 20 MG PO CPDR: 20.0000 mg | DELAYED_RELEASE_CAPSULE | Freq: Every day | ORAL | 0 refills | Status: DC

## 2020-04-15 MED ORDER — CITALOPRAM HYDROBROMIDE 20 MG PO TABS: 20.0000 mg | ORAL_TABLET | Freq: Every day | ORAL | 1 refills | Status: DC

## 2020-04-15 NOTE — Progress Notes (Signed)
Subjective:    Patient ID: Gerald [name], male    DOB: [birth year], 34 y.o.   MRN: [id number]  HPI  This visit occurred during the SARS-CoV-2 public health emergency.  Safety protocols were in place, including screening questions prior to the visit, additional usage of staff PPE, and extensive cleaning of exam room while observing appropriate contact time as indicated for disinfecting solutions.   Gerald [name] is a 34 year old male with a history of GERD, anxiety disorder, epigastric pain who presents today with a chief complaint of anxiety.  Chronic history of intermittent anxiety over the years, currently not on daily treatment.  Over the last two months symptoms of anxiety have increased including abdominal burning with "knot sensation", feeling nervous, worry, thinking worst case scenario, difficulty sleeping. When he feels in control of his environment, he has no anxiety. As lack of control occurs, anxiety inflates. When everything is going according to plan and is on schedule, he's fine.   He has been with his new company since late 02/11/19, overall has done well and is under much less stress.  Over the last 2 months he has had to deal with 2 difficult employees which caused him a lot of anxiety.  He has been taking hydroxyzine more frequently over the last several weeks at night due to inability to sleep.  He cannot shut off his mind and relax.  The hydroxyzine helps to put him asleep, but he will wake up around 1 AM with inability to fall back asleep.    He was once managed on Lexapro, which helped slightly with anxiety, but because an odd sensation during use.  After a few months of taking Lexapro he felt the effects wore off. The addition of Buspar wasn't effective.     Review of Systems  Respiratory: Negative for shortness of breath.   Gastrointestinal: Positive for abdominal pain and nausea. Negative for vomiting.  Neurological: Positive for headaches.  Psychiatric/Behavioral: The patient is  nervous/anxious.        See HPI       Past Medical History:  Diagnosis Date  . Anxiety   . Complication of anesthesia   . Difficult intubation   . Headache    migraiones  . Lyme disease      Social History   Socioeconomic History  . Marital status: Married    Spouse name: Not on file  . Number of children: Not on file  . Years of education: Not on file  . Highest education level: Not on file  Occupational History  . Occupation: Event organiser: Production manager  Tobacco Use  . Smoking status: Never Smoker  . Smokeless tobacco: Never Used  Substance and Sexual Activity  . Alcohol use: No  . Drug use: No  . Sexual activity: Not on file  Other Topics Concern  . Not on file  Social History Narrative   Agricultural consultant for [employer]   Married   2 kids, 3rd child died of hypoplastic L heart 02/11/2012   Social Determinants of Health   Financial Resource Strain:   . Difficulty of Paying Living Expenses:   Food Insecurity:   . Worried About Programme researcher, broadcasting/film/video in the Last Year:   . Barista in the Last Year:   Transportation Needs:   . Freight forwarder (Medical):   Marland Kitchen Lack of Transportation (Non-Medical):   Physical Activity:   . Days of Exercise per Week:   .  Minutes of Exercise per Session:   Stress:   . Feeling of Stress :   Social Connections:   . Frequency of Communication with Friends and Family:   . Frequency of Social Gatherings with Friends and Family:   . Attends Religious Services:   . Active Member of Clubs or Organizations:   . Attends Banker Meetings:   Marland Kitchen Marital Status:   Intimate Partner Violence:   . Fear of Current or Ex-Partner:   . Emotionally Abused:   Marland Kitchen Physically Abused:   . Sexually Abused:     Past Surgical History:  Procedure Laterality Date  . CHOLECYSTECTOMY N/A 04/08/2016   Procedure: LAPAROSCOPIC CHOLECYSTECTOMY WITH INTRAOPERATIVE CHOLANGIOGRAM;  Surgeon: Ricarda Frame, MD;  Location: [facility] ORS;   Service: General;  Laterality: N/A;  . TONSILLECTOMY AND ADENOIDECTOMY  ~1991    Family History  Problem Relation Age of Onset  . Drug abuse Mother   . Hypertension Mother   . Diabetes Father     Allergies  Allergen Reactions  . Amoxicillin Hives    Has patient had a PCN reaction causing immediate rash, facial/tongue/throat swelling, SOB or lightheadedness with hypotension: Yes Has patient had a PCN reaction causing severe rash involving mucus membranes or skin necrosis: Yes Has patient had a PCN reaction that required hospitalization No Has patient had a PCN reaction occurring within the last 10 years: No If all of the above answers are "NO", then may proceed with Cephalosporin use.     No current outpatient medications on file prior to visit.   No current facility-administered medications on file prior to visit.    BP (!) 144/92   Pulse 90   Temp (!) 96.9 F (36.1 C) (Temporal)   Ht 5\' 11"  (1.803 m)   Wt 286 lb 12 oz (130.1 kg)   SpO2 98%   BMI 39.99 kg/m    Objective:   Physical Exam Cardiovascular:     Rate and Rhythm: Normal rate and regular rhythm.  Pulmonary:     Effort: Pulmonary effort is normal.     Breath sounds: Normal breath sounds.  Musculoskeletal:     Cervical back: Neck supple.  Skin:    General: Skin is warm and dry.            Assessment & Plan:

## 2020-04-15 NOTE — Assessment & Plan Note (Signed)
No recent symptoms of reflux, however, it seems that he could possibly have a stress ulcer from anxiety.  Will initiate omeprazole 20 mg, he will update.  We will also be treating anxiety.

## 2020-04-15 NOTE — Assessment & Plan Note (Signed)
Uncontrolled on as needed hydroxyzine. GAD-7 score of 16, also with abdominal symptoms and insomnia due to anxiety.  Discussed options for treatment, Lexapro did not seem very effective.  Start citalopram 20 mg.  Prescription for trazodone 50 mg sent to pharmacy to help with sleep.  Discontinue hydroxyzine.  Follow-up in 6 weeks for reevaluation.

## 2020-04-15 NOTE — Patient Instructions (Signed)
Start citalopram (Celexa) once daily for anxiety. Start with 1/2 tablet daily for 6 days, then increase to 1 full tablet thereafter.  You may take Trazodone at bedtime as needed for sleep. Start with 1/2 tablet, increase to 1 full tablet if needed.  Start omeprazole 20 mg once daily for abdominal pain.  Please schedule a follow up visit for 6 weeks for follow up of anxiety/depression.  It was a pleasure to see you today!

## 2020-04-16 NOTE — Telephone Encounter (Signed)
Gerald Drilling, do you know anything about his omeprazole prescription?

## 2020-05-05 MED ORDER — TRAZODONE HCL 50 MG PO TABS: 75.0000 mg | ORAL_TABLET | Freq: Every evening | ORAL | 0 refills | Status: DC | PRN

## 2020-05-06 ENCOUNTER — Telehealth: Payer: Self-pay | Admitting: Primary Care

## 2020-05-06 ENCOUNTER — Telehealth (INDEPENDENT_AMBULATORY_CARE_PROVIDER_SITE_OTHER): Payer: BLUE CROSS/BLUE SHIELD | Admitting: Primary Care

## 2020-05-06 DIAGNOSIS — F411 Generalized anxiety disorder: Secondary | ICD-10-CM | POA: Diagnosis not present

## 2020-05-06 DIAGNOSIS — G4700 Insomnia, unspecified: Secondary | ICD-10-CM | POA: Diagnosis not present

## 2020-05-06 MED ORDER — ONDANSETRON 4 MG PO TBDP: 4.0000 mg | ORAL_TABLET | Freq: Three times a day (TID) | ORAL | 0 refills | Status: DC | PRN

## 2020-05-06 MED ORDER — TRAZODONE HCL 100 MG PO TABS: 100.0000 mg | ORAL_TABLET | Freq: Every day | ORAL | 0 refills | Status: DC

## 2020-05-06 NOTE — Telephone Encounter (Signed)
Patient evaluated today during virtual visit.

## 2020-05-06 NOTE — Telephone Encounter (Signed)
Gerald [name], can you help?

## 2020-05-06 NOTE — Telephone Encounter (Signed)
I don't think we can fax not to work.  If you attach to my chart he can print and give it to them or email to them

## 2020-05-06 NOTE — Progress Notes (Signed)
Subjective:    Patient ID: Gerald [name], male    DOB: Nov 11, 1985, 34 y.o.   MRN: [id number]  HPI  Virtual Visit via Video Note  I connected with Gerald [name] on 05/06/20 at 11:20 AM EDT by a video enabled telemedicine application and verified that I am speaking with the correct person using two identifiers.  Location: Patient: HOME Provider: Office Participants: patient and myself   I discussed the limitations of evaluation and management by telemedicine and the availability of in person appointments. The patient expressed understanding and agreed to proceed.  History of Present Illness:  Mr. Doo is a 34 year old male with a history of migraines, GERD, GAD, obesity, hyperlipidemia who presents today with a chief complaint of diarrhea, nausea, vomiting, anxiety, insomnia.  He was last evaluated three weeks ago for symptoms of continued anxiety with insomnia. He's under a lot of stress with work, his new job isn't what he thought it would be. Given his symptoms we initiated him on citaloporam 20 mg as he felt that Lexapro was ineffective. He started his citalopram at half dose about three weeks ago, started noticing symptoms of feeling shaky and nauseated within 30 minutes of taking. He advanced to a full tablet ten days ago and symptoms increased.   He was on vacation with his family all last week, didn't sleep well, felt shaky and nauseated in the morning after taking a full tablet of citalopram. Sunday evening, the day prior to returning to work, he developed increased anxiety, nausea, dry heaved yellow phlegm, and hardly slept. Yesterday he proceeded to go to work, ate a biscuit on the way, and about one hour later vomited. He vomited twice more at work before he left for the day.   He's not eaten much over the last two days. Feels like symptoms are secondary to anxiety about work and little sleep. He is taking Trazodone which initially helped, but doesn't seem effective now. He is up to  75 mg.   He thinks he may need some medical leave from work in order to get his anxiety and sleep under control. He would like to start his leave today, but will need to discuss with HR. He is also requesting something for nausea.    Observations/Objective:  Alert and oriented. Appears well, not sickly. No distress. Speaking in complete sentences.   Assessment and Plan:  Suspect the majority of symptoms are side effects from citalopram, also uncontrolled anxiety and insomnia.  He doesn't appear sickly, his children have no GI symptoms.  Reduce citalopram to 10 mg for now, consider increasing to full tab in a few weeks. Increase Trazodone to 100 mg, with max dose of 150 mg. He will tweak. Agree to medical leave with start date of 05/06/20, return to work date of 06/04/20, he will call HR department and get this sent over.  Follow up in one month. I asked him to schedule a follow up visit.   Follow Up Instructions:  Reduce your citalopram to 1/2 tablet for anxiety for now.  I sent a new prescription for Trazodone 100 mg to help you sleep. You can take up to 1 and 1/2 tablets.   Have your HR department send me paperwork for the medical leave.  Please schedule a follow up appointment in 1 month.  It was a pleasure to see you today! Mayra Reel, NP-C    I discussed the assessment and treatment plan with the patient. The patient was provided an opportunity  to ask questions and all were answered. The patient agreed with the plan and demonstrated an understanding of the instructions.   The patient was advised to call back or seek an in-person evaluation if the symptoms worsen or if the condition fails to improve as anticipated.    Doreene Nest, NP    Review of Systems  Constitutional: Negative for chills and fever.  Gastrointestinal: Positive for nausea and vomiting. Negative for abdominal pain.  Psychiatric/Behavioral: Positive for sleep disturbance. The patient is  nervous/anxious.        Past Medical History:  Diagnosis Date  . Anxiety   . Complication of anesthesia   . Difficult intubation   . Headache    migraiones  . Lyme disease      Social History   Socioeconomic History  . Marital status: Married    Spouse name: Not on file  . Number of children: Not on file  . Years of education: Not on file  . Highest education level: Not on file  Occupational History  . Occupation: Event organiser: Production manager  Tobacco Use  . Smoking status: Never Smoker  . Smokeless tobacco: Never Used  Substance and Sexual Activity  . Alcohol use: No  . Drug use: No  . Sexual activity: Not on file  Other Topics Concern  . Not on file  Social History Narrative   Agricultural consultant for [employer]   Married   2 kids, 3rd child died of hypoplastic L heart 2012-02-09   Social Determinants of Health   Financial Resource Strain:   . Difficulty of Paying Living Expenses:   Food Insecurity:   . Worried About Programme researcher, broadcasting/film/video in the Last Year:   . Barista in the Last Year:   Transportation Needs:   . Freight forwarder (Medical):   Marland Kitchen Lack of Transportation (Non-Medical):   Physical Activity:   . Days of Exercise per Week:   . Minutes of Exercise per Session:   Stress:   . Feeling of Stress :   Social Connections:   . Frequency of Communication with Friends and Family:   . Frequency of Social Gatherings with Friends and Family:   . Attends Religious Services:   . Active Member of Clubs or Organizations:   . Attends Banker Meetings:   Marland Kitchen Marital Status:   Intimate Partner Violence:   . Fear of Current or Ex-Partner:   . Emotionally Abused:   Marland Kitchen Physically Abused:   . Sexually Abused:     Past Surgical History:  Procedure Laterality Date  . CHOLECYSTECTOMY N/A 04/08/2016   Procedure: LAPAROSCOPIC CHOLECYSTECTOMY WITH INTRAOPERATIVE CHOLANGIOGRAM;  Surgeon: Ricarda Frame, MD;  Location: [facility] ORS;  Service: General;   Laterality: N/A;  . TONSILLECTOMY AND ADENOIDECTOMY  Feb 08, 1990    Family History  Problem Relation Age of Onset  . Drug abuse Mother   . Hypertension Mother   . Diabetes Father     Allergies  Allergen Reactions  . Amoxicillin Hives    Has patient had a PCN reaction causing immediate rash, facial/tongue/throat swelling, SOB or lightheadedness with hypotension: Yes Has patient had a PCN reaction causing severe rash involving mucus membranes or skin necrosis: Yes Has patient had a PCN reaction that required hospitalization No Has patient had a PCN reaction occurring within the last 10 years: No If all of the above answers are "NO", then may proceed with Cephalosporin use.     Current  Outpatient Medications on File Prior to Visit  Medication Sig Dispense Refill  . citalopram (CELEXA) 20 MG tablet Take 1 tablet (20 mg total) by mouth daily. For anxiety. 30 tablet 1  . omeprazole (PRILOSEC) 20 MG capsule Take 1 capsule (20 mg total) by mouth daily. 90 capsule 0   No current facility-administered medications on file prior to visit.    There were no vitals taken for this visit.   Objective:   Physical Exam Constitutional:      General: He is not in acute distress.    Appearance: He is not ill-appearing.  Pulmonary:     Effort: Pulmonary effort is normal.  Neurological:     Mental Status: He is alert.  Psychiatric:        Mood and Affect: Mood normal.            Assessment & Plan:

## 2020-05-06 NOTE — Patient Instructions (Signed)
Reduce your citalopram to 1/2 tablet for anxiety for now.  I sent a new prescription for Trazodone 100 mg to help you sleep. You can take up to 1 and 1/2 tablets.   Have your HR department send me paperwork for the medical leave.  Please schedule a follow up appointment in 1 month.  It was a pleasure to see you today! Mayra Reel, NP-C

## 2020-05-06 NOTE — Telephone Encounter (Signed)
Patient called in stating he spoke with employer and they stated they are needing to have the letter to write him out of work ASAP. Please advise.

## 2020-05-06 NOTE — Telephone Encounter (Signed)
Spoken to patient and he could not get it to print from his phone. He will try it on the computer. Will leave a copy up front just in case.

## 2020-05-06 NOTE — Telephone Encounter (Signed)
Do we need to fax this or can I attach it to his my chart? Zella Ball, he will be taking FMLA or short term disability. Just an FYI.

## 2020-05-06 NOTE — Assessment & Plan Note (Signed)
Suspect the majority of symptoms are side effects from citalopram, also uncontrolled anxiety and insomnia.  He doesn't appear sickly, his children have no GI symptoms.  Reduce citalopram to 10 mg for now, consider increasing to full tab in a few weeks. Increase Trazodone to 100 mg, with max dose of 150 mg. He will tweak. Agree to medical leave with start date of 05/06/20, return to work date of 06/04/20, he will call HR department and get this sent over.  Follow up in one month. I asked him to schedule a follow up visit.

## 2020-05-06 NOTE — Telephone Encounter (Signed)
Message left for patient to return my call.  

## 2020-05-06 NOTE — Telephone Encounter (Signed)
Gerald [name], see my chart messages. Can you help?

## 2020-05-07 ENCOUNTER — Other Ambulatory Visit: Payer: Self-pay | Admitting: Primary Care

## 2020-05-07 DIAGNOSIS — F411 Generalized anxiety disorder: Secondary | ICD-10-CM

## 2020-05-08 MED ORDER — VENLAFAXINE HCL ER 37.5 MG PO CP24: 37.5000 mg | ORAL_CAPSULE | Freq: Every day | ORAL | 1 refills | Status: DC

## 2020-05-11 ENCOUNTER — Other Ambulatory Visit: Payer: Self-pay | Admitting: Primary Care

## 2020-05-11 DIAGNOSIS — K219 Gastro-esophageal reflux disease without esophagitis: Secondary | ICD-10-CM

## 2020-05-12 NOTE — Telephone Encounter (Signed)
Robin, FYI. 

## 2020-05-13 NOTE — Telephone Encounter (Signed)
Zella Ball, have you seen anything?

## 2020-05-14 NOTE — Telephone Encounter (Signed)
FMLA paperwork in [employer] in box °

## 2020-05-14 NOTE — Telephone Encounter (Signed)
Spoke with pt he will email paperwork

## 2020-05-27 ENCOUNTER — Telehealth: Payer: Self-pay | Admitting: Primary Care

## 2020-05-27 NOTE — Telephone Encounter (Signed)
[employer] faxed paperwork to be filled out [employer] in [employer] notes have already been faxed pt aware

## 2020-05-28 ENCOUNTER — Other Ambulatory Visit: Payer: Self-pay | Admitting: Primary Care

## 2020-05-28 DIAGNOSIS — G4700 Insomnia, unspecified: Secondary | ICD-10-CM

## 2020-05-28 DIAGNOSIS — F411 Generalized anxiety disorder: Secondary | ICD-10-CM

## 2020-05-28 NOTE — Telephone Encounter (Signed)
Completed and placed on Robin's desk. Johny Drilling, please verify medication list, is he taking venlafaxine? I don't believe he is. Make sure other medications are correct.

## 2020-05-29 NOTE — Telephone Encounter (Signed)
Forms and notes faxed together 8/19

## 2020-05-29 NOTE — Telephone Encounter (Signed)
Spoken to patient and he stated that he is taking venlafaxine 37.5 mg once a day. He is not taking citalopram 20 mg due making feel worse and sick.

## 2020-05-29 NOTE — Telephone Encounter (Signed)
Noted. Zella Ball, did you fax off the correct medication list? He is not taking citalopram. Med list has been updated since you saw this message.

## 2020-06-05 NOTE — Telephone Encounter (Signed)
Left message asking pt to call office Please see robin

## 2020-06-06 ENCOUNTER — Encounter: Payer: Self-pay | Admitting: Primary Care

## 2020-06-06 ENCOUNTER — Ambulatory Visit: Payer: BLUE CROSS/BLUE SHIELD | Admitting: Primary Care

## 2020-06-06 ENCOUNTER — Other Ambulatory Visit: Payer: Self-pay

## 2020-06-06 VITALS — BP 142/82 | HR 100 | Ht 71.0 in | Wt 294.0 lb

## 2020-06-06 DIAGNOSIS — F411 Generalized anxiety disorder: Secondary | ICD-10-CM

## 2020-06-06 DIAGNOSIS — G4700 Insomnia, unspecified: Secondary | ICD-10-CM | POA: Diagnosis not present

## 2020-06-06 MED ORDER — TRAZODONE HCL 100 MG PO TABS: 100.0000 mg | ORAL_TABLET | Freq: Every day | ORAL | 0 refills | Status: DC

## 2020-06-06 MED ORDER — VENLAFAXINE HCL 37.5 MG PO TABS: 37.5000 mg | ORAL_TABLET | Freq: Every day | ORAL | 1 refills | Status: DC

## 2020-06-06 NOTE — Assessment & Plan Note (Signed)
Improved and doing well on venlafaxine XR 37.5 mg and trazodone 100 mg. Also back at work, new occupation and doing well. Continue current regimen.

## 2020-06-06 NOTE — Patient Instructions (Signed)
Continue venlafaxine XR 37.5 mg daily for anxiety.  It was a pleasure to see you today!

## 2020-06-06 NOTE — Progress Notes (Signed)
Subjective:    Patient ID: Gerald [name], male    DOB: [birth year], 34 y.o.   MRN: [id number]  HPI  This visit occurred during the SARS-CoV-2 public health emergency.  Safety protocols were in place, including screening questions prior to the visit, additional usage of staff PPE, and extensive cleaning of exam room while observing appropriate contact time as indicated for disinfecting solutions.   Gerald [name] is a 34 year old male with a history of anxiety, frequent headaches, migraines, GERD who presents today for follow up of anxiety.  He was last evaluated virtually on 05/06/20 with reports of continued anxiety and insomnia, suspected this was work related as the demands of his newer job were unreasonable. He initially thought that the citalopram was causing side effects of feeling jittery/shakey so we weaned off and initiated venlafaxine. We also agreed to a temporary leave of absence from work.  Since his last visit he is doing much better. He has since parted with his prior occupation and is back in his older role at [employer]. He is compliant to the venlafaxine 37.5 mg and is doing well. He denies nausea, GI upset.   He is back to work at [employer] and thus far is doing well. He is still taking Trazodone 100 mg which is working well.   Wt Readings from Last 3 Encounters:  06/06/20 294 lb (133.4 kg)  04/15/20 286 lb 12 oz (130.1 kg)  07/24/19 284 lb 5 oz (129 kg)     Review of Systems  Respiratory: Negative for shortness of breath.   Cardiovascular: Negative for chest pain.  Gastrointestinal: Negative for abdominal pain and nausea.  Psychiatric/Behavioral: Negative for sleep disturbance. The patient is not nervous/anxious.        Past Medical History:  Diagnosis Date  . Anxiety   . Complication of anesthesia   . Difficult intubation   . Headache    migraiones  . Lyme disease      Social History   Socioeconomic History  . Marital status: Married    Spouse name: Not  on file  . Number of children: Not on file  . Years of education: Not on file  . Highest education level: Not on file  Occupational History  . Occupation: Event organiser: Production manager  Tobacco Use  . Smoking status: Never Smoker  . Smokeless tobacco: Never Used  Substance and Sexual Activity  . Alcohol use: No  . Drug use: No  . Sexual activity: Not on file  Other Topics Concern  . Not on file  Social History Narrative   Agricultural consultant for [employer]   Married   2 kids, 3rd child died of hypoplastic L heart 02-23-12   Social Determinants of Health   Financial Resource Strain:   . Difficulty of Paying Living Expenses: Not on file  Food Insecurity:   . Worried About Programme researcher, broadcasting/film/video in the Last Year: Not on file  . Ran Out of Food in the Last Year: Not on file  Transportation Needs:   . Lack of Transportation (Medical): Not on file  . Lack of Transportation (Non-Medical): Not on file  Physical Activity:   . Days of Exercise per Week: Not on file  . Minutes of Exercise per Session: Not on file  Stress:   . Feeling of Stress : Not on file  Social Connections:   . Frequency of Communication with Friends and Family: Not on file  . Frequency  of Social Gatherings with Friends and Family: Not on file  . Attends Religious Services: Not on file  . Active Member of Clubs or Organizations: Not on file  . Attends Banker Meetings: Not on file  . Marital Status: Not on file  Intimate Partner Violence:   . Fear of Current or Ex-Partner: Not on file  . Emotionally Abused: Not on file  . Physically Abused: Not on file  . Sexually Abused: Not on file    Past Surgical History:  Procedure Laterality Date  . CHOLECYSTECTOMY N/A 04/08/2016   Procedure: LAPAROSCOPIC CHOLECYSTECTOMY WITH INTRAOPERATIVE CHOLANGIOGRAM;  Surgeon: Ricarda Frame, MD;  Location: [facility] ORS;  Service: General;  Laterality: N/A;  . TONSILLECTOMY AND ADENOIDECTOMY  ~1991    Family History    Problem Relation Age of Onset  . Drug abuse Mother   . Hypertension Mother   . Diabetes Father     Allergies  Allergen Reactions  . Amoxicillin Hives    Has patient had a PCN reaction causing immediate rash, facial/tongue/throat swelling, SOB or lightheadedness with hypotension: Yes Has patient had a PCN reaction causing severe rash involving mucus membranes or skin necrosis: Yes Has patient had a PCN reaction that required hospitalization No Has patient had a PCN reaction occurring within the last 10 years: No If all of the above answers are "NO", then may proceed with Cephalosporin use.     Current Outpatient Medications on File Prior to Visit  Medication Sig Dispense Refill  . omeprazole (PRILOSEC) 20 MG capsule TAKE 1 CAPSULE BY MOUTH EVERY DAY 90 capsule 0   No current facility-administered medications on file prior to visit.    BP (!) 142/82   Pulse 100   Ht 5\' 11"  (1.803 m)   Wt 294 lb (133.4 kg)   SpO2 98%   BMI 41.00 kg/m    Objective:   Physical Exam Cardiovascular:     Rate and Rhythm: Normal rate and regular rhythm.  Pulmonary:     Effort: Pulmonary effort is normal.     Breath sounds: Normal breath sounds.  Musculoskeletal:     Cervical back: Neck supple.  Skin:    General: Skin is warm and dry.  Psychiatric:        Mood and Affect: Mood normal.            Assessment & Plan:

## 2020-07-07 DIAGNOSIS — F411 Generalized anxiety disorder: Secondary | ICD-10-CM

## 2020-07-07 MED ORDER — VENLAFAXINE HCL 37.5 MG PO TABS: 37.5000 mg | ORAL_TABLET | Freq: Every day | ORAL | 0 refills | Status: DC

## 2020-07-07 NOTE — Telephone Encounter (Signed)
Ok to send in new script?

## 2020-07-16 ENCOUNTER — Encounter: Payer: Self-pay | Admitting: Psychiatry

## 2020-07-16 ENCOUNTER — Other Ambulatory Visit: Payer: Self-pay

## 2020-07-16 ENCOUNTER — Telehealth (INDEPENDENT_AMBULATORY_CARE_PROVIDER_SITE_OTHER): Payer: Self-pay | Admitting: Psychiatry

## 2020-07-16 DIAGNOSIS — G4700 Insomnia, unspecified: Secondary | ICD-10-CM

## 2020-07-16 DIAGNOSIS — F411 Generalized anxiety disorder: Secondary | ICD-10-CM

## 2020-07-16 MED ORDER — HYDROXYZINE HCL 50 MG PO TABS: 25.0000 mg | ORAL_TABLET | Freq: Every evening | ORAL | 1 refills | Status: DC | PRN

## 2020-07-16 MED ORDER — CLONAZEPAM 0.5 MG PO TABS: 0.2500 mg | ORAL_TABLET | ORAL | 0 refills | Status: DC

## 2020-07-16 MED ORDER — VENLAFAXINE HCL ER 75 MG PO CP24: 75.0000 mg | ORAL_CAPSULE | Freq: Every day | ORAL | 1 refills | Status: DC
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
## 2020-07-16 NOTE — Patient Instructions (Signed)
Clonazepam tablets What is this medicine? CLONAZEPAM (kloe NA ze pam) is a benzodiazepine. It is used to treat certain types of seizures. It is also used to treat panic disorder. This medicine may be used for other purposes; ask your health care provider or pharmacist if you have questions. COMMON BRAND NAME(S): Ceberclon, Klonopin What should I tell my health care provider before I take this medicine? They need to know if you have any of these conditions:  an alcohol or drug abuse problem  bipolar disorder, depression, psychosis or other mental health condition  glaucoma  kidney or liver disease  lung or breathing disease  myasthenia gravis  Parkinson's disease  porphyria  seizures or a history of seizures  suicidal thoughts  an unusual or allergic reaction to clonazepam, other benzodiazepines, foods, dyes, or preservatives  pregnant or trying to get pregnant  breast-feeding How should I use this medicine? Take this medicine by mouth with a glass of water. Follow the directions on the prescription label. If it upsets your stomach, take it with food or milk. Take your medicine at regular intervals. Do not take it more often than directed. Do not stop taking or change the dose except on the advice of your doctor or health care professional. A special MedGuide will be given to you by the pharmacist with each prescription and refill. Be sure to read this information carefully each time. Talk to your pediatrician regarding the use of this medicine in children. Special care may be needed. Overdosage: If you think you have taken too much of this medicine contact a poison control center or emergency room at once. NOTE: This medicine is only for you. Do not share this medicine with others. What if I miss a dose? If you miss a dose, take it as soon as you can. If it is almost time for your next dose, take only that dose. Do not take double or extra doses. What may interact with this  medicine? Do not take this medication with any of the following medicines:  narcotic medicines for cough  sodium oxybate This medicine may also interact with the following medications:  alcohol  antihistamines for allergy, cough and cold  antiviral medicines for HIV or AIDS  certain medicines for anxiety or sleep  certain medicines for depression, like amitriptyline, fluoxetine, sertraline  certain medicines for fungal infections like ketoconazole and itraconazole  certain medicines for seizures like carbamazepine, phenobarbital, phenytoin, primidone  general anesthetics like halothane, isoflurane, methoxyflurane, propofol  local anesthetics like lidocaine, pramoxine, tetracaine  medicines that relax muscles for surgery  narcotic medicines for pain  phenothiazines like chlorpromazine, mesoridazine, prochlorperazine, thioridazine This list may not describe all possible interactions. Give your health care provider a list of all the medicines, herbs, non-prescription drugs, or dietary supplements you use. Also tell them if you smoke, drink alcohol, or use illegal drugs. Some items may interact with your medicine. What should I watch for while using this medicine? Tell your doctor or health care professional if your symptoms do not start to get better or if they get worse. Do not stop taking except on your doctor's advice. You may develop a severe reaction. Your doctor will tell you how much medicine to take. You may get drowsy or dizzy. Do not drive, use machinery, or do anything that needs mental alertness until you know how this medicine affects you. To reduce the risk of dizzy and fainting spells, do not stand or sit up quickly, especially if you are  an older patient. Alcohol may increase dizziness and drowsiness. Avoid alcoholic drinks. If you are taking another medicine that also causes drowsiness, you may have more side effects. Give your health care provider a list of all  medicines you use. Your doctor will tell you how much medicine to take. Do not take more medicine than directed. Call emergency for help if you have problems breathing or unusual sleepiness. The use of this medicine may increase the chance of suicidal thoughts or actions. Pay special attention to how you are responding while on this medicine. Any worsening of mood, or thoughts of suicide or dying should be reported to your health care professional right away. What side effects may I notice from receiving this medicine? Side effects that you should report to your doctor or health care professional as soon as possible:  allergic reactions like skin rash, itching or hives, swelling of the face, lips, or tongue  breathing problems  confusion  loss of balance or coordination  signs and symptoms of low blood pressure like dizziness; feeling faint or lightheaded, falls; unusually weak or tired  suicidal thoughts or mood changes Side effects that usually do not require medical attention (report to your doctor or health care professional if they continue or are bothersome):  dizziness  headache  tiredness  upset stomach This list may not describe all possible side effects. Call your doctor for medical advice about side effects. You may report side effects to FDA at 1-800-FDA-1088. Where should I keep my medicine? Keep out of the reach of children. This medicine can be abused. Keep your medicine in a safe place to protect it from theft. Do not share this medicine with anyone. Selling or giving away this medicine is dangerous and against the law. This medicine may cause accidental overdose and death if taken by other adults, children, or pets. Mix any unused medicine with a substance like cat litter or coffee grounds. Then throw the medicine away in a sealed container like a sealed bag or a coffee can with a lid. Do not use the medicine after the expiration date. Store at room temperature between  15 and 30 degrees C (59 and 86 degrees F). Protect from light. Keep container tightly closed. NOTE: This sheet is a summary. It may not cover all possible information. If you have questions about this medicine, talk to your doctor, pharmacist, or health care provider.  2020 Elsevier/Gold Standard (2016-03-05 18:46:32) Hydroxyzine capsules or tablets What is this medicine? HYDROXYZINE (hye DROX i zeen) is an antihistamine. This medicine is used to treat allergy symptoms. It is also used to treat anxiety and tension. This medicine can be used with other medicines to induce sleep before surgery. This medicine may be used for other purposes; ask your health care provider or pharmacist if you have questions. COMMON BRAND NAME(S): ANX, Atarax, Rezine, Vistaril What should I tell my health care provider before I take this medicine? They need to know if you have any of these conditions:  glaucoma  heart disease  history of irregular heartbeat  kidney disease  liver disease  lung or breathing disease, like asthma  stomach or intestine problems  thyroid disease  trouble passing urine  an unusual or allergic reaction to hydroxyzine, cetirizine, other medicines, foods, dyes or preservatives  pregnant or trying to get pregnant  breast-feeding How should I use this medicine? Take this medicine by mouth with a full glass of water. Follow the directions on the prescription label. You may  take this medicine with food or on an empty stomach. Take your medicine at regular intervals. Do not take your medicine more often than directed. Talk to your pediatrician regarding the use of this medicine in children. Special care may be needed. While this drug may be prescribed for children as young as 42 years of age for selected conditions, precautions do apply. Patients over 62 years old may have a stronger reaction and need a smaller dose. Overdosage: If you think you have taken too much of this medicine  contact a poison control center or emergency room at once. NOTE: This medicine is only for you. Do not share this medicine with others. What if I miss a dose? If you miss a dose, take it as soon as you can. If it is almost time for your next dose, take only that dose. Do not take double or extra doses. What may interact with this medicine? Do not take this medicine with any of the following medications:  cisapride  dronedarone  pimozide  thioridazine This medicine may also interact with the following medications:  alcohol  antihistamines for allergy, cough, and cold  atropine  barbiturate medicines for sleep or seizures, like phenobarbital  certain antibiotics like erythromycin or clarithromycin  certain medicines for anxiety or sleep  certain medicines for bladder problems like oxybutynin, tolterodine  certain medicines for depression or psychotic disturbances  certain medicines for irregular heart beat  certain medicines for Parkinson's disease like benztropine, trihexyphenidyl  certain medicines for seizures like phenobarbital, primidone  certain medicines for stomach problems like dicyclomine, hyoscyamine  certain medicines for travel sickness like scopolamine  ipratropium  narcotic medicines for pain  other medicines that prolong the QT interval (an abnormal heart rhythm) like dofetilide This list may not describe all possible interactions. Give your health care provider a list of all the medicines, herbs, non-prescription drugs, or dietary supplements you use. Also tell them if you smoke, drink alcohol, or use illegal drugs. Some items may interact with your medicine. What should I watch for while using this medicine? Tell your doctor or health care professional if your symptoms do not improve. You may get drowsy or dizzy. Do not drive, use machinery, or do anything that needs mental alertness until you know how this medicine affects you. Do not stand or sit up  quickly, especially if you are an older patient. This reduces the risk of dizzy or fainting spells. Alcohol may interfere with the effect of this medicine. Avoid alcoholic drinks. Your mouth may get dry. Chewing sugarless gum or sucking hard candy, and drinking plenty of water may help. Contact your doctor if the problem does not go away or is severe. This medicine may cause dry eyes and blurred vision. If you wear contact lenses you may feel some discomfort. Lubricating drops may help. See your eye doctor if the problem does not go away or is severe. If you are receiving skin tests for allergies, tell your doctor you are using this medicine. What side effects may I notice from receiving this medicine? Side effects that you should report to your doctor or health care professional as soon as possible:  allergic reactions like skin rash, itching or hives, swelling of the face, lips, or tongue  changes in vision  confusion  fast, irregular heartbeat  seizures  tremor  trouble passing urine or change in the amount of urine Side effects that usually do not require medical attention (report to your doctor or health care professional  if they continue or are bothersome):  constipation  drowsiness  dry mouth  headache  tiredness This list may not describe all possible side effects. Call your doctor for medical advice about side effects. You may report side effects to FDA at 1-800-FDA-1088. Where should I keep my medicine? Keep out of the reach of children. Store at room temperature between 15 and 30 degrees C (59 and 86 degrees F). Keep container tightly closed. Throw away any unused medicine after the expiration date. NOTE: This sheet is a summary. It may not cover all possible information. If you have questions about this medicine, talk to your doctor, pharmacist, or health care provider.  2020 Elsevier/Gold Standard (2018-09-18 13:19:55)

## 2020-07-24 ENCOUNTER — Other Ambulatory Visit: Payer: Self-pay

## 2020-07-24 ENCOUNTER — Ambulatory Visit (INDEPENDENT_AMBULATORY_CARE_PROVIDER_SITE_OTHER): Payer: Self-pay | Admitting: Licensed Clinical Social Worker

## 2020-07-24 ENCOUNTER — Encounter: Payer: Self-pay | Admitting: Licensed Clinical Social Worker

## 2020-07-24 DIAGNOSIS — F411 Generalized anxiety disorder: Secondary | ICD-10-CM
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
## 2020-07-28 ENCOUNTER — Other Ambulatory Visit: Payer: Self-pay | Admitting: Primary Care

## 2020-07-28 DIAGNOSIS — G4700 Insomnia, unspecified: Secondary | ICD-10-CM

## 2020-07-29 ENCOUNTER — Other Ambulatory Visit: Payer: Self-pay | Admitting: Primary Care

## 2020-07-29 DIAGNOSIS — F411 Generalized anxiety disorder: Secondary | ICD-10-CM

## 2020-08-05 ENCOUNTER — Ambulatory Visit: Payer: Self-pay | Admitting: Licensed Clinical Social Worker

## 2020-08-07 ENCOUNTER — Other Ambulatory Visit: Payer: Self-pay | Admitting: Psychiatry

## 2020-08-07 DIAGNOSIS — F411 Generalized anxiety disorder: Secondary | ICD-10-CM

## 2020-08-07 DIAGNOSIS — G4700 Insomnia, unspecified: Secondary | ICD-10-CM

## 2020-08-12 ENCOUNTER — Ambulatory Visit (INDEPENDENT_AMBULATORY_CARE_PROVIDER_SITE_OTHER): Payer: Self-pay | Admitting: Licensed Clinical Social Worker

## 2020-08-12 ENCOUNTER — Other Ambulatory Visit: Payer: Self-pay

## 2020-08-12 ENCOUNTER — Encounter: Payer: Self-pay | Admitting: Licensed Clinical Social Worker

## 2020-08-12 DIAGNOSIS — F411 Generalized anxiety disorder: Secondary | ICD-10-CM

## 2020-08-12 NOTE — Progress Notes (Signed)
Virtual Visit via Video Note  I connected with Gerald [name] on 08/12/20 at  9:00 AM EDT by a video enabled telemedicine application and verified that I am speaking with the correct person using two identifiers.  Location: Patient: Home Provider: Home Office   I discussed the limitations of evaluation and management by telemedicine and the availability of in person appointments. The patient expressed understanding and agreed to proceed. THERAPY PROGRESS NOTE  Session Time: 51 Minutes  Participation Level: Active  Behavioral Response: Well GroomedAlertAnxious  Type of Therapy: Individual Therapy  Treatment Goals addressed: Anxiety and Coping  Interventions: CBT  Summary: [name] is a 34 y.o. male who presents with anxiety sxs. Pt reported recent death in the family and is "processing it better than I thought I would". Pt reported that a lot of his anxiety has to do with things that cannot be fixed or controlled by him. Pt also identified having perfectionistic tendencies with high expectations on self and others and is disappointed if those expectations are not met. Pt was able to tie this back to early experiences in childhood related to attachment issues with parents and the need to overcompensate. Pt examined his thought process and concluded that some of his thoughts are not helpful and would like to find ways to resolve this.   Suicidal/Homicidal: No  Therapist Response: Therapist met with patient for first session since completing CCA. Therapist and patient reviewed treatment plan and goals. Pt in agreement. Therapist and patient explored reactions to stressors and cognitive distortions. Therapist assisted patient in examining own thought process to broaden awareness. Pt was receptive.  Plan: Return again in 2 weeks.  Diagnosis: Axis I: Generalized Anxiety Disorder    Axis II: N/A  [name], LCSW, LCAS 08/12/2020

## 2020-08-15 ENCOUNTER — Other Ambulatory Visit: Payer: Self-pay

## 2020-08-15 ENCOUNTER — Encounter: Payer: Self-pay | Admitting: Psychiatry

## 2020-08-15 ENCOUNTER — Telehealth (INDEPENDENT_AMBULATORY_CARE_PROVIDER_SITE_OTHER): Payer: Self-pay | Admitting: Psychiatry

## 2020-08-15 DIAGNOSIS — F411 Generalized anxiety disorder: Secondary | ICD-10-CM

## 2020-08-15 DIAGNOSIS — G4700 Insomnia, unspecified: Secondary | ICD-10-CM

## 2020-08-15 NOTE — Progress Notes (Signed)
Virtual Visit via Video Note  I connected with Gerald [name] on 08/15/20 at  9:40 AM EDT by a video enabled telemedicine application and verified that I am speaking with the correct person using two identifiers.  Location Provider Location : [facility] Patient Location : Home  Participants: Patient , Provider    I discussed the limitations of evaluation and management by telemedicine and the availability of in person appointments. The patient expressed understanding and agreed to proceed.    I discussed the assessment and treatment plan with the patient. The patient was provided an opportunity to ask questions and all were answered. The patient agreed with the plan and demonstrated an understanding of the instructions.   The patient was advised to call back or seek an in-person evaluation if the symptoms worsen or if the condition fails to improve as anticipated.  BH MD OP Progress Note  08/15/2020 1:04 PM Gerald [name]  MRN:  [id number]  Chief Complaint:  Chief Complaint    Follow-up     HPI: Gerald [name] is a 34 year old Caucasian male, married, employed, lives in [place], has a history of GAD, insomnia, possible remote history of Lyme's disease, was evaluated by telemedicine today.  Patient today reports that a lot happened in the month of October after his last visit with Clinical research associate.  He reports his mother-in-law fell, was with hospice and later on passed away.  He hence was busy taking care of her and later on with funeral arrangements and everything.  Patient however reports he feels the venlafaxine higher dosage is beneficial.  He feels calmer and does not have any significant anxiety symptoms.  The first week that he tried the higher dosage he felt more on edge however that subsided.  Patient reports he did have some stress when he felt overwhelmed however he was able to cope with it.  He has not used his clonazepam  since his last visit with Clinical research associate.  He has been using hydroxyzine  half tablet at night with the melatonin.  That does help with his sleep.  He does get up a couple of times at night however is able to fall back asleep and feels rested when he wakes up in the morning.  The higher dose of hydroxyzine however made him groggy.  Patient denies any suicidality, homicidality or perceptual disturbances.  He reports therapy sessions as beneficial.  Patient denies any other concerns today.  Visit Diagnosis:    ICD-10-CM   1. GAD (generalized anxiety disorder)  F41.1   2. Insomnia, unspecified type  G47.00     Past Psychiatric History: I have reviewed past psychiatric history from my progress note on 07/16/2020.  Past trials of Celexa, Lexapro, hydroxyzine, trazodone  Past Medical History:  Past Medical History:  Diagnosis Date  . Anxiety   . Complication of anesthesia   . Difficult intubation   . Headache    migraiones  . Lyme disease     Past Surgical History:  Procedure Laterality Date  . CHOLECYSTECTOMY N/A 04/08/2016   Procedure: LAPAROSCOPIC CHOLECYSTECTOMY WITH INTRAOPERATIVE CHOLANGIOGRAM;  Surgeon: Ricarda Frame, MD;  Location: [facility] ORS;  Service: General;  Laterality: N/A;  . TONSILLECTOMY AND ADENOIDECTOMY  ~1991    Family Psychiatric History: Reviewed family psychiatric history from my progress note on 07/16/2020  Family History:  Family History  Problem Relation Age of Onset  . Drug abuse Mother   . Hypertension Mother   . Mental illness Mother   . Diabetes Father   .  Bipolar disorder Sister   . Depression Brother     Social History: Reviewed social history from my progress note on 07/16/2020 Social History   Socioeconomic History  . Marital status: Married    Spouse name: Not on file  . Number of children: Not on file  . Years of education: Not on file  . Highest education level: Not on file  Occupational History  . Occupation: Event organiser: Production manager  Tobacco Use  . Smoking status: Never Smoker  . Smokeless  tobacco: Never Used  Substance and Sexual Activity  . Alcohol use: Yes    Comment: OCCASIONAL  . Drug use: No  . Sexual activity: Not on file  Other Topics Concern  . Not on file  Social History Narrative   Agricultural consultant for [employer]   Married   2 kids, 3rd child died of hypoplastic L heart 02-08-2012   Social Determinants of Health   Financial Resource Strain:   . Difficulty of Paying Living Expenses: Not on file  Food Insecurity:   . Worried About Programme researcher, broadcasting/film/video in the Last Year: Not on file  . Ran Out of Food in the Last Year: Not on file  Transportation Needs:   . Lack of Transportation (Medical): Not on file  . Lack of Transportation (Non-Medical): Not on file  Physical Activity:   . Days of Exercise per Week: Not on file  . Minutes of Exercise per Session: Not on file  Stress:   . Feeling of Stress : Not on file  Social Connections:   . Frequency of Communication with Friends and Family: Not on file  . Frequency of Social Gatherings with Friends and Family: Not on file  . Attends Religious Services: Not on file  . Active Member of Clubs or Organizations: Not on file  . Attends Banker Meetings: Not on file  . Marital Status: Not on file    Allergies:  Allergies  Allergen Reactions  . Amoxicillin Hives    Has patient had a PCN reaction causing immediate rash, facial/tongue/throat swelling, SOB or lightheadedness with hypotension: Yes Has patient had a PCN reaction causing severe rash involving mucus membranes or skin necrosis: Yes Has patient had a PCN reaction that required hospitalization No Has patient had a PCN reaction occurring within the last 10 years: No If all of the above answers are "NO", then may proceed with Cephalosporin use.     Metabolic Disorder Labs: Lab Results  Component Value Date   HGBA1C 5.6 02/21/2019   No results found for: PROLACTIN Lab Results  Component Value Date   CHOL 226 (H) 02/21/2019   TRIG (H)  02/21/2019    517.0 Triglyceride is over 400; calculations on Lipids are invalid.   HDL 28.70 (L) 02/21/2019   CHOLHDL 8 02/21/2019   Lab Results  Component Value Date   TSH 1.85 02/21/2019   TSH 0.64 11/08/2011    Therapeutic Level Labs: No results found for: LITHIUM No results found for: VALPROATE No components found for:  CBMZ  Current Medications: Current Outpatient Medications  Medication Sig Dispense Refill  . clonazePAM (KLONOPIN) 0.5 MG tablet Take 0.5-1 tablets (0.25-0.5 mg total) by mouth as directed. Take half to one tablet 2-3 times a week only for severe panic/anxiety attacks 15 tablet 0  . hydrOXYzine (ATARAX/VISTARIL) 50 MG tablet TAKE 0.5-1 TABLETS (25-50 MG TOTAL) BY MOUTH AT BEDTIME AS NEEDED. FOR SLEEP 90 tablet 2  . Melatonin 10  MG TABS Take 10 mg by mouth.    . naproxen sodium (ALEVE) 220 MG tablet Take 220 mg by mouth daily as needed.    Marland Kitchen. omeprazole (PRILOSEC) 20 MG capsule TAKE 1 CAPSULE BY MOUTH EVERY DAY 90 capsule 0  . traZODone (DESYREL) 100 MG tablet Take 1 tablet (100 mg total) by mouth at bedtime. For sleep. 90 tablet 0  . venlafaxine XR (EFFEXOR-XR) 75 MG 24 hr capsule TAKE 1 CAPSULE BY MOUTH DAILY WITH BREAKFAST. 90 capsule 1   No current facility-administered medications for this visit.     Musculoskeletal: Strength & Muscle Tone: UTA Gait & Station: UTA Patient leans: N/A  Psychiatric Specialty Exam: Review of Systems  Psychiatric/Behavioral: Positive for sleep disturbance (improving). Negative for agitation, behavioral problems, confusion, decreased concentration, dysphoric mood, hallucinations, self-injury and suicidal ideas. The patient is not nervous/anxious and is not hyperactive.   All other systems reviewed and are negative.   There were no vitals taken for this visit.There is no height or weight on file to calculate BMI.  General Appearance: Casual  Eye Contact:  Fair  Speech:  Clear and Coherent  Volume:  Normal  Mood:   Euthymic  Affect:  Congruent  Thought Process:  Goal Directed and Descriptions of Associations: Intact  Orientation:  Full (Time, Place, and Person)  Thought Content: Logical   Suicidal Thoughts:  No  Homicidal Thoughts:  No  Memory:  Immediate;   Fair Recent;   Fair Remote;   Fair  Judgement:  Fair  Insight:  Fair  Psychomotor Activity:  Normal  Concentration:  Concentration: Fair and Attention Span: Fair  Recall:  FiservFair  Fund of Knowledge: Fair  Language: Fair  Akathisia:  No  Handed:  Left  AIMS (if indicated): UTA  Assets:  Communication Skills Desire for Improvement Housing Intimacy Resilience Social Support Talents/Skills Transportation Vocational/Educational  ADL's:  Intact  Cognition: WNL  Sleep:  Improving   Screenings: GAD-7     [facility]  Total GAD-7 Score 16    PHQ2-9     [facility]  PHQ-2 Total Score 0 0 3  PHQ-9 Total Score -- -- 16       Assessment and Plan: Gerald Nordmannicholas Massie is a 34 year old Caucasian male, employed, married, lives in [place], has a history of GAD, insomnia was evaluated by telemedicine today.  Patient is biologically predisposed given his family history, history of trauma.  Patient with psychosocial stressors of job related stressors.  Patient is currently improving.  Patient will continue to benefit from medication readjustment and psychotherapy sessions.  Plan as noted below.  Plan GAD-improving Effexor extended release 75 mg p.o. daily with breakfast Continue Klonopin 0.25 to 0.5 mg 2-3 times a week only for severe panic attacks. I have reviewed [place] controlled substance database. Patient was referred for CBT, will advised to continue psychotherapy sessions.  Insomnia-improving Melatonin as  needed Hydroxyzine 25 to 50 mg p.o. nightly as needed  Pending labs-TSH-she will get it from his primary care provider.  Follow-up in clinic in 3 to 4 weeks or sooner if needed.  I have spent atleast 20 minutes face to face by video with patient today. More than 50 % of the time was spent for preparing to see the patient ( e.g., review of test,  records ), ordering medications and test ,psychoeducation and supportive psychotherapy and care coordination,as well as documenting clinical information in electronic health record. This note was generated in part or whole with voice recognition software. Voice recognition is usually quite accurate but there are transcription errors that can and very often do occur. I apologize for any typographical errors that were not detected and corrected.        Jomarie Longs, MD 08/15/2020, 1:04 PM

## 2020-08-25 ENCOUNTER — Ambulatory Visit: Payer: Self-pay | Admitting: Licensed Clinical Social Worker

## 2020-08-25 ENCOUNTER — Other Ambulatory Visit: Payer: Self-pay

## 2020-09-02 ENCOUNTER — Other Ambulatory Visit: Payer: Self-pay

## 2020-09-02 ENCOUNTER — Ambulatory Visit (INDEPENDENT_AMBULATORY_CARE_PROVIDER_SITE_OTHER): Payer: Self-pay | Admitting: Licensed Clinical Social Worker

## 2020-09-02 ENCOUNTER — Encounter: Payer: Self-pay | Admitting: Licensed Clinical Social Worker

## 2020-09-02 DIAGNOSIS — F411 Generalized anxiety disorder: Secondary | ICD-10-CM

## 2020-09-02 NOTE — Progress Notes (Signed)
Virtual Visit via Telephone Note  I connected with Gerald [name] on 09/02/20 at  2:00 PM EST by telephone and verified that I am speaking with the correct person using two identifiers.  Participating Parties: Patient Provider  Location: Patient: Worksite Provider: Home Office   I attempted to connect with patient via video invite links, however due to connection issues session was moved to over the phone. I discussed the limitations, risks, security and privacy concerns of performing an evaluation and management service by telephone and the availability of in person appointments. I also discussed with the patient that there may be a patient responsible charge related to this service. The patient expressed understanding and agreed to proceed.  THERAPY PROGRESS NOTE  Session Time: 60 Minutes   Participation Level: Active  Behavioral Response: AlertAnxious  Type of Therapy: Individual Therapy  Treatment Goals addressed: Anxiety, Communication: Boundaries and Coping  Interventions: CBT  Summary: Gerald [name] is a 34 y.o. male who presents with anxiety sxs. Pt described an argument with family members after setting boundaries and was second guessing how he reacted to and handled the situation. Pt reported "I hold a very high standard of myself and others. I often find that this alienates me". Pt reported acknowledging and monitoring his own cognitive distortions that were discussed last session, particularly maximizing the negative and minimizing/disqualifying the positive.  Suicidal/Homicidal: No  Therapist Response: Therapist met with patient for follow up. Therapist and patient explored family dynamics, conflict and communicating boundaries. Therapist validated patient concerns/feelings and normalized reactions to setting new boundaries with family.  Plan: Return again in 2 weeks.  Diagnosis: Axis I: Generalized Anxiety Disorder    Axis II: N/A  [name], LCSW,  LCAS 09/02/2020

## 2020-09-18 ENCOUNTER — Ambulatory Visit (INDEPENDENT_AMBULATORY_CARE_PROVIDER_SITE_OTHER): Payer: Self-pay | Admitting: Licensed Clinical Social Worker

## 2020-09-18 ENCOUNTER — Encounter: Payer: Self-pay | Admitting: Licensed Clinical Social Worker

## 2020-09-18 ENCOUNTER — Other Ambulatory Visit: Payer: Self-pay

## 2020-09-18 DIAGNOSIS — F411 Generalized anxiety disorder: Secondary | ICD-10-CM

## 2020-09-18 NOTE — Progress Notes (Signed)
Virtual Visit via Video Note  I connected with Gerald [name] on 09/18/20 at 10:00 AM EST by a video enabled telemedicine application and verified that I am speaking with the correct person using two identifiers.  Participating Parties Patient Provider  Location: Patient: Worksite Provider: Home Office   I discussed the limitations of evaluation and management by telemedicine and the availability of in person appointments. The patient expressed understanding and agreed to proceed.  THERAPY PROGRESS NOTE  Session Time: 35 Minutes  Participation Level: Active  Behavioral Response: Neat and Well GroomedAlertAnxious  Type of Therapy: Individual Therapy  Treatment Goals addressed: Anger, Anxiety and Coping  Interventions: CBT and DBT  Summary: [name] is a 34 y.o. male who presents with anxiety sxs. Pt reported another negative encounter with family members and decision to no longer engage with them. Pt reported he believes he made the right decision, however also struggling with the loss his extended family. Pt acknowledged he often perceives work associates and friends as members of the family and takes it Charity fundraiser when employees give notice of resignation. Pt reported he often will lose sleep and feel sick to the "pit of my stomach". Pt reported he wants to work on his anxiety and make decisions rationally rather than react emotionally.  Suicidal/Homicidal: No  Therapist Response: Therapist met with patient for follow up. Therapist and patient processed thoughts, emotions and their ties to patient reactions to current stressors. Therapist and patient explored reason mind vs emotion mind as well as finding balance between acceptance and change to help patient navigate his predicament.  Plan: Return again in 3 weeks.  Diagnosis: Axis I: Generalized Anxiety Disorder    Axis II: N/A  [name], LCSW, LCAS 09/18/2020

## 2020-09-26 ENCOUNTER — Telehealth (INDEPENDENT_AMBULATORY_CARE_PROVIDER_SITE_OTHER): Payer: Self-pay | Admitting: Psychiatry

## 2020-09-26 ENCOUNTER — Other Ambulatory Visit: Payer: Self-pay

## 2020-09-26 DIAGNOSIS — Z5329 Procedure and treatment not carried out because of patient's decision for other reasons: Secondary | ICD-10-CM

## 2020-09-26 NOTE — Progress Notes (Signed)
No response to call or text or video invite  

## 2020-09-28 ENCOUNTER — Other Ambulatory Visit: Payer: Self-pay | Admitting: Internal Medicine

## 2020-09-28 DIAGNOSIS — F411 Generalized anxiety disorder: Secondary | ICD-10-CM

## 2020-10-08 ENCOUNTER — Other Ambulatory Visit: Payer: Self-pay

## 2020-10-08 ENCOUNTER — Encounter: Payer: Self-pay | Admitting: Licensed Clinical Social Worker

## 2020-10-08 ENCOUNTER — Ambulatory Visit (INDEPENDENT_AMBULATORY_CARE_PROVIDER_SITE_OTHER): Payer: Self-pay | Admitting: Licensed Clinical Social Worker

## 2020-10-08 DIAGNOSIS — F411 Generalized anxiety disorder: Secondary | ICD-10-CM

## 2020-10-08 NOTE — Progress Notes (Signed)
Virtual Visit via Video Note  I connected with [name] on 10/08/20 at  9:00 AM EST by a video enabled telemedicine application and verified that I am speaking with the correct person using two identifiers.  Participating Parties Patient Provider  Location: Patient: Home Provider: Home Office   I discussed the limitations of evaluation and management by telemedicine and the availability of in person appointments. The patient expressed understanding and agreed to proceed.  THERAPY PROGRESS NOTE  Session Time: 36 Minutes  Participation Level: Active  Behavioral Response: AlertAnxious  Type of Therapy: Individual Therapy  Treatment Goals addressed: Anxiety and Coping  Interventions: CBT  Summary: Gerald [name] is a 34 y.o. male who presents with anxiety sxs. Pt reported he is experiencing a headache and lying-in bed today with camera off throughout session. Pt reported Christmas was "really good, the kids loved it. It was nice to take time to decompress and rest over the weekend". Pt reflected on his decision to cut ties with brother and sister as first step towards no longer "feeling obligated" to be the family "fixer". Pt identified role models and drives when he was younger and a change in priorities over the years.   Suicidal/Homicidal: No  Therapist Response:  Therapist met with patient for follow up. Therapist and patient explored family roles, burnout versus growth, and ways of coping that are no longer beneficial. Therapist provided psychoeducation around roles commonly assumed within addiction/codependent family dynamics. Therapist and patient discussed how to learn from the past and implement more effective ways to cope with present day stressors. Pt was receptive.  Plan: Return again in 2 weeks.  Diagnosis: Axis I: Generalized Anxiety Disorder    Axis II: N/A  [name], LCSW, LCAS 10/08/2020

## 2020-10-17 ENCOUNTER — Telehealth (INDEPENDENT_AMBULATORY_CARE_PROVIDER_SITE_OTHER): Payer: BC Managed Care – PPO | Admitting: Internal Medicine

## 2020-10-17 ENCOUNTER — Encounter: Payer: Self-pay | Admitting: Internal Medicine

## 2020-10-17 ENCOUNTER — Other Ambulatory Visit: Payer: Self-pay

## 2020-10-17 DIAGNOSIS — R059 Cough, unspecified: Secondary | ICD-10-CM | POA: Diagnosis not present

## 2020-10-17 MED ORDER — AZITHROMYCIN 250 MG PO TABS: ORAL_TABLET | ORAL | 0 refills | Status: DC

## 2020-10-17 NOTE — Assessment & Plan Note (Signed)
Suspect due to viral covid-19 versus flu. He is past the 5 days quarantine window and is vaccinated. Rx azithromycin to start taking Sunday if symptoms are not improving given history of walking pneumonia.

## 2020-10-17 NOTE — Progress Notes (Signed)
Virtual Visit via Video Note  I connected with Gerald [name] on 10/17/20 at  9:00 AM EST by a video enabled telemedicine application and verified that I am speaking with the correct person using two identifiers.  The patient and the provider were at separate locations throughout the entire encounter. Patient location: home, Provider location: work   I discussed the limitations of evaluation and management by telemedicine and the availability of in person appointments. The patient expressed understanding and agreed to proceed. The patient and the provider were the only parties present for the visit unless noted in HPI below.  History of Present Illness: The patient is a 35 y.o. man with visit for cough and chills and loss of voice. Did home covid-19 test rapid times 3 which were negative. Started Sunday. Had covid-19 in October and this felt similar. Has some coughing still and feels like congestion in the throat and sinuses. Denies SOB. Overall it is not improving significantly. Has tried otc.  Observations/Objective: Appearance: normal, breathing appear normal minimal coughing during visit, casual grooming, abdomen does not appear distended, throat not well visualized, mental status is A and O times 3  Assessment and Plan: See problem oriented charting  Follow Up Instructions: rx azithromycin to start taking Sunday if not improving or if worsening. 5 day quarantine period has ended if this is false negative for covid-19 and needs to be masked when out for next 5 days.   I discussed the assessment and treatment plan with the patient. The patient was provided an opportunity to ask questions and all were answered. The patient agreed with the plan and demonstrated an understanding of the instructions.   The patient was advised to call back or seek an in-person evaluation if the symptoms worsen or if the condition fails to improve as anticipated.  Myrlene Broker, MD

## 2020-10-21 ENCOUNTER — Telehealth (INDEPENDENT_AMBULATORY_CARE_PROVIDER_SITE_OTHER): Payer: BC Managed Care – PPO | Admitting: Internal Medicine

## 2020-10-21 ENCOUNTER — Other Ambulatory Visit (INDEPENDENT_AMBULATORY_CARE_PROVIDER_SITE_OTHER): Payer: BC Managed Care – PPO

## 2020-10-21 ENCOUNTER — Encounter: Payer: Self-pay | Admitting: Internal Medicine

## 2020-10-21 ENCOUNTER — Other Ambulatory Visit: Payer: Self-pay | Admitting: Internal Medicine

## 2020-10-21 ENCOUNTER — Other Ambulatory Visit: Payer: Self-pay

## 2020-10-21 DIAGNOSIS — R059 Cough, unspecified: Secondary | ICD-10-CM

## 2020-10-21 DIAGNOSIS — J029 Acute pharyngitis, unspecified: Secondary | ICD-10-CM | POA: Diagnosis not present

## 2020-10-21 DIAGNOSIS — R519 Headache, unspecified: Secondary | ICD-10-CM

## 2020-10-21 DIAGNOSIS — Z03818 Encounter for observation for suspected exposure to other biological agents ruled out: Secondary | ICD-10-CM | POA: Diagnosis not present

## 2020-10-21 DIAGNOSIS — J3489 Other specified disorders of nose and nasal sinuses: Secondary | ICD-10-CM | POA: Diagnosis not present

## 2020-10-21 DIAGNOSIS — Z20822 Contact with and (suspected) exposure to covid-19: Secondary | ICD-10-CM

## 2020-10-21 DIAGNOSIS — H938X3 Other specified disorders of ear, bilateral: Secondary | ICD-10-CM

## 2020-10-21 LAB — POC INFLUENZA A&B (BINAX/QUICKVUE)
Influenza A, POC: NEGATIVE
Influenza B, POC: NEGATIVE

## 2020-10-21 MED ORDER — PREDNISONE 10 MG PO TABS: ORAL_TABLET | ORAL | 0 refills | Status: DC

## 2020-10-21 NOTE — Patient Instructions (Signed)

## 2020-10-21 NOTE — Progress Notes (Signed)
Virtual Visit via Video Note  I connected with Gerald [name] on 10/21/20 at  9:00 AM EST by a video enabled telemedicine application and verified that I am speaking with the correct person using two identifiers.  Location: Patient: Home Provider: Office  Person's participating in this video call: Nicki Reaper NP-C and Gerald [name].   I discussed the limitations of evaluation and management by telemedicine and the availability of in person appointments. The patient expressed understanding and agreed to proceed.  History of Present Illness:  Pt reports headache, runny nose, nasal congestion, ear fullness, sore throat and cough. This started 1.5 weeks ago. The headache is located behind his eyes. He describes the pain as pressure. He denies dizziness or visual changes. He is not blowing anything out of his nose. He descrbes the ear pain as fullness, with drainage and muffled hearing. He denies difficulty swallowing but does report some post nasal drip. The cough is mostly dry and non productive. He denies loss of taste/smell, SOB, N/V/D. He denies fever, chills or body aches. He has tried Mucinex, Theraflu and Tylenol with minimal relief of symptoms. He was seen for a virtual visit for the same 1/7, prescribed Azithromycin but does not feel like he is getting better despite the antibiotics. He was exposed to Covid 1 week ago, has had 3 negative rapid Covid in the last 1.5 weeks. He has had 2 of his Covid vaccines but not his booster. He reports having Covid 07/2020.    Past Medical History:  Diagnosis Date  . Anxiety   . Complication of anesthesia   . Difficult intubation   . Headache    migraiones  . Lyme disease     Current Outpatient Medications  Medication Sig Dispense Refill  . azithromycin (ZITHROMAX) 250 MG tablet Day 1 take 2 pills, days 2-5 take 1 pill daily. 6 tablet 0  . clonazePAM (KLONOPIN) 0.5 MG tablet Take 0.5-1 tablets (0.25-0.5 mg total) by mouth as directed. Take half to  one tablet 2-3 times a week only for severe panic/anxiety attacks 15 tablet 0  . hydrOXYzine (ATARAX/VISTARIL) 50 MG tablet TAKE 0.5-1 TABLETS (25-50 MG TOTAL) BY MOUTH AT BEDTIME AS NEEDED. FOR SLEEP 90 tablet 2  . Melatonin 10 MG TABS Take 10 mg by mouth.    . naproxen sodium (ALEVE) 220 MG tablet Take 220 mg by mouth daily as needed.    Marland Kitchen omeprazole (PRILOSEC) 20 MG capsule TAKE 1 CAPSULE BY MOUTH EVERY DAY 90 capsule 0  . predniSONE (DELTASONE) 10 MG tablet Take 3 tabs on days 1-2, take 2 tabs on days 3-4, take 1 tab on days 5-6 12 tablet 0  . traZODone (DESYREL) 100 MG tablet Take 1 tablet (100 mg total) by mouth at bedtime. For sleep. 90 tablet 0  . venlafaxine XR (EFFEXOR-XR) 75 MG 24 hr capsule TAKE 1 CAPSULE BY MOUTH DAILY WITH BREAKFAST. 90 capsule 1   No current facility-administered medications for this visit.    Allergies  Allergen Reactions  . Amoxicillin Hives    Has patient had a PCN reaction causing immediate rash, facial/tongue/throat swelling, SOB or lightheadedness with hypotension: Yes Has patient had a PCN reaction causing severe rash involving mucus membranes or skin necrosis: Yes Has patient had a PCN reaction that required hospitalization No Has patient had a PCN reaction occurring within the last 10 years: No If all of the above answers are "NO", then may proceed with Cephalosporin use.     Family History  Problem  Relation Age of Onset  . Drug abuse Mother   . Hypertension Mother   . Mental illness Mother   . Diabetes Father   . Bipolar disorder Sister   . Depression Brother     Social History   Socioeconomic History  . Marital status: Married    Spouse name: Not on file  . Number of children: Not on file  . Years of education: Not on file  . Highest education level: Not on file  Occupational History  . Occupation: Event organiser: Production manager  Tobacco Use  . Smoking status: Never Smoker  . Smokeless tobacco: Never Used  Substance and  Sexual Activity  . Alcohol use: Yes    Comment: OCCASIONAL  . Drug use: No  . Sexual activity: Not on file  Other Topics Concern  . Not on file  Social History Narrative   Agricultural consultant for [employer]   Married   2 kids, 3rd child died of hypoplastic L heart 02/04/12   Social Determinants of Health   Financial Resource Strain: Not on file  Food Insecurity: Not on file  Transportation Needs: Not on file  Physical Activity: Not on file  Stress: Not on file  Social Connections: Not on file  Intimate Partner Violence: Not on file     Constitutional: Pt reports headache. Denies fever, malaise, fatigue, or abrupt weight changes.  HEENT: Pt reports runny nose, nasal congestion, ear fullness, sore throat. Denies eye pain, eye redness, ringing in the ears, wax buildup, bloody nose. Respiratory: Pt reports cough. Denies difficulty breathing, shortness of breath, or sputum production.   Cardiovascular: Denies chest pain, chest tightness, palpitations or swelling in the hands or feet.  Gastrointestinal: Denies abdominal pain, bloating, constipation, diarrhea or blood in the stool.   No other specific complaints in a complete review of systems (except as listed in HPI above).  Observations/Objective:   Wt Readings from Last 3 Encounters:  06/06/20 294 lb (133.4 kg)  04/15/20 286 lb 12 oz (130.1 kg)  07/24/19 284 lb 5 oz (129 kg)    General: Appears his stated age, obese, in NAD. HEENT: Head: normal shape and size; Nose: no congestion noted; Throat/Mouth: No hoarseness noted.  Pulmonary/Chest: Normal effort. No respiratory distress.  Neurological: Alert and oriented.    BMET    Component Value Date/Time   NA 141 07/24/2019 1620   K 4.0 07/24/2019 1620   CL 105 07/24/2019 1620   CO2 26 07/24/2019 1620   GLUCOSE 129 (H) 07/24/2019 1620   BUN 13 07/24/2019 1620   CREATININE 0.97 07/24/2019 1620   CALCIUM 9.6 07/24/2019 1620   GFRNONAA >60 03/18/2016 0742   GFRAA >60  03/18/2016 0742    Lipid Panel     Component Value Date/Time   CHOL 226 (H) 02/21/2019 1134   TRIG (H) 02/21/2019 1134    517.0 Triglyceride is over 400; calculations on Lipids are invalid.   HDL 28.70 (L) 02/21/2019 1134   CHOLHDL 8 02/21/2019 1134    CBC    Component Value Date/Time   WBC 12.2 (H) 07/24/2019 1620   RBC 5.49 07/24/2019 1620   HGB 14.8 07/24/2019 1620   HCT 44.6 07/24/2019 1620   PLT 316.0 07/24/2019 1620   MCV 81.3 07/24/2019 1620   MCH 27.7 03/18/2016 0742   MCHC 33.1 07/24/2019 1620   RDW 14.7 07/24/2019 1620   LYMPHSABS 3.0 07/24/2019 1620   MONOABS 0.6 07/24/2019 1620   EOSABS 0.2 07/24/2019 1620  BASOSABS 0.1 07/24/2019 1620    Hgb A1C Lab Results  Component Value Date   HGBA1C 5.6 02/21/2019        Assessment and Plan:  Acute Headache, Runny Nose, Nasal Congestion, Ear Fullness, Sore Throat, Cough:  Will have him schedule carside Covid and flu swab No indication for additional abx at this time RX for Pred Taper x 6 days Work note provided  Will follow up after testing is back, return/ER precautions discussed   Follow Up Instructions:    I discussed the assessment and treatment plan with the patient. The patient was provided an opportunity to ask questions and all were answered. The patient agreed with the plan and demonstrated an understanding of the instructions.   The patient was advised to call back or seek an in-person evaluation if the symptoms worsen or if the condition fails to improve as anticipated.     Nicki Reaper, NP

## 2020-10-22 ENCOUNTER — Other Ambulatory Visit: Payer: Self-pay

## 2020-10-22 ENCOUNTER — Ambulatory Visit (INDEPENDENT_AMBULATORY_CARE_PROVIDER_SITE_OTHER): Payer: Self-pay | Admitting: Licensed Clinical Social Worker

## 2020-10-22 ENCOUNTER — Encounter: Payer: Self-pay | Admitting: Licensed Clinical Social Worker

## 2020-10-22 DIAGNOSIS — F411 Generalized anxiety disorder: Secondary | ICD-10-CM

## 2020-10-22 NOTE — Progress Notes (Signed)
Virtual Visit via Video Note  I connected with Gerald [name] on 10/22/20 at  9:00 AM EST by a video enabled telemedicine application and verified that I am speaking with the correct person using two identifiers.  Participating Parties Patient Provider  Location: Patient: Home Provider: Home Office   I discussed the limitations of evaluation and management by telemedicine and the availability of in person appointments. The patient expressed understanding and agreed to proceed.  THERAPY PROGRESS NOTE  Session Time: 54 Minutes  Participation Level: Active  Behavioral Response: Casual and Well GroomedAlertEuthymic  Type of Therapy: Individual Therapy  Treatment Goals addressed: Anxiety and Coping  Interventions: CBT  Summary: Gerald [name] is a 35 y.o. male who presents with anxiety sxs. Pt reported that reading The Four Agreements and material from last session was "enlightening" and validated what "I thought and felt for years growing up". Pt identified main triggers for anxiety as "not being in control of situations and fear of failure" and believes he has gained better insights. Pt reported "I am in a better headspace" and would like to work on finding better ways to cope with his anxiety.   Suicidal/Homicidal: No  Therapist Response: Therapist met with patient for follow up. Therapist and patient reviewed progress and continued barriers towards goals. Therapist validated patient feelings/experiences. Therapist provided reframing and alternative perspectives to assist patient in making sense of past experiences. Therapist and patient discussed ways to cope including journaling and reading. Pt was receptive.  Plan: Return again in 3 weeks.  Diagnosis: Axis I: Generalized Anxiety Disorder    Axis II: N/A  Follow Up Instructions:  I discussed the treatment plan update with the patient. The patient was provided an opportunity to ask questions and all were answered. The patient  agreed with the plan and demonstrated an understanding of the instructions.   The patient was advised to call back or seek an in-person evaluation if the symptoms worsen or if the condition fails to improve as anticipated.  I provided 60 minutes of non-face-to-face time during this encounter.   Rynell Ciotti Wynelle Link, LCSW, LCAS

## 2020-10-23 ENCOUNTER — Encounter: Payer: Self-pay | Admitting: Internal Medicine

## 2020-10-23 LAB — SARS-COV-2, NAA 2 DAY TAT

## 2020-10-23 LAB — NOVEL CORONAVIRUS, NAA: SARS-CoV-2, NAA: NOT DETECTED

## 2020-11-12 ENCOUNTER — Ambulatory Visit (INDEPENDENT_AMBULATORY_CARE_PROVIDER_SITE_OTHER): Payer: Self-pay | Admitting: Licensed Clinical Social Worker

## 2020-11-12 ENCOUNTER — Other Ambulatory Visit: Payer: Self-pay

## 2020-11-12 ENCOUNTER — Encounter: Payer: Self-pay | Admitting: Licensed Clinical Social Worker

## 2020-11-12 DIAGNOSIS — F411 Generalized anxiety disorder: Secondary | ICD-10-CM

## 2020-11-12 NOTE — Progress Notes (Signed)
Virtual Visit via Video Note  I connected with Gerald [name] on 11/12/20 at  9:00 AM EST by a video enabled telemedicine application and verified that I am speaking with the correct person using two identifiers.  Participating Parties Patient Provider  Location: Patient: Home Provider: Home Office   I discussed the limitations of evaluation and management by telemedicine and the availability of in person appointments. The patient expressed understanding and agreed to proceed.  THERAPY PROGRESS NOTE  Session Time: 12 Minutes  Participation Level: Active  Behavioral Response: AlertEuthymic  Type of Therapy: Individual Therapy  Treatment Goals addressed: Anxiety and Coping  Identify an anxiety coping mechanism and increase its use Understand how thoughts, feelings, and actions contribute to anxiety and it's treatment Increase understanding of beliefs and messages that produce the worry and anxiety  Interventions: CBT  Summary: Gerald [name] is a 35 y.o. male who presents with anxiety sxs. Pt apologized for being late to session due to "work related drama". Patient camera was off, however patient could see and hear therapist throughout visit. Pt reported he was receiving angry messages on his phone from former employee that was fired recently. Pt reported "I am trying not to take it personally". Pt reported "my wife has noticed that whenever there are triggers similar to what I experienced in my childhood that I get very defensive". Pt described an example of this involving son who was injured while at school and wife was unable to respond to phone call from the school. Pt reported he was "frustrated that she didn't answer the phone" right away even though the situation was handled appropriately by everyone involved. Pt reported other than that he has noticed an overall reduction in intensity and frequency of anxiety sxs. Pt reported he wanted to learn ways to manage anxiety and reactions to  conflicts "so that I don't get easily frustrated when triggers come up".  Suicidal/Homicidal: No  Therapist Response: Therapist met with patient for follow up. Therapist and patient processed thoughts, feelings and reactions to current stressors. Therapist validated patient feelings/concerns. Therapist provided psychoeducation around thought stopping technique and defense mechanisms. Pt was receptive. Therapist assigned patient reading material for homework on these topics.  Plan: Return again in 3 weeks.  Diagnosis: Axis I: Generalized Anxiety Disorder    Axis II: N/A  [name], LCSW, LCAS 11/12/2020

## 2020-12-03 ENCOUNTER — Ambulatory Visit: Payer: Self-pay | Admitting: Licensed Clinical Social Worker

## 2020-12-03 ENCOUNTER — Other Ambulatory Visit: Payer: Self-pay

## 2021-04-08 ENCOUNTER — Other Ambulatory Visit: Payer: Self-pay | Admitting: Psychiatry

## 2021-04-08 DIAGNOSIS — F411 Generalized anxiety disorder: Secondary | ICD-10-CM

## 2021-04-12 ENCOUNTER — Other Ambulatory Visit: Payer: Self-pay | Admitting: Psychiatry

## 2021-04-12 DIAGNOSIS — F411 Generalized anxiety disorder: Secondary | ICD-10-CM

## 2021-04-13 NOTE — Telephone Encounter (Signed)
He has not seen since last year. Please make follow up with Dr. Elna Breslow. I ordered only for a month of venlafaxine based on the request from the pharmacy.

## 2021-04-22 ENCOUNTER — Ambulatory Visit: Payer: BC Managed Care – PPO | Admitting: Primary Care

## 2021-04-22 ENCOUNTER — Encounter: Payer: Self-pay | Admitting: Primary Care

## 2021-04-22 ENCOUNTER — Other Ambulatory Visit: Payer: Self-pay

## 2021-04-22 VITALS — BP 126/70 | HR 97 | Temp 97.1°F | Ht 71.0 in | Wt 293.0 lb

## 2021-04-22 DIAGNOSIS — F411 Generalized anxiety disorder: Secondary | ICD-10-CM

## 2021-04-22 DIAGNOSIS — R519 Headache, unspecified: Secondary | ICD-10-CM

## 2021-04-22 DIAGNOSIS — G43709 Chronic migraine without aura, not intractable, without status migrainosus: Secondary | ICD-10-CM

## 2021-04-22 DIAGNOSIS — K219 Gastro-esophageal reflux disease without esophagitis: Secondary | ICD-10-CM

## 2021-04-22 MED ORDER — RIZATRIPTAN BENZOATE 5 MG PO TABS: ORAL_TABLET | ORAL | 0 refills | Status: DC

## 2021-04-22 MED ORDER — TOPIRAMATE 50 MG PO TABS: 50.0000 mg | ORAL_TABLET | Freq: Every day | ORAL | 0 refills | Status: DC

## 2021-04-22 MED ORDER — VENLAFAXINE HCL ER 75 MG PO CP24: 75.0000 mg | ORAL_CAPSULE | Freq: Every day | ORAL | 3 refills | Status: DC

## 2021-04-22 MED ORDER — OMEPRAZOLE 20 MG PO CPDR
DELAYED_RELEASE_CAPSULE | ORAL | 3 refills | Status: DC
Start: 2021-04-22 — End: 2023-09-15

## 2021-04-22 NOTE — Assessment & Plan Note (Signed)
Doing well on venlafaxine ER 75 mg daily, refills provided. Is no longer following with psychiatry.

## 2021-04-22 NOTE — Patient Instructions (Addendum)
Start topiramate (Topamax) 50 mg at bedtime for headache prevention.   You may take rizatriptan (Maxalt) 5 mg for bad migraines. Take 1 tablet at migraine onset, may repeat 2 hours later if migraine persists.  Notify me if you'd like to pursue the xray as discussed.    It was a pleasure to see you today!

## 2021-04-22 NOTE — Progress Notes (Signed)
Subjective:    Patient ID: Gerald [name], male    DOB: 1986/06/16, 35 y.o.   MRN: [id number]  HPI  Gerald [name] is a very pleasant 35 y.o. male who presents today to discuss knee pain and headaches and for follow up.   1) Knee Pain: His knee pain is located to the left anterior knee which occurred after scraping his knee on the bottom of the pool when diving down a few weeks ago. Pain was initially moderate to severe, improved that night and for a few days. The following week he began to experience more moderate pain, his wife noticed an "indention" to the anterior patella.   He notices pain with pressure when twisting a certain direction. He pops his knees often with relief. He's not popping his knee now due to a stabbing pain that occurs just prior to the pop.   He notices his pain with walking, bending down, not too bothersome. Overall his pain has improved. He denies erythema and swelling.   2) Migraines/headaches: Chronic. Migraines occurring twice monthly, headaches occurring 1-3 days weekly. Headaches are located behind his eyes, occurring 2-3 days weekly. Some days his pain is worse requiring a dark and quite room.   He was once managed on Propranolol ER for prevention of headaches, but he could not tolerate. He will now take Excedrin Migraine 2-3 times weekly with improvement.   3) GAD: Currently managed on venlafaxine XR 75 mg. Previously following with psychiatry but cannot afford to continue due to high co-pays and bills. He is requesting a refill of venlafaxine and to continue this through [place] for this prescription. Doing well on this regimen.  He is not taking hydroxyzine much as it causes too much drowsiness at 25 mg but not enough effect at 10 mg.   4) GERD: Chronic. Currently taking omeprazole 20 mg OTC once weekly PRN due to cost, will take at least four Tums at a time to get relief. Needs Rx sent to pharmacy that may require PA.   Review of Systems  Eyes:  Positive for  photophobia.  Respiratory:  Negative for shortness of breath.   Cardiovascular:  Negative for chest pain.  Gastrointestinal:        GERD  Musculoskeletal:  Positive for arthralgias.  Neurological:  Positive for headaches.  Psychiatric/Behavioral:  The patient is not nervous/anxious.         Past Medical History:  Diagnosis Date   Anxiety    Complication of anesthesia    Difficult intubation    Headache    migraiones   Lyme disease     Social History   Socioeconomic History   Marital status: Married    Spouse name: Not on file   Number of children: Not on file   Years of education: Not on file   Highest education level: Not on file  Occupational History   Occupation: Event organiser: Production manager  Tobacco Use   Smoking status: Never   Smokeless tobacco: Never  Substance and Sexual Activity   Alcohol use: Yes    Comment: OCCASIONAL   Drug use: No   Sexual activity: Not on file  Other Topics Concern   Not on file  Social History Narrative   Agricultural consultant for [employer]   Married   2 kids, 3rd child died of hypoplastic L heart 2012/01/27   Social Determinants of Health   Financial Resource Strain: Not on file  Food Insecurity: Not on file  Transportation Needs: Not on file  Physical Activity: Not on file  Stress: Not on file  Social Connections: Not on file  Intimate Partner Violence: Not on file    Past Surgical History:  Procedure Laterality Date   CHOLECYSTECTOMY N/A 04/08/2016   Procedure: LAPAROSCOPIC CHOLECYSTECTOMY WITH INTRAOPERATIVE CHOLANGIOGRAM;  Surgeon: Ricarda Frame, MD;  Location: [facility] ORS;  Service: General;  Laterality: N/A;   TONSILLECTOMY AND ADENOIDECTOMY  ~1991    Family History  Problem Relation Age of Onset   Drug abuse Mother    Hypertension Mother    Mental illness Mother    Diabetes Father    Bipolar disorder Sister    Depression Brother     Allergies  Allergen Reactions   Amoxicillin Hives    Has patient had a PCN  reaction causing immediate rash, facial/tongue/throat swelling, SOB or lightheadedness with hypotension: Yes Has patient had a PCN reaction causing severe rash involving mucus membranes or skin necrosis: Yes Has patient had a PCN reaction that required hospitalization No Has patient had a PCN reaction occurring within the last 10 years: No If all of the above answers are "NO", then may proceed with Cephalosporin use.     Current Outpatient Medications on File Prior to Visit  Medication Sig Dispense Refill   hydrOXYzine (ATARAX/VISTARIL) 50 MG tablet TAKE 0.5-1 TABLETS (25-50 MG TOTAL) BY MOUTH AT BEDTIME AS NEEDED. FOR SLEEP 90 tablet 2   Melatonin 10 MG TABS Take 10 mg by mouth.     naproxen sodium (ALEVE) 220 MG tablet Take 220 mg by mouth daily as needed.     No current facility-administered medications on file prior to visit.    BP 126/70   Pulse 97   Temp (!) 97.1 F (36.2 C) (Temporal)   Ht 5\' 11"  (1.803 m)   Wt 293 lb (132.9 kg)   SpO2 97%   BMI 40.87 kg/m  Objective:   Physical Exam Cardiovascular:     Rate and Rhythm: Normal rate and regular rhythm.  Pulmonary:     Effort: Pulmonary effort is normal.     Breath sounds: Normal breath sounds. No wheezing or rales.  Musculoskeletal:     Cervical back: Neck supple.     Right knee: No swelling, deformity, erythema or bony tenderness. Normal range of motion. No tenderness. Normal alignment.     Left knee: No swelling, deformity or erythema. Normal range of motion. No tenderness. No LCL laxity, MCL laxity or ACL laxity. Skin:    General: Skin is warm and dry.  Neurological:     Mental Status: He is alert and oriented to person, place, and time.          Assessment & Plan:      This visit occurred during the SARS-CoV-2 public health emergency.  Safety protocols were in place, including screening questions prior to the visit, additional usage of staff PPE, and extensive cleaning of exam room while observing  appropriate contact time as indicated for disinfecting solutions.

## 2021-04-22 NOTE — Assessment & Plan Note (Addendum)
Refilled omeprazole 20mg. 

## 2021-04-22 NOTE — Assessment & Plan Note (Signed)
Occurring twice monthly with frequent headaches during the week.  Will work to treat frequent headaches. Rx for rizatriptan 5 mg sent to pharmacy for migraine abortion. Drowsiness precautions provided.

## 2021-04-22 NOTE — Assessment & Plan Note (Signed)
Occurring 2-3 times weekly, bothersome.  Could not tolerate Propranolol ER, will trial Topamax 50 mg HS. He will update.

## 2021-05-17 ENCOUNTER — Encounter: Payer: Self-pay | Admitting: Emergency Medicine

## 2021-05-17 ENCOUNTER — Other Ambulatory Visit: Payer: Self-pay

## 2021-05-17 ENCOUNTER — Emergency Department
Admission: EM | Admit: 2021-05-17 | Discharge: 2021-05-17 | Disposition: A | Discharge status: Home / Self Care | Payer: BC Managed Care – PPO | Attending: Emergency Medicine | Admitting: Emergency Medicine

## 2021-05-17 DIAGNOSIS — H9203 Otalgia, bilateral: Secondary | ICD-10-CM | POA: Diagnosis not present

## 2021-05-17 DIAGNOSIS — Z5321 Procedure and treatment not carried out due to patient leaving prior to being seen by health care provider: Secondary | ICD-10-CM | POA: Diagnosis not present

## 2021-05-17 DIAGNOSIS — R0981 Nasal congestion: Secondary | ICD-10-CM | POA: Diagnosis not present

## 2021-05-17 NOTE — ED Triage Notes (Signed)
Pt reports some nasal congestion for several days and today woke up with pain to both of his ears.

## 2021-05-17 NOTE — ED Triage Notes (Signed)
RN getting ready to take pt VS and pt wife comes in and reports [facility] could take him straight away so they were leaving.

## 2021-05-18 ENCOUNTER — Other Ambulatory Visit: Payer: Self-pay | Admitting: Primary Care

## 2021-05-18 DIAGNOSIS — R519 Headache, unspecified: Secondary | ICD-10-CM

## 2021-05-18 NOTE — Telephone Encounter (Signed)
Left message to return call to our office.  

## 2021-05-18 NOTE — Telephone Encounter (Signed)
How he is doing since we initiated Topamax 50 mg for headache prevention? Is this helping to prevent headaches?

## 2021-05-22 NOTE — Telephone Encounter (Signed)
Called patient states that he stopped taking after about 2 weeks due to changes in taste.

## 2021-08-11 DIAGNOSIS — Z20828 Contact with and (suspected) exposure to other viral communicable diseases: Secondary | ICD-10-CM

## 2021-08-11 DIAGNOSIS — R051 Acute cough: Secondary | ICD-10-CM

## 2021-08-12 MED ORDER — OSELTAMIVIR PHOSPHATE 75 MG PO CAPS: 75.0000 mg | ORAL_CAPSULE | Freq: Two times a day (BID) | ORAL | 0 refills | Status: DC

## 2021-08-13 MED ORDER — OSELTAMIVIR PHOSPHATE 75 MG PO CAPS: 75.0000 mg | ORAL_CAPSULE | Freq: Two times a day (BID) | ORAL | 0 refills | Status: DC

## 2021-08-21 ENCOUNTER — Other Ambulatory Visit: Payer: Self-pay

## 2021-08-21 ENCOUNTER — Encounter: Payer: Self-pay | Admitting: Primary Care

## 2021-08-21 ENCOUNTER — Ambulatory Visit: Payer: BC Managed Care – PPO | Admitting: Primary Care

## 2021-08-21 VITALS — BP 120/80 | HR 102 | Temp 98.7°F | Ht 71.0 in | Wt 289.0 lb

## 2021-08-21 DIAGNOSIS — J069 Acute upper respiratory infection, unspecified: Secondary | ICD-10-CM

## 2021-08-21 DIAGNOSIS — G43709 Chronic migraine without aura, not intractable, without status migrainosus: Secondary | ICD-10-CM | POA: Diagnosis not present

## 2021-08-21 DIAGNOSIS — H938X1 Other specified disorders of right ear: Secondary | ICD-10-CM

## 2021-08-21 DIAGNOSIS — F411 Generalized anxiety disorder: Secondary | ICD-10-CM

## 2021-08-21 MED ORDER — PREDNISONE 20 MG PO TABS: ORAL_TABLET | ORAL | 0 refills | Status: DC

## 2021-08-21 NOTE — Patient Instructions (Signed)
Start prednisone 20 mg tablets. Take 2 tablets by mouth once daily for 5 days.  You will be contacted regarding your MRI of the head.  Please let us know if you have not been contacted within two weeks.   It was a pleasure to see you today!

## 2021-08-21 NOTE — Progress Notes (Signed)
Subjective:    Patient ID: Gerald [name], male    DOB: [birth year], 35 y.o.   MRN: [id number]  HPI  Gerald [name] is a very pleasant 35 y.o. male with a history of migraines, GAD, hyperlipidemia who presents today for multiple symptoms.  1) URI Symptoms: His son tested positive for influenza last week Tuesday. The following day the patient began feeling sluggish, fatigued, cough, ear fullness, fever so he messaged me online and Tamiflu was sent to the pharmacy. Unfortunately Tamiflu was out of stock with his pharmacy and did not come in until three days later. He never picked up the Tamiflu as he began to feel better.   Two days ago he came home from work feeling fatigued, took a nap which is abnormal. His wife checked his temperature that evening which was 102. Since then he's coughing, fatigued, running a low grade fever, right eye pressure radiating through his right occipital lobe, ear fullness. His last fever was yesterday which was 99.   Today he's noticed chest congestion, fatigue, or right eye and head pressure. He's taken Sudafed with improvement in headache, Excedrin Migraine without improvement.   He took a Covid-19 test two evenings ago which was negative.   2) Migraines: Long history of headaches and migraines, currently experiencing two headaches weekly and two migraines monthly. Headaches and migraines are always located behind his eyes and frontal lobes. He will take Aleve or Excedrin Migraine with improvement or resolve.   He is prescribed Topamax 50 mg in July 2022, took for about 3 weeks and stopped due to ineffectiveness. He is prescribed rizatriptan for which he took once a few weeks ago, felt nauseated and vomited. He's had this same reaction previously with another triptan medication. He's tried propranolol for migraine prevention which caused headaches and vomiting.   He's never undergone imaging of his brain.  He has noticed that his eyes seem to be bulging more than  usual.  He denies a family history of thyroid disease.   He does believe that his anxiety contributes to his migraines. Overall he does feel well managed on venlafaxine ER 75 mg daily.   BP Readings from Last 3 Encounters:  08/21/21 120/80  04/22/21 126/70  06/06/20 (!) 142/82       Review of Systems  Constitutional:  Positive for fatigue and fever.  HENT:  Positive for congestion.        Ear fullness  Respiratory:  Positive for cough.   Neurological:  Positive for headaches.        Past Medical History:  Diagnosis Date   Anxiety    Complication of anesthesia    Difficult intubation    Headache    migraiones   Lyme disease     Social History   Socioeconomic History   Marital status: Married    Spouse name: Not on file   Number of children: Not on file   Years of education: Not on file   Highest education level: Not on file  Occupational History   Occupation: Event organiser: Production manager  Tobacco Use   Smoking status: Never   Smokeless tobacco: Never  Substance and Sexual Activity   Alcohol use: Yes    Comment: OCCASIONAL   Drug use: No   Sexual activity: Not on file  Other Topics Concern   Not on file  Social History Narrative   Agricultural consultant for [employer]   Married   2 kids, 3rd child died of  hypoplastic L heart 2013   Social Determinants of Health   Financial Resource Strain: Not on file  Food Insecurity: Not on file  Transportation Needs: Not on file  Physical Activity: Not on file  Stress: Not on file  Social Connections: Not on file  Intimate Partner Violence: Not on file    Past Surgical History:  Procedure Laterality Date   CHOLECYSTECTOMY N/A 04/08/2016   Procedure: LAPAROSCOPIC CHOLECYSTECTOMY WITH INTRAOPERATIVE CHOLANGIOGRAM;  Surgeon: Ricarda Frame, MD;  Location: [facility] ORS;  Service: General;  Laterality: N/A;   TONSILLECTOMY AND ADENOIDECTOMY  ~1991    Family History  Problem Relation Age of Onset   Drug abuse  Mother    Hypertension Mother    Mental illness Mother    Diabetes Father    Bipolar disorder Sister    Depression Brother     Allergies  Allergen Reactions   Amoxicillin Hives    Has patient had a PCN reaction causing immediate rash, facial/tongue/throat swelling, SOB or lightheadedness with hypotension: Yes Has patient had a PCN reaction causing severe rash involving mucus membranes or skin necrosis: Yes Has patient had a PCN reaction that required hospitalization No Has patient had a PCN reaction occurring within the last 10 years: No If all of the above answers are "NO", then may proceed with Cephalosporin use.     Current Outpatient Medications on File Prior to Visit  Medication Sig Dispense Refill   Melatonin 10 MG TABS Take 10 mg by mouth.     naproxen sodium (ALEVE) 220 MG tablet Take 220 mg by mouth daily as needed.     omeprazole (PRILOSEC) 20 MG capsule TAKE 1 CAPSULE BY MOUTH EVERY DAY for heartburn. 90 capsule 3   venlafaxine XR (EFFEXOR-XR) 75 MG 24 hr capsule Take 1 capsule (75 mg total) by mouth daily with breakfast. For anxiety and depression 90 capsule 3   hydrOXYzine (ATARAX/VISTARIL) 50 MG tablet TAKE 0.5-1 TABLETS (25-50 MG TOTAL) BY MOUTH AT BEDTIME AS NEEDED. FOR SLEEP (Patient not taking: Reported on 08/21/2021) 90 tablet 2   rizatriptan (MAXALT) 5 MG tablet Take 1 tablet by mouth at migraine onset. May repeat in 2 hours if needed. (Patient not taking: Reported on 08/21/2021) 10 tablet 0   topiramate (TOPAMAX) 50 MG tablet Take 1 tablet (50 mg total) by mouth at bedtime. For headache prevention. (Patient not taking: Reported on 08/21/2021) 30 tablet 0   No current facility-administered medications on file prior to visit.    BP 120/80   Pulse (!) 102   Temp 98.7 F (37.1 C) (Temporal)   Ht 5\' 11"  (1.803 m)   Wt 289 lb (131.1 kg)   SpO2 96%   BMI 40.31 kg/m  Objective:   Physical Exam HENT:     Right Ear: Tympanic membrane and ear canal normal.      Left Ear: Tympanic membrane and ear canal normal.     Nose: No mucosal edema.     Right Sinus: No maxillary sinus tenderness or frontal sinus tenderness.     Left Sinus: No maxillary sinus tenderness or frontal sinus tenderness.  Eyes:     Conjunctiva/sclera: Conjunctivae normal.     Comments: Both eyes appear slightly more bulged outward than usual.  Cardiovascular:     Rate and Rhythm: Normal rate and regular rhythm.  Pulmonary:     Effort: Pulmonary effort is normal.     Breath sounds: Normal breath sounds. No wheezing or rales.  Musculoskeletal:  Cervical back: Neck supple.  Skin:    General: Skin is warm and dry.          Assessment & Plan:      This visit occurred during the SARS-CoV-2 public health emergency.  Safety protocols were in place, including screening questions prior to the visit, additional usage of staff PPE, and extensive cleaning of exam room while observing appropriate contact time as indicated for disinfecting solutions.

## 2021-08-21 NOTE — Assessment & Plan Note (Signed)
Intolerant to triptans, propanolol. Topamax ineffective after 3 weeks  Today's headache does seem to be more sinus/viral URI related.  Will obtain MRI of the brain to rule out any other causes.  His eye bulging finding is abnormal. TSH from 2020 1.85.  Treat headache and ear fullness with prednisone burst.  Consider nortriptyline/amitriptyline for migraine prevention.  Will await update and MRI results.

## 2021-08-21 NOTE — Assessment & Plan Note (Signed)
Symptoms continue to appear viral, he is slowly improving which is reassuring.  Exam today overall reassuring.  Will treat headache/ear fullness with prednisone burst.  Prescription sent to pharmacy.  Discussed to refrain from Sudafed use, and to refrain from using more than 5 consecutive days.

## 2021-08-21 NOTE — Assessment & Plan Note (Signed)
Overall feels stable on venlafaxine ER 75 mg, continue same.

## 2021-08-31 ENCOUNTER — Encounter: Payer: Self-pay | Admitting: *Deleted

## 2021-09-12 ENCOUNTER — Other Ambulatory Visit: Payer: Self-pay

## 2021-09-12 ENCOUNTER — Ambulatory Visit
Admission: RE | Admit: 2021-09-12 | Discharge: 2021-09-12 | Disposition: A | Discharge status: Home / Self Care | Payer: BC Managed Care – PPO | Source: Ambulatory Visit | Attending: Primary Care | Admitting: Primary Care

## 2021-09-12 DIAGNOSIS — R519 Headache, unspecified: Secondary | ICD-10-CM | POA: Diagnosis not present

## 2021-09-12 DIAGNOSIS — G43709 Chronic migraine without aura, not intractable, without status migrainosus: Secondary | ICD-10-CM | POA: Diagnosis not present

## 2021-09-12 DIAGNOSIS — G43909 Migraine, unspecified, not intractable, without status migrainosus: Secondary | ICD-10-CM | POA: Diagnosis not present

## 2021-09-16 NOTE — Telephone Encounter (Signed)
Message sent to patient to call for appointment.

## 2021-09-23 ENCOUNTER — Other Ambulatory Visit: Payer: Self-pay

## 2021-09-23 ENCOUNTER — Encounter: Payer: Self-pay | Admitting: Primary Care

## 2021-09-23 ENCOUNTER — Ambulatory Visit: Payer: BC Managed Care – PPO | Admitting: Primary Care

## 2021-09-23 VITALS — BP 124/78 | HR 105 | Temp 97.6°F | Ht 71.0 in | Wt 308.0 lb

## 2021-09-23 DIAGNOSIS — F900 Attention-deficit hyperactivity disorder, predominantly inattentive type: Secondary | ICD-10-CM | POA: Insufficient documentation

## 2021-09-23 DIAGNOSIS — R1901 Right upper quadrant abdominal swelling, mass and lump: Secondary | ICD-10-CM

## 2021-09-23 DIAGNOSIS — R41840 Attention and concentration deficit: Secondary | ICD-10-CM | POA: Diagnosis not present

## 2021-09-23 DIAGNOSIS — G43809 Other migraine, not intractable, without status migrainosus: Secondary | ICD-10-CM | POA: Diagnosis not present

## 2021-09-23 HISTORY — DX: Right upper quadrant abdominal swelling, mass and lump: R19.01

## 2021-09-23 MED ORDER — BUPROPION HCL 75 MG PO TABS: 75.0000 mg | ORAL_TABLET | Freq: Two times a day (BID) | ORAL | 0 refills | Status: DC

## 2021-09-23 NOTE — Assessment & Plan Note (Signed)
Suspect hernia. Will obtain RUQ abdominal ultrasound for further evaluation.  He is stable for outpatient evaluation.

## 2021-09-23 NOTE — Assessment & Plan Note (Signed)
Unclear if symptoms represent true ADHD, but will refer patient for full testing. Referral placed.  We also discussed a trial of bupropion SR 75 mg BID for focus and depression, he agrees and would like to try.  Rx for bupropion SR 75 mg sent to pharmacy, discussed instructions for starting treatment. He will update.  Continue venlafaxine ER 75 mg.

## 2021-09-23 NOTE — Progress Notes (Signed)
Subjective:    Patient ID: Gerald [name], male    DOB: [birth year], 35 y.o.   MRN: [id number]  HPI  Gerald [name] is a very pleasant 35 y.o. male with a history of migraines, frequent headaches, GAD, obesity who presents today for follow up of migraines and to discuss abdominal mass.  1) Migraines/Headaches: He was last evaluated one month ago for recurrent headaches and migraines among other symptoms. MRI of brain was obtained given ongoing migraines/headaches which was negative for acute process or masses, did show scattered white matter in the frontal lobe. He updated [place] a few weeks later notifying us that headaches had improved.   Today he endorses continued improvement in headaches. Historically he uses Allegra/Zyrtec/Claritin and Flonase each Spring for chronic nasal congestion and pressure, post nasal drip, but this regimen isn't effective in preventing migraine/headache prevention.   He did not like Topamax as it caused altered taste.   2) Abdominal Mass: Located to the right upper quadrant for which his wife first noticed a few weeks ago. He has noticed the mass which is near the incision of his gall bladder since his surgery, about the size of a golf ball, thinks it's more pronounced since.   He does notice pain when his wife presses on the region, otherwise he has no pain. He does notice pressure to the site when eating large amounts.   He denies fevers, nausea, vomiting, constipation, diarrhea.   3) Difficulty Focusing: He's noticed as he's gotten older, the decreased ability to focus, becomes easily distracted, can't tune into things, fidgets with objects. He has a known history of GAD and is managed on venlafaxine XR 75.   He has a family history of ADHD in his brother, his youngest daughter, his nephews. He recently took one of his co-workers Adderall and noticed improvement in symptoms.   Review of Systems  Respiratory:  Negative for shortness of breath.   Cardiovascular:   Negative for chest pain.  Gastrointestinal:  Negative for blood in stool, constipation, diarrhea, nausea and vomiting.       Abdominal mass  Neurological:  Negative for headaches.  Psychiatric/Behavioral:  Positive for decreased concentration.        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  Tobacco Use   Smoking status: Never   Smokeless tobacco: Never  Substance and Sexual Activity   Alcohol use: Yes    Comment: OCCASIONAL   Drug use: No   Sexual activity: Not on file  Other Topics Concern   Not on file  [place] for [employer]   Married   2 kids, 3rd child died of hypoplastic L heart 01/27/12   Social Determinants of Health   Financial Resource Strain: Not on file  Food Insecurity: Not on file  Transportation Needs: Not on file  Physical Activity: Not on file  Stress: Not on file  Social Connections: Not on file  Intimate  Partner Violence: Not on file    Past Surgical History:  Procedure Laterality Date   CHOLECYSTECTOMY N/A 04/08/2016   Procedure: LAPAROSCOPIC CHOLECYSTECTOMY WITH INTRAOPERATIVE CHOLANGIOGRAM;  Surgeon: Ricarda Frame, MD;  Location: [facility] OR[facility];  Service: General;  Laterality: N/A;   TONSILLECTOMY AND ADENOIDECTOMY  ~1991    Family History  Problem Relation Age of Onset   Drug abuse Mother    Hypertension Mother    Mental illness Mother    Diabetes Father    Bipolar disorder Sister    Depression Brother     Allergies  Allergen Reactions   Amoxicillin Hives    Has patient had a PCN reaction causing immediate  rash, facial/tongue/throat swelling, SOB or lightheadedness with hypotension: Yes Has patient had a PCN reaction causing severe rash involving mucus membranes or skin necrosis: Yes Has patient had a PCN reaction that required hospitalization No Has patient had a PCN reaction occurring within the last 10 years: No If all of the above answers are "NO", then may proceed with Cephalosporin use.     Current Outpatient Medications on File Prior to Visit  Medication Sig Dispense Refill   Melatonin 10 MG TABS Take 10 mg by mouth.     naproxen sodium (ALEVE) 220 MG tablet Take 220 mg by mouth daily as needed.     omeprazole (PRILOSEC) 20 MG capsule TAKE 1 CAPSULE BY MOUTH EVERY DAY for heartburn. 90 capsule 3   venlafaxine XR (EFFEXOR-XR) 75 MG 24 hr capsule Take 1 capsule (75 mg total) by mouth daily with breakfast. For anxiety and depression 90 capsule 3   hydrOXYzine (ATARAX/VISTARIL) 50 MG tablet TAKE 0.5-1 TABLETS (25-50 MG TOTAL) BY MOUTH AT BEDTIME AS NEEDED. FOR SLEEP (Patient not taking: Reported on 09/23/2021) 90 tablet 2   rizatriptan (MAXALT) 5 MG tablet Take 1 tablet by mouth at migraine onset. May repeat in 2 hours if needed. (Patient not taking: Reported on 09/23/2021) 10 tablet 0   topiramate (TOPAMAX) 50 MG tablet Take 1 tablet (50 mg total) by mouth at bedtime. For headache prevention. (Patient not taking: Reported on 09/23/2021) 30 tablet 0   No current facility-administered medications on file prior to visit.    BP 124/78    Pulse (!) 105    Temp 97.6 F (36.4 C) (Temporal)    Ht 5\' 11"  (1.803 m)    Wt (!) 308 lb (139.7 kg)    SpO2 96%    BMI 42.96 kg/m  Objective:   Physical Exam Cardiovascular:     Rate and Rhythm: Normal rate.  Pulmonary:     Effort: Pulmonary effort is normal.     Breath sounds: No wheezing or rales.  Abdominal:     Palpations: There is mass.     Tenderness: There is no abdominal tenderness.       Comments: Soft, baseball sized, deep mass to right  upper quadrant in between gall bladder and esophageal region. Non tender.  Musculoskeletal:     Cervical back: Neck supple.  Skin:    General: Skin is warm and dry.  Neurological:     Mental Status: He is alert and oriented to person, place, and time.          Assessment & Plan:      This visit occurred during the SARS-CoV-2 public health emergency.  Safety protocols were in place, including screening questions prior to the visit, additional usage of staff PPE, and extensive cleaning of exam room while observing appropriate contact time  as indicated for disinfecting solutions.

## 2021-09-23 NOTE — Patient Instructions (Addendum)
You will be contacted regarding your referral for ADHD testing.  Please let us know if you have not been contacted within two weeks.   Start bupropion SR 75 mg tablets for focus and depression. Start by taking 1 tablet by mouth once daily for 5 days, then increase to twice daily thereafter.  Continue venlafaxine ER 75 mg for anxiety.  You will be contacted regarding your ultrasound.  Please let us know if you have not been contacted within two weeks.  I will send in some allergy treatment soon.  It was a pleasure to see you today!

## 2021-09-23 NOTE — Assessment & Plan Note (Signed)
Improved and doing much better.  Suspect seasonal trigger for migraines, he agrees. Since he's had no improvement with OTC antihistamines with Flonase, will trial Rx.   Will do some research and send Rx to pharmacy for him to start in February 2023, just prior to seasonal changes. He agrees.  Continue PRN Maxalt.

## 2021-09-28 ENCOUNTER — Encounter: Payer: Self-pay | Admitting: *Deleted

## 2021-10-19 DIAGNOSIS — Z20822 Contact with and (suspected) exposure to covid-19: Secondary | ICD-10-CM | POA: Diagnosis not present

## 2021-10-19 NOTE — Telephone Encounter (Signed)
Pt sent my chart note that pt tested + covid on 10/19/21. Pt wants to know if pt should start any med. Pt did home test for covid that was + 10/19/21. [facility] also [facility]21. Pt said on 10/18/21 symptoms started such as head congestion, scratchy S/T ,runny nose, dry cough, SOB upon exertion (pt got winded after doing laundry today); pt said earlier today pt checked his smart watch and pulse was 120 at rest. Now heart rate is 75. Pt has not had CP but has had mid chest pressure on and off. Pt thinks slight chest pressure he is having now is due to cold symptoms. Pt has body  aches and chills on and off. Pt said goes between feeling hot and cold; pt has not actually taken temp; pt has had H/A pain level of 4 - 5 starting on 10/18/21. Now pt has slight H/A after taking Sudafed. Pt has not lost sense of taste or smell but pt is feeling very tired. Pt said he is not in distress and would like to wait for appt in office.Pt scheduled VV with Romilda Garret NP 10/20/21 at 8:40. Self quarantine, drink plenty of fluids, rest, and take Tylenol for fever. UC & ED precautions given and pt voiced understanding. Sending note to Romilda Garret NP and Azalee Course NP. Sending note to Gentry Fitz NP as PCP and Juluis Rainier, sending note to Dr Silvio Pate and Larene Beach CMA. Will also teams Ross.

## 2021-10-19 NOTE — Telephone Encounter (Signed)
Thanks. Will evaluate him at office visit

## 2021-10-20 ENCOUNTER — Other Ambulatory Visit: Payer: Self-pay

## 2021-10-20 ENCOUNTER — Encounter: Payer: Self-pay | Admitting: Nurse Practitioner

## 2021-10-20 ENCOUNTER — Telehealth (INDEPENDENT_AMBULATORY_CARE_PROVIDER_SITE_OTHER): Payer: BC Managed Care – PPO | Admitting: Nurse Practitioner

## 2021-10-20 DIAGNOSIS — U071 COVID-19: Secondary | ICD-10-CM

## 2021-10-20 MED ORDER — MOLNUPIRAVIR EUA 200MG CAPSULE: 4.0000 | ORAL_CAPSULE | Freq: Two times a day (BID) | ORAL | 0 refills | Status: AC

## 2021-10-20 NOTE — Progress Notes (Signed)
Patient ID: Gerald [name], male    DOB: 08-17-1986, 36 y.o.   MRN: [id number]  Virtual visit completed through [employer], a video enabled telemedicine application. Due to national recommendations of social distancing due to COVID-19, a virtual visit is felt to be most appropriate for this patient at this time. Reviewed limitations, risks, security and privacy concerns of performing a virtual visit and the availability of in person appointments. I also reviewed that there may be a patient responsible charge related to this service. The patient agreed to proceed.   Patient location: home Provider location: [place] [facility], office Persons participating in this virtual visit: patient, provider   If any vitals were documented, they were collected by patient at home unless specified below.    There were no vitals taken for this visit.   CC: Covid 19 Subjective:   HPI: Gerald [name] is a 36 y.o. male presenting on 10/20/2021 for Covid Positive (On 10/19/21, sx started 10/18/21-stuffy nose, cough, body aches, post nasal drip, sore throat, headache. )   Symptoms started on 10/18/2021 Covid test yesterday 10/19/2021 and was positive. [facility] Pfizer [facility]2  No sick contact Has been using tylenol and pseudophed and seemed to help       Relevant past medical, surgical, family and social history reviewed and updated as indicated. Interim medical history since our last visit reviewed. Allergies and medications reviewed and updated. Outpatient Medications Prior to Visit  Medication Sig Dispense Refill   buPROPion (WELLBUTRIN) 75 MG tablet Take 1 tablet (75 mg total) by mouth 2 (two) times daily. For focus and depression. 60 tablet 0   hydrOXYzine (ATARAX/VISTARIL) 50 MG tablet TAKE 0.5-1 TABLETS (25-50 MG TOTAL) BY MOUTH AT BEDTIME AS NEEDED. FOR SLEEP 90 tablet 2   Melatonin 10 MG TABS Take 10 mg by mouth.     naproxen sodium (ALEVE) 220 MG tablet Take 220 mg by mouth daily as needed.      omeprazole (PRILOSEC) 20 MG capsule TAKE 1 CAPSULE BY MOUTH EVERY DAY for heartburn. 90 capsule 3   rizatriptan (MAXALT) 5 MG tablet Take 1 tablet by mouth at migraine onset. May repeat in 2 hours if needed. 10 tablet 0   venlafaxine XR (EFFEXOR-XR) 75 MG 24 hr capsule Take 1 capsule (75 mg total) by mouth daily with breakfast. For anxiety and depression 90 capsule 3   No facility-administered medications prior to visit.     Per HPI unless specifically indicated in ROS section below Review of Systems  Constitutional:  Positive for chills, fatigue and fever. Negative for appetite change.  HENT:  Positive for congestion, ear pain (pressure int ear), postnasal drip, sinus pressure and sore throat.   Respiratory:  Positive for cough (dry) and shortness of breath (DOE).   Cardiovascular:  Negative for chest pain.  Gastrointestinal:  Negative for abdominal pain, diarrhea, nausea and vomiting.  Musculoskeletal:  Positive for arthralgias and myalgias.  Neurological:  Positive for headaches.  Objective:  There were no vitals taken for this visit.  Wt Readings from Last 3 Encounters:  09/23/21 (!) 308 lb (139.7 kg)  08/21/21 289 lb (131.1 kg)  04/22/21 293 lb (132.9 kg)       Physical exam: Gen: alert, NAD, not ill appearing Pulm: speaks in complete sentences without increased work of breathing Psych: normal mood, normal thought content      Results for orders placed or performed in visit on 10/21/20  Novel Coronavirus, NAA ([facility])   Specimen: Nasopharyngeal(NP)  swabs in vial transport medium   Nasopharynge  Result Value Ref Range   SARS-CoV-2, NAA Not Detected Not Detected  SARS-COV-2, NAA 2 DAY TAT   Nasopharynge  Result Value Ref Range   SARS-CoV-2, NAA 2 DAY TAT Performed   POC Influenza A&B(BINAX/QUICKVUE)  Result Value Ref Range   Influenza A, POC Negative Negative   Influenza B, POC Negative Negative   Assessment & Plan:   Problem List Items Addressed This Visit        Other   COVID-19 - Primary    Patient tested positive for COVID-19.  Did discuss antiviral treatments given age and comorbidities.  He elected to pursue.  Given the recent kidney function and unable to prescribe paxlovid. Did discuss molnupiravir and the side effects inclusive to reproductive uncertainty in the next 90 days.  He acknowledged and states that he is done having children.  Discussed CDC guidelines in regards to quarantine.  Did discuss signs and symptoms when to be seen urgently or emergently. Start molnupiravir as soon as possible      Relevant Medications   molnupiravir EUA (LAGEVRIO) 200 mg CAPS capsule     No orders of the defined types were placed in this encounter.  No orders of the defined types were placed in this encounter.   I discussed the assessment and treatment plan with the patient. The patient was provided an opportunity to ask questions and all were answered. The patient agreed with the plan and demonstrated an understanding of the instructions. The patient was advised to call back or seek an in-person evaluation if the symptoms worsen or if the condition fails to improve as anticipated.  Follow up plan: Return if symptoms worsen or fail to improve.  Gerald Nine, NP

## 2021-10-20 NOTE — Assessment & Plan Note (Signed)
Patient tested positive for COVID-19.  Did discuss antiviral treatments given age and comorbidities.  He elected to pursue.  Given the recent kidney function and unable to prescribe paxlovid. Did discuss molnupiravir and the side effects inclusive to reproductive uncertainty in the next 90 days.  He acknowledged and states that he is done having children.  Discussed CDC guidelines in regards to quarantine.  Did discuss signs and symptoms when to be seen urgently or emergently. Start molnupiravir as soon as possible

## 2021-10-21 ENCOUNTER — Other Ambulatory Visit: Payer: Self-pay | Admitting: Primary Care

## 2021-10-21 DIAGNOSIS — R41840 Attention and concentration deficit: Secondary | ICD-10-CM

## 2021-11-02 ENCOUNTER — Encounter: Payer: Self-pay | Admitting: *Deleted

## 2021-12-31 NOTE — Telephone Encounter (Signed)
This patient has not been see since 2021 so he would need to be re/est ?

## 2021-12-31 NOTE — Telephone Encounter (Signed)
We can close this time unless he contacts us/get any refill request. Will CC to Dr. Elna Breslow just fyi as he is the patient of hers.

## 2021-12-31 NOTE — Telephone Encounter (Signed)
Agree 

## 2022-04-28 ENCOUNTER — Telehealth: Payer: No Typology Code available for payment source | Admitting: Physician Assistant

## 2022-04-28 DIAGNOSIS — B9789 Other viral agents as the cause of diseases classified elsewhere: Secondary | ICD-10-CM

## 2022-04-28 DIAGNOSIS — J019 Acute sinusitis, unspecified: Secondary | ICD-10-CM | POA: Diagnosis not present

## 2022-04-28 MED ORDER — BENZONATATE 100 MG PO CAPS: 100.0000 mg | ORAL_CAPSULE | Freq: Three times a day (TID) | ORAL | 0 refills | Status: DC | PRN

## 2022-04-28 MED ORDER — AZELASTINE HCL 0.1 % NA SOLN: 1.0000 | Freq: Two times a day (BID) | NASAL | 0 refills | Status: DC

## 2022-04-28 NOTE — Progress Notes (Signed)
Virtual Visit Consent   Gerald [name], you are scheduled for a virtual visit with a Gerald [name] provider today. Just as with appointments in the office, your consent must be obtained to participate. Your consent will be active for this visit and any virtual visit you may have with one of our providers in the next 365 days. If you have a MyChart account, a copy of this consent can be sent to you electronically.  As this is a virtual visit, video technology does not allow for your provider to perform a traditional examination. This may limit your provider's ability to fully assess your condition. If your provider identifies any concerns that need to be evaluated in person or the need to arrange testing (such as labs, EKG, etc.), we will make arrangements to do so. Although advances in technology are sophisticated, we cannot ensure that it will always work on either your end or our end. If the connection with a video visit is poor, the visit may have to be switched to a telephone visit. With either a video or telephone visit, we are not always able to ensure that we have a secure connection.  By engaging in this virtual visit, you consent to the provision of healthcare and authorize for your insurance to be billed (if applicable) for the services provided during this visit. Depending on your insurance coverage, you may receive a charge related to this service.  I need to obtain your verbal consent now. Are you willing to proceed with your visit today? Gerald [name] has provided verbal consent on 04/28/2022 for a virtual visit (video or telephone). Gerald [name], [place]  Date: 04/28/2022 2:11 PM  Virtual Visit via Video Note   I, Gerald [name], connected with  Gerald [name]  ([id number], Sep 03, 1986) on 04/28/22 at  2:00 PM EDT by a video-enabled telemedicine application and verified that I am speaking with the correct person using two identifiers.  Location: Patient: Virtual Visit Location Patient:  Home Provider: Virtual Visit Location Provider: Home Office   I discussed the limitations of evaluation and management by telemedicine and the availability of in person appointments. The patient expressed understanding and agreed to proceed.    History of Present Illness: Gerald [name] is a 36 y.o. who identifies as a male who was assigned male at birth, and is being seen today for nasal and head congestion starting Friday night after having to blow leaves at home. That night noting also a sore throat which worsened overnight. Since then has had continued symptoms. Notes symptoms are improving but still present. Notes cough is persistent and productive of clear phlegm. Denies fever, chills, aches or SOB.   HPI: HPI  Problems:  Patient Active Problem List   Diagnosis Date Noted   COVID-19 10/20/2021   Difficulty concentrating 09/23/2021   Right upper quadrant abdominal mass 09/23/2021   Insomnia 07/16/2020   Epigastric abdominal pain 07/24/2019   BRBPR (bright red blood per rectum) 07/24/2019   Migraine 02/23/2019   Frequent headaches 02/27/2018   Preventative health care 07/19/2017   Mixed hyperlipidemia 07/19/2017   GERD (gastroesophageal reflux disease) 07/15/2017   Scrotal mass 08/26/2016   Morbid obesity with BMI of 40.0-44.9, adult (HCC) 12/04/2015   GAD (generalized anxiety disorder) 04/09/2015   Chest pain of uncertain etiology 01/08/2014   Hypersomnolence 12/02/2011   Fatigue 11/09/2011    Allergies:  Allergies  Allergen Reactions   Amoxicillin Hives    Has patient had a PCN reaction causing immediate rash, facial/tongue/throat  swelling, SOB or lightheadedness with hypotension: Yes Has patient had a PCN reaction causing severe rash involving mucus membranes or skin necrosis: Yes Has patient had a PCN reaction that required hospitalization No Has patient had a PCN reaction occurring within the last 10 years: No If all of the above answers are "NO", then may proceed with  Cephalosporin use.    Medications:  Current Outpatient Medications:    azelastine (ASTELIN) 0.1 % nasal spray, Place 1 spray into both nostrils 2 (two) times daily. Use in each nostril as directed, Disp: 30 mL, Rfl: 0   benzonatate (TESSALON) 100 MG capsule, Take 1 capsule (100 mg total) by mouth 3 (three) times daily as needed for cough., Disp: 30 capsule, Rfl: 0   buPROPion (WELLBUTRIN) 75 MG tablet, TAKE 1 TABLET(75 MG) BY MOUTH TWICE DAILY FOR FOCUS OR DEPRESSION, Disp: 180 tablet, Rfl: 0   hydrOXYzine (ATARAX/VISTARIL) 50 MG tablet, TAKE 0.5-1 TABLETS (25-50 MG TOTAL) BY MOUTH AT BEDTIME AS NEEDED. FOR SLEEP, Disp: 90 tablet, Rfl: 2   Melatonin 10 MG TABS, Take 10 mg by mouth., Disp: , Rfl:    naproxen sodium (ALEVE) 220 MG tablet, Take 220 mg by mouth daily as needed., Disp: , Rfl:    omeprazole (PRILOSEC) 20 MG capsule, TAKE 1 CAPSULE BY MOUTH EVERY DAY for heartburn., Disp: 90 capsule, Rfl: 3   rizatriptan (MAXALT) 5 MG tablet, Take 1 tablet by mouth at migraine onset. May repeat in 2 hours if needed., Disp: 10 tablet, Rfl: 0   venlafaxine XR (EFFEXOR-XR) 75 MG 24 hr capsule, Take 1 capsule (75 mg total) by mouth daily with breakfast. For anxiety and depression, Disp: 90 capsule, Rfl: 3  Observations/Objective: Patient is well-developed, well-nourished in no acute distress.  Resting comfortably at home.  Head is normocephalic, atraumatic.  No labored breathing. Speech is clear and coherent with logical content.  Patient is alert and oriented at baseline.   Assessment and Plan: 1. Acute viral sinusitis - benzonatate (TESSALON) 100 MG capsule; Take 1 capsule (100 mg total) by mouth 3 (three) times daily as needed for cough.  Dispense: 30 capsule; Refill: 0 - azelastine (ASTELIN) 0.1 % nasal spray; Place 1 spray into both nostrils 2 (two) times daily. Use in each nostril as directed  Dispense: 30 mL; Refill: 0  Rx Astelin spray and Tessalon.  Increase fluids.  Rest.  Saline nasal  spray.  Probiotic.  Mucinex as directed.  Humidifier in bedroom. OTC antihistamine daily.  Call or return to clinic if symptoms are not improving.   Follow Up Instructions: I discussed the assessment and treatment plan with the patient. The patient was provided an opportunity to ask questions and all were answered. The patient agreed with the plan and demonstrated an understanding of the instructions.  A copy of instructions were sent to the patient via MyChart unless otherwise noted below.    The patient was advised to call back or seek an in-person evaluation if the symptoms worsen or if the condition fails to improve as anticipated.  Time:  I spent 10 minutes with the patient via telehealth technology discussing the above problems/concerns.    Gerald Climes, PA-C

## 2022-04-28 NOTE — Patient Instructions (Addendum)
Gerald [name], thank you for joining Piedad Climes, PA-C for today's virtual visit.  While this provider is not your primary care provider (PCP), if your PCP is located in our provider database this encounter information will be shared with them immediately following your visit.  Consent: (Patient) Gerald [name] provided verbal consent for this virtual visit at the beginning of the encounter.  Current Medications:  Current Outpatient Medications:    buPROPion (WELLBUTRIN) 75 MG tablet, TAKE 1 TABLET(75 MG) BY MOUTH TWICE DAILY FOR FOCUS OR DEPRESSION, Disp: 180 tablet, Rfl: 0   hydrOXYzine (ATARAX/VISTARIL) 50 MG tablet, TAKE 0.5-1 TABLETS (25-50 MG TOTAL) BY MOUTH AT BEDTIME AS NEEDED. FOR SLEEP, Disp: 90 tablet, Rfl: 2   Melatonin 10 MG TABS, Take 10 mg by mouth., Disp: , Rfl:    naproxen sodium (ALEVE) 220 MG tablet, Take 220 mg by mouth daily as needed., Disp: , Rfl:    omeprazole (PRILOSEC) 20 MG capsule, TAKE 1 CAPSULE BY MOUTH EVERY DAY for heartburn., Disp: 90 capsule, Rfl: 3   rizatriptan (MAXALT) 5 MG tablet, Take 1 tablet by mouth at migraine onset. May repeat in 2 hours if needed., Disp: 10 tablet, Rfl: 0   venlafaxine XR (EFFEXOR-XR) 75 MG 24 hr capsule, Take 1 capsule (75 mg total) by mouth daily with breakfast. For anxiety and depression, Disp: 90 capsule, Rfl: 3   Medications ordered in this encounter:  No orders of the defined types were placed in this encounter.    *If you need refills on other medications prior to your next appointment, please contact your pharmacy*  Follow-Up: Call back or seek an in-person evaluation if the symptoms worsen or if the condition fails to improve as anticipated.  Other Instructions E-Visit for Sinus Problems  We are sorry that you are not feeling well.  Here is how we plan to help!  Based on what you have shared with me it looks like you have sinusitis.  Sinusitis is inflammation and infection in the sinus cavities of the head.   Based on your presentation I believe you most likely have Acute Viral Sinusitis.This is an infection most likely caused by a virus. There is not specific treatment for viral sinusitis other than to help you with the symptoms until the infection runs its course.  You may use an oral decongestant such as Mucinex D or if you have glaucoma or high blood pressure use plain Mucinex. Saline nasal spray help and can safely be used as often as needed for congestion, I have prescribed: Azelastine nasal spray 2 sprays in each nostril twice a day. Use the Tessalon as directed for cough. I also recommend OTC antihistamine daily like Claritin or Xyzal.  Some authorities believe that zinc sprays or the use of Echinacea may shorten the course of your symptoms.  Sinus infections are not as easily transmitted as other respiratory infection, however we still recommend that you avoid close contact with loved ones, especially the very young and elderly.  Remember to wash your hands thoroughly throughout the day as this is the number one way to prevent the spread of infection!  Home Care: Only take medications as instructed by your medical team. Do not take these medications with alcohol. A steam or ultrasonic humidifier can help congestion.  You can place a towel over your head and breathe in the steam from hot water coming from a faucet. Avoid close contacts especially the very young and the elderly. Cover your mouth when you cough or  sneeze. Always remember to wash your hands.  Get Help Right Away If: You develop worsening fever or sinus pain. You develop a severe head ache or visual changes. Your symptoms persist after you have completed your treatment plan.  Make sure you Understand these instructions. Will watch your condition. Will get help right away if you are not doing well or get worse.   Thank you for choosing an e-visit.  Your e-visit answers were reviewed by a board certified advanced clinical  practitioner to complete your personal care plan. Depending upon the condition, your plan could have included both over the counter or prescription medications.  Please review your pharmacy choice. Make sure the pharmacy is open so you can pick up prescription now. If there is a problem, you may contact your provider through [employer] and have the prescription routed to another pharmacy.  Your safety is important to [place]. If you have drug allergies check your prescription carefully.   For the next 24 hours you can use MyChart to ask questions about today's visit, request a non-urgent call back, or ask for a work or school excuse. You will get an email in the next two days asking about your experience. I hope that your e-visit has been valuable and will speed your recovery.     If you have been instructed to have an in-person evaluation today at a local Urgent Care facility, please use the link below. It will take you to a list of all of our available [facility], including address, phone number and hours of operation. Please do not delay care.  [facility]  If you or a family member do not have a primary care provider, use the link below to schedule a visit and establish care. When you choose a [facility] primary care physician or advanced practice provider, you gain a long-term partner in health. Find a Primary Care Provider  Learn more about [facility]'s in-office and virtual care options: [facility] - Get Care Now

## 2022-05-16 ENCOUNTER — Other Ambulatory Visit: Payer: Self-pay | Admitting: Primary Care

## 2022-05-16 DIAGNOSIS — F411 Generalized anxiety disorder: Secondary | ICD-10-CM

## 2022-06-18 ENCOUNTER — Ambulatory Visit: Payer: No Typology Code available for payment source | Admitting: Family Medicine

## 2022-06-18 ENCOUNTER — Encounter: Payer: Self-pay | Admitting: Family Medicine

## 2022-06-18 VITALS — BP 144/86 | HR 93 | Temp 97.0°F | Ht 71.0 in | Wt 308.1 lb

## 2022-06-18 DIAGNOSIS — K469 Unspecified abdominal hernia without obstruction or gangrene: Secondary | ICD-10-CM | POA: Diagnosis not present

## 2022-06-18 DIAGNOSIS — R41840 Attention and concentration deficit: Secondary | ICD-10-CM | POA: Diagnosis not present

## 2022-06-18 DIAGNOSIS — R030 Elevated blood-pressure reading, without diagnosis of hypertension: Secondary | ICD-10-CM

## 2022-06-18 LAB — CBC WITH DIFFERENTIAL/PLATELET
Basophils Absolute: 0.1 10*3/uL (ref 0.0–0.1)
Basophils Relative: 0.7 % (ref 0.0–3.0)
Eosinophils Absolute: 0.1 10*3/uL (ref 0.0–0.7)
Eosinophils Relative: 1.2 % (ref 0.0–5.0)
HCT: 45.1 % (ref 39.0–52.0)
Hemoglobin: 15 g/dL (ref 13.0–17.0)
Lymphocytes Relative: 27.2 % (ref 12.0–46.0)
Lymphs Abs: 3 10*3/uL (ref 0.7–4.0)
MCHC: 33.3 g/dL (ref 30.0–36.0)
MCV: 80.8 fl (ref 78.0–100.0)
Monocytes Absolute: 0.5 10*3/uL (ref 0.1–1.0)
Monocytes Relative: 4.7 % (ref 3.0–12.0)
Neutro Abs: 7.3 10*3/uL (ref 1.4–7.7)
Neutrophils Relative %: 66.2 % (ref 43.0–77.0)
Platelets: 311 10*3/uL (ref 150.0–400.0)
RBC: 5.58 Mil/uL (ref 4.22–5.81)
RDW: 15.1 % (ref 11.5–15.5)
WBC: 11.1 10*3/uL — ABNORMAL HIGH (ref 4.0–10.5)

## 2022-06-18 LAB — BASIC METABOLIC PANEL
BUN: 10 mg/dL (ref 6–23)
CO2: 30 mEq/L (ref 19–32)
Calcium: 9.4 mg/dL (ref 8.4–10.5)
Chloride: 102 mEq/L (ref 96–112)
Creatinine, Ser: 1 mg/dL (ref 0.40–1.50)
GFR: 95.98 mL/min (ref >60.00)
Glucose, Bld: 90 mg/dL (ref 70–99)
Potassium: 4 mEq/L (ref 3.5–5.1)
Sodium: 141 mEq/L (ref 135–145)

## 2022-06-18 LAB — TSH: TSH: 2.61 u[IU]/mL (ref 0.35–5.50)

## 2022-06-18 NOTE — Patient Instructions (Addendum)
Hernia more likely than lipoma.  We can set you up with general surgery if needed.  Take care.  Glad to see you. Check your BP out of clinic.  Update me if persistently >140/>90.  We'll call about seeing psychology for testing.  If you can't get seen soon then let me know.  Go to the lab on the way out.   If you have mychart we'll likely use that to update you.

## 2022-06-18 NOTE — Progress Notes (Unsigned)
BP elevated at OV.  Discussed with patient about rechecking blood pressure at home.  Blood pressure was slightly improved but still above goal on recheck.  Question of hernia at GB incision site.  Not painful.  Occ bulge noted.  Still having bowel movements.  No concern for obstruction.  See exam.  Managing multiple restaurants.  Stressors d/w pt.  Attention d/w pt.  Likely had difficulty with concentration at baseline but worse in the last few months.  He travelled to [place] but booked the wrong dates at the hotel.  He came in for the wrong date on his appointment.  Those errors are happening more often.  He seemed to be able to overcome this before but is having more troubles now.  H/o mult speeding tickets.  Takes caffeine daily.  H/o disorganized room in childhood and still having trouble with organization now.  Brother had h/o ADD, niece and daughter also have ADD.  Even in childhood, patient thought he had ADD.  He never had testing, partly because he always had good grades.    Meds, vitals, and allergies reviewed.   ROS: Per HPI unless specifically indicated in ROS section   GEN: nad, alert and oriented HEENT: ncat NECK: supple w/o LA CV: rrr. PULM: ctab, no inc wob ABD: soft, +bs EXT: no edema SKIN: no acute rash Hernia more likely than lipoma at incisional site on the upper abdomen.  Not tender.  No hard mass  30 minutes were devoted to patient care in this encounter (this includes time spent reviewing the patient's file/history, interviewing and examining the patient, counseling/reviewing plan with patient).

## 2022-06-20 DIAGNOSIS — R030 Elevated blood-pressure reading, without diagnosis of hypertension: Secondary | ICD-10-CM | POA: Insufficient documentation

## 2022-06-20 DIAGNOSIS — K469 Unspecified abdominal hernia without obstruction or gangrene: Secondary | ICD-10-CM | POA: Insufficient documentation

## 2022-06-20 DIAGNOSIS — R41840 Attention and concentration deficit: Secondary | ICD-10-CM | POA: Insufficient documentation

## 2022-06-20 DIAGNOSIS — I10 Essential (primary) hypertension: Secondary | ICD-10-CM | POA: Insufficient documentation

## 2022-06-20 NOTE — Assessment & Plan Note (Signed)
Continue work on diet and exercise and I want him to recheck his blood pressure at home.  He can update me if persistently elevated.

## 2022-06-20 NOTE — Assessment & Plan Note (Signed)
I think he likely has an abdominal wall hernia that is small and relatively asymptomatic.  He does not have significant pain and he has no symptoms of bowel obstruction.  We can refer him to general surgery if this is enlarging or painful.  No sign of an ominous diagnosis.  Discussed.

## 2022-06-20 NOTE — Assessment & Plan Note (Signed)
Discussed options.  I think it is likely that he has ADD but it makes sense to get testing done.  Check routine labs today.  See notes and labs.  No change in medications at this point.  Okay for outpatient follow-up.  He agrees with plan.  If he is going to have a significant delay in testing that he can update me.

## 2022-06-22 ENCOUNTER — Telehealth: Payer: Self-pay | Admitting: Primary Care

## 2022-06-22 NOTE — Telephone Encounter (Signed)
Patient called in and stated that he has tried to send Dr. Para March messages via MyChart. He stated that he had a referral to another doctor and the appointment is for 3 months at minimum. He stated that something could be prescribed to him if there was a wait to be seen. Please advise. Thank you!

## 2022-06-23 MED ORDER — AMPHETAMINE-DEXTROAMPHETAMINE 5 MG PO TABS: ORAL_TABLET | ORAL | 0 refills | Status: DC

## 2022-06-23 NOTE — Telephone Encounter (Signed)
I got the messages.  I am trying to respond to messages as quickly as I can.  I needed to consider options in the meantime and there is the additional consideration about his blood pressure.  I am glad to know his blood pressure did not increase over the weekend.  I still think it makes sense to get the testing done but given his presumptive diagnosis I would try taking Adderall 5 mg twice a day.  I sent the prescription.  If he has tremor, decrease in appetite, insomnia, or other troubles then let me know.  If he has blood pressure elevation on the medication then let me know.  I would not go above 5 mg twice a day initially.  Please have him update me in about 1 week after he starts the medication.  Thanks.

## 2022-06-23 NOTE — Telephone Encounter (Signed)
Called and discussed below with patient. Patient verbalized understanding of instructions and was thankful for this. Patient will update [place] in 1 week after starting medication.

## 2022-06-23 NOTE — Addendum Note (Signed)
Addended by: Joaquim Nam on: 06/23/2022 02:05 PM   Modules accepted: Orders

## 2022-06-25 ENCOUNTER — Encounter: Payer: Self-pay | Admitting: Family Medicine

## 2022-06-25 ENCOUNTER — Telehealth (INDEPENDENT_AMBULATORY_CARE_PROVIDER_SITE_OTHER): Payer: No Typology Code available for payment source | Admitting: Family Medicine

## 2022-06-25 DIAGNOSIS — R41840 Attention and concentration deficit: Secondary | ICD-10-CM | POA: Diagnosis not present

## 2022-06-25 MED ORDER — AMPHETAMINE-DEXTROAMPHETAMINE 5 MG PO TABS: ORAL_TABLET | ORAL | Status: DC

## 2022-06-25 NOTE — Progress Notes (Unsigned)
Virtual visit completed through WebEx or similar program Patient location: home  Provider location: [place] at [facility], office  Participants: Patient and me (unless stated otherwise below)  Pandemic considerations d/w pt.   Limitations and rationale for visit method d/w patient.  Patient agreed to proceed.   CC: concentration follow up.    HPI: He isn't going to be able to get testing soon.  D/w pt about getting that done when possible.  He started adderall in the meantime.   No ADE on adderall 5mg  BID.  His concentration is better on med.  His information retention is better.  No tremor or insomnia with med.  Still using melatonin at baseline.  BP is still okay, he has checked it out of clinic.  Pulse isn't increased from baseline.  He notes onset of med effect but that isn't distressing.  D/w pt about dosing options.  He could inc to 10mg  BID if needed.    Meds and allergies reviewed.   ROS: Per HPI unless specifically indicated in ROS section   NAD Speech wnl  A/P: Concentration difficulty, presumed ADD. He isn't going to be able to get testing soon.  D/w pt about getting that done when possible.  He started adderall in the meantime.  It is clearly helped.  He can continue Adderall 5 mg twice a day for now.  If needed he can increase to 5-10 mg twice a day with routine cautions discussed with patient.  I can refill his next prescription if needed since his primary has not seen him for this issue recently. I will update PCP as FYI.

## 2022-06-29 NOTE — Assessment & Plan Note (Signed)
  He isn't going to be able to get testing soon.  D/w pt about getting that done when possible.  He started adderall in the meantime.  It is clearly helped.  He can continue Adderall 5 mg twice a day for now.  If needed he can increase to 5-10 mg twice a day with routine cautions discussed with patient.  I can refill his next prescription if needed since his primary has not seen him for this issue recently. I will update PCP as FYI.

## 2022-06-30 ENCOUNTER — Ambulatory Visit: Payer: No Typology Code available for payment source | Admitting: Primary Care

## 2022-07-16 ENCOUNTER — Ambulatory Visit: Payer: No Typology Code available for payment source | Admitting: Primary Care

## 2022-07-16 ENCOUNTER — Encounter: Payer: Self-pay | Admitting: Primary Care

## 2022-07-16 DIAGNOSIS — R41840 Attention and concentration deficit: Secondary | ICD-10-CM

## 2022-07-16 DIAGNOSIS — K469 Unspecified abdominal hernia without obstruction or gangrene: Secondary | ICD-10-CM

## 2022-07-16 DIAGNOSIS — R030 Elevated blood-pressure reading, without diagnosis of hypertension: Secondary | ICD-10-CM

## 2022-07-16 NOTE — Progress Notes (Signed)
Subjective:    Patient ID: Gerald [name], male    DOB: [birth year], 36 y.o.   MRN: [id number]  HPI  Gerald [name] is a very pleasant 36 y.o. male with a history of migraines, GERD, rectal bleeding, fatigue, GAD, hyperlipidemia, morbid obesity, insomnia, difficulty concentrating who presents today to discuss several concerns.  1) Difficulty Concentrating: Evaluated by Dr. Para March on 06/18/22 per Dr. Para March for multiple concerns including Difficulty concentrating, increased stress, trouble with attention. 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 Family history of ADD in his brother, niece, and daughter. He had never undergone ADHD/ADD testing.   During this visit it was recommended he undergo formal ADD/ADHD testing.  Labs were checked as well.  He called 2 days later reporting that his appointment was scheduled for around December and was requesting treatment.  Dr. Para March sent a prescription for Adderall 5 mg twice daily.  His blood pressure was noted to be elevated that day.  Evaluated again by Dr. Para March on 06/29/2022 for follow-up since initiation of Adderall.  During this visit he mentioned improvement in his symptoms.  Recommendation was to increase Adderall to 5 to 10 mg twice daily.  Since his last visit he is taking Adderall 5 mg BID for the most part. He took 10 mg of Adderall yesterday, felt more focused. Overall he has noticed improvement in staying on task, improved concentration, feeling more focused.   His appointment is scheduled for January 2024. He is not checking his BP at home.   BP Readings from Last 3 Encounters:  07/16/22 (!) 164/98  06/18/22 (!) 144/86  09/23/21 124/78   Wt Readings from Last 3 Encounters:  07/16/22 (!) 310 lb (140.6 kg)  06/25/22 (!) 308 lb (139.7 kg)  06/18/22 (!) 308 lb 2 oz (139.8 kg)     2) Abdominal Pain: Evaluated by Dr. Para March on 06/18/22 for painless, occasional bulge to abdomen at  cholecystectomy site.  During this visit was suspected he may have an abdominal wall hernia that was small and asymptomatic.  Recommendation was for watchful waiting as he was asymptomatic.  He continues to notice the mass to the right upper abdomen. The mass has become more of a discomfort over the last two weeks, especially with palpation. A few weeks ago he noticed pain without palpation.   He denies frequent heavy lifting, denies pain during heavy lifting.   Review of Systems  Respiratory:  Negative for shortness of breath.   Cardiovascular:  Negative for chest pain and palpitations.  Neurological:  Negative for headaches.  Psychiatric/Behavioral:  The patient is nervous/anxious.        Improved concentration, see HPI         Past Medical History:  Diagnosis Date   Anxiety    Complication of anesthesia    Difficult intubation    Headache    migraiones   Lyme disease    Viral URI with cough 06/07/2013    Social History   Socioeconomic History   Marital status: Married    Spouse name: Not on file   Number of children: Not on file   Years of education: Not on file   Highest education level: Not on file  Occupational History   Occupation: Event organiser: Production manager  Tobacco Use   Smoking status: Never   Smokeless tobacco: Never  Substance and Sexual Activity   Alcohol use: Yes    Comment: OCCASIONAL   Drug use:  No   Sexual activity: Not on file  Other Topics Concern   Not on file  Social History Narrative   Agricultural consultant for [employer]   Married 2 kids, 3rd child died of hypoplastic L heart 01/25/2012   Social Determinants of Health   Financial Resource Strain: Not on file  Food Insecurity: Not on file  Transportation Needs: Not on file  Physical Activity: Not on file  Stress: Not on file  Social Connections: Not on file  Intimate Partner Violence: Not on file    Past Surgical History:  Procedure Laterality Date   CHOLECYSTECTOMY N/A 04/08/2016    Procedure: LAPAROSCOPIC CHOLECYSTECTOMY WITH INTRAOPERATIVE CHOLANGIOGRAM;  Surgeon: Ricarda Frame, MD;  Location: [facility] ORS;  Service: General;  Laterality: N/A;   TONSILLECTOMY AND ADENOIDECTOMY  ~1991    Family History  Problem Relation Age of Onset   Drug abuse Mother    Hypertension Mother    Mental illness Mother    Diabetes Father    Bipolar disorder Sister    Depression Brother     Allergies  Allergen Reactions   Amoxicillin Hives    Has patient had a PCN reaction causing immediate rash, facial/tongue/throat swelling, SOB or lightheadedness with hypotension: Yes Has patient had a PCN reaction causing severe rash involving mucus membranes or skin necrosis: Yes Has patient had a PCN reaction that required hospitalization No Has patient had a PCN reaction occurring within the last 10 years: No If all of the above answers are "NO", then may proceed with Cephalosporin use.     Current Outpatient Medications on File Prior to Visit  Medication Sig Dispense Refill   amphetamine-dextroamphetamine (ADDERALL) 5 MG tablet Take 5-10mg  in the morning and 5-10mg  after lunch.     Melatonin 10 MG TABS Take 10 mg by mouth.     naproxen sodium (ALEVE) 220 MG tablet Take 220 mg by mouth daily as needed.     omeprazole (PRILOSEC) 20 MG capsule TAKE 1 CAPSULE BY MOUTH EVERY DAY for heartburn. 90 capsule 3   venlafaxine XR (EFFEXOR-XR) 75 MG 24 hr capsule TAKE ONE CAPSULE BY MOUTH DAILY WITH BREAKFAST FOR ANXIETY AND DEPRESSION 90 capsule 0   No current facility-administered medications on file prior to visit.    BP (!) 164/98   Pulse 96   Temp (!) 97.3 F (36.3 C) (Temporal)   Ht 5\' 11"  (1.803 m)   Wt (!) 310 lb (140.6 kg)   SpO2 100%   BMI 43.24 kg/m  Objective:   Physical Exam Cardiovascular:     Rate and Rhythm: Normal rate and regular rhythm.  Pulmonary:     Effort: Pulmonary effort is normal.     Breath sounds: Normal breath sounds. No wheezing or rales.  Abdominal:      Comments: Baseball sized, soft mass to right upper abdomen. Tender on palpation.   Musculoskeletal:     Cervical back: Neck supple.  Skin:    General: Skin is warm and dry.  Neurological:     Mental Status: He is alert and oriented to person, place, and time.           Assessment & Plan:   Problem List Items Addressed This Visit       Other   Difficulty concentrating    Improved with initiation of Adderall. His symptoms are certainly suggestive of ADHD even though he has not had formal testing. Strong FH of ADD.  Long discussion today regarding the absolute need for a  formal diagnosis from testing. He will undergo testing in January 2024. We discussed that I do not initiate treatment unless we have records.   Unfortunately, his BP has increased since his last visit, likely from stimulant use. I recommended discontinuation of stimulant treatment, start checking BP at home for the next 1-2 weeks, update via MyChart with readings. It's possible that we may need to treat his BP, will await home readings.   I will check with our behavorial health provider to see if she completes testing. Perhaps we can get him in sooner.   Consider non stimulant treatment. Dr. Damita Dunnings has agreed to refill Adderall.      Elevated BP without diagnosis of hypertension    Increased since last visit, likely secondary to stimulant medication.  Stop Adderall.  Start checking BP at home. Follow up in 2 weeks for BP check.  Consider anti-hypertensive treatment.       Abdominal hernia    Very likely post incisional hernia, especially given exam and HPI. Given his symptoms of discomfort we will refer him to general surgery for further discussion.   Referral placed.       Relevant Orders   Ambulatory referral to [place], NP

## 2022-07-16 NOTE — Patient Instructions (Addendum)
Start monitoring your blood pressure daily, around the same time of day, for the next 2 weeks.  Ensure that you have rested for 30 minutes prior to checking your blood pressure.   Stop Adderall for now.  Please schedule a follow up visit to meet back with me in 2-3 weeks for blood pressure check.   It was a pleasure to see you today!

## 2022-07-16 NOTE — Assessment & Plan Note (Signed)
Increased since last visit, likely secondary to stimulant medication.  Stop Adderall.  Start checking BP at home. Follow up in 2 weeks for BP check.  Consider anti-hypertensive treatment.

## 2022-07-16 NOTE — Assessment & Plan Note (Signed)
Very likely post incisional hernia, especially given exam and HPI. Given his symptoms of discomfort we will refer him to general surgery for further discussion.   Referral placed.

## 2022-07-16 NOTE — Assessment & Plan Note (Addendum)
Improved with initiation of Adderall. His symptoms are certainly suggestive of ADHD even though he has not had formal testing. Strong FH of ADD.  Long discussion today regarding the absolute need for a formal diagnosis from testing. He will undergo testing in January 2024. We discussed that I do not initiate treatment unless we have records.   Unfortunately, his BP has increased since his last visit, likely from stimulant use. I recommended discontinuation of stimulant treatment, start checking BP at home for the next 1-2 weeks, update via MyChart with readings. It's possible that we may need to treat his BP, will await home readings.   I will check with our behavorial health provider to see if she completes testing. Perhaps we can get him in sooner.   Consider non stimulant treatment. Dr. Damita Dunnings has agreed to refill Adderall.

## 2022-07-25 ENCOUNTER — Other Ambulatory Visit: Payer: Self-pay | Admitting: Primary Care

## 2022-07-25 DIAGNOSIS — F411 Generalized anxiety disorder: Secondary | ICD-10-CM

## 2022-07-30 ENCOUNTER — Ambulatory Visit: Payer: No Typology Code available for payment source | Admitting: Primary Care

## 2022-07-30 ENCOUNTER — Encounter: Payer: Self-pay | Admitting: Primary Care

## 2022-07-30 ENCOUNTER — Other Ambulatory Visit: Payer: Self-pay | Admitting: Primary Care

## 2022-07-30 VITALS — BP 162/98 | HR 107 | Temp 97.3°F | Ht 71.0 in | Wt 312.0 lb

## 2022-07-30 DIAGNOSIS — R41840 Attention and concentration deficit: Secondary | ICD-10-CM

## 2022-07-30 DIAGNOSIS — I10 Essential (primary) hypertension: Secondary | ICD-10-CM | POA: Diagnosis not present

## 2022-07-30 MED ORDER — LOSARTAN POTASSIUM 50 MG PO TABS: 50.0000 mg | ORAL_TABLET | Freq: Every day | ORAL | 0 refills | Status: DC

## 2022-07-30 NOTE — Assessment & Plan Note (Signed)
Symptoms progressed since off Adderall.   Will work to treat hypertension as Adderall doesn't seem to be causing. Discussed to resume Adderall once BP controlled.  Referral placed for ADHD testing through a Center in [place] per patient request.

## 2022-07-30 NOTE — Assessment & Plan Note (Signed)
Above goal today, also during prior visits and home readings.  Start losartan 50 mg daily. Strongly advised to work on weight loss, improving diet, exercising.  We will plan to see him back in 2-3 weeks for BP check and BMP.

## 2022-07-30 NOTE — Progress Notes (Signed)
Subjective:    Patient ID: Gerald [name], male    DOB: 02/08/1986, 36 y.o.   MRN: [id number]  HPI  Omeir Zender is a very pleasant 36 y.o. male with a history of difficulty concentrating, migraines, GAD, hyperlipidemia, frequent headaches insomnia, elevated BP who presents today for follow up of elevated BP.  He was last evaluated on 07/16/22 for follow up of difficulty concentrating since initiation of Adderall 5 mg BID. During this visit he had improved on Adderall 5 mg BID, felt better on 10 mg BID, however his BP was noted to be quite high at 164/98. Given this reading and the elevated reading prior, he was advised to stop taking Adderall, start monitoring BP at home, and follow up today.  Since his last visit he's checked his BP a few times which is running in the 190's/110's. He has discontinued his Adderall. He does notice fatigue. He has noticed increased headaches over the last year.   Since being off Addreall he's noticed a decline in focus, mood swings, irritability, and feeling less motivated to do certain chores in his home. He has yet to hear from behavorial health regarding ADHD testing.   BP Readings from Last 3 Encounters:  07/30/22 (!) 162/98  07/16/22 (!) 164/98  06/18/22 (!) 144/86   Wt Readings from Last 3 Encounters:  07/30/22 (!) 312 lb (141.5 kg)  07/16/22 (!) 310 lb (140.6 kg)  06/25/22 (!) 308 lb (139.7 kg)      Review of Systems  Constitutional:  Positive for fatigue.  Respiratory:  Negative for shortness of breath.   Cardiovascular:  Negative for chest pain.  Neurological:  Positive for headaches.  Psychiatric/Behavioral:  Positive for decreased concentration. The patient is nervous/anxious.          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  Tobacco Use   Smoking status: Never   Smokeless tobacco: Never  Substance and Sexual Activity   Alcohol use: Yes    Comment: OCCASIONAL   Drug use: No   Sexual activity: Not on file  Other Topics Concern   Not on file  [place]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 [employer]   Married 2 kids, 3rd child died of hypoplastic L heart 02-05-12   Social Determinants of Health   Financial Resource Strain: Not on file  Food Insecurity: Not on file  Transportation Needs: Not on file  Physical Activity: Not on file  Stress: Not on file  Social Connections: Not on file  Intimate Partner Violence: Not on file    Past Surgical History:  Procedure Laterality Date   CHOLECYSTECTOMY N/A 04/08/2016   Procedure: LAPAROSCOPIC CHOLECYSTECTOMY WITH INTRAOPERATIVE CHOLANGIOGRAM;  Surgeon: Clayburn Pert, MD;  Location: [facility] ORS;  Service: General;  Laterality: N/A;   TONSILLECTOMY AND ADENOIDECTOMY  February 04, 1990    Family History  Problem Relation Age of Onset   Drug abuse Mother    Hypertension Mother    Mental illness  Mother    Diabetes Father    Bipolar disorder Sister    Depression Brother     Allergies  Allergen Reactions   Amoxicillin Hives    Has patient had a PCN reaction causing immediate rash, facial/tongue/throat swelling, SOB or lightheadedness with hypotension: Yes Has patient had a PCN reaction causing severe rash involving mucus membranes or skin necrosis: Yes Has patient had a PCN reaction that required hospitalization No Has patient had a PCN reaction occurring within the last 10 years: No If all of the above answers are "NO", then may proceed with Cephalosporin use.     Current Outpatient Medications on File Prior to Visit  Medication Sig Dispense Refill    Melatonin 10 MG TABS Take 10 mg by mouth.     naproxen sodium (ALEVE) 220 MG tablet Take 220 mg by mouth daily as needed.     omeprazole (PRILOSEC) 20 MG capsule TAKE 1 CAPSULE BY MOUTH EVERY DAY for heartburn. 90 capsule 3   venlafaxine XR (EFFEXOR-XR) 75 MG 24 hr capsule TAKE 1 CAPSULE BY MOUTH DAILY WITH BREAKFAST FOR ANXIETY OR DEPRESSION 90 capsule 0   amphetamine-dextroamphetamine (ADDERALL) 5 MG tablet Take 5-10mg  in the morning and 5-10mg  after lunch. (Patient not taking: Reported on 07/30/2022)     No current facility-administered medications on file prior to visit.    BP (!) 162/98   Pulse (!) 107   Temp (!) 97.3 F (36.3 C) (Temporal)   Ht 5\' 11"  (1.803 m)   Wt (!) 312 lb (141.5 kg)   SpO2 94%   BMI 43.52 kg/m  Objective:   Physical Exam Cardiovascular:     Rate and Rhythm: Normal rate and regular rhythm.  Pulmonary:     Effort: Pulmonary effort is normal.     Breath sounds: Normal breath sounds. No wheezing or rales.  Musculoskeletal:     Cervical back: Neck supple.  Skin:    General: Skin is warm and dry.  Neurological:     Mental Status: He is alert and oriented to person, place, and time.           Assessment & Plan:   Problem List Items Addressed This Visit       Cardiovascular and Mediastinum   Essential hypertension - Primary    Above goal today, also during prior visits and home readings.  Start losartan 50 mg daily. Strongly advised to work on weight loss, improving diet, exercising.  We will plan to see him back in 2-3 weeks for BP check and BMP.       Relevant Medications   losartan (COZAAR) 50 MG tablet     Other   Difficulty concentrating    Symptoms progressed since off Adderall.   Will work to treat hypertension as Adderall doesn't seem to be causing. Discussed to resume Adderall once BP controlled.  Referral placed for ADHD testing through a Center in [place] per patient request.         Relevant Orders   Ambulatory  referral to Psychology       Pleas Koch, NP

## 2022-08-03 ENCOUNTER — Encounter: Payer: Self-pay | Admitting: *Deleted

## 2022-08-04 ENCOUNTER — Other Ambulatory Visit: Payer: Self-pay

## 2022-08-04 ENCOUNTER — Encounter: Payer: Self-pay | Admitting: Surgery

## 2022-08-04 ENCOUNTER — Ambulatory Visit: Payer: No Typology Code available for payment source | Admitting: Surgery

## 2022-08-04 VITALS — BP 157/111 | HR 101 | Temp 98.8°F | Ht 71.0 in | Wt 309.0 lb

## 2022-08-04 DIAGNOSIS — K432 Incisional hernia without obstruction or gangrene: Secondary | ICD-10-CM

## 2022-08-04 NOTE — Progress Notes (Signed)
08/04/2022  Reason for Visit:  Abdominal wall hernia  Requesting Provider:  Alma Friendly, NP  History of Present Illness: Gerald [name] is a 36 y.o. male presenting for evaluation of an abdominal wall hernia.  The patient has a history of laparoscopic cholecystectomy with Dr. Adonis Huguenin in June 2017.  The patient reports that soon after surgery he felt an area in the epigastric region balloon up.  The patient reports that after surgery he did not have much pain at all and started doing clean up and other activities around the house right after.  Over the years, the area of bulging has become bigger and now he has some intermittent discomfort/soreness in the area of bulging.  The bulge can be pushed back in and can also go in on its own.  Reports sometimes after eating he can feel the area bulging more.  Denies any episodes of severe pain, constipation, nausea, vomiting.  He does feel that his bowel never adjusted after cholecystectomy and has loose stools and also feels his heartburn worsened.  Past Medical History: Past Medical History:  Diagnosis Date   Anxiety    Complication of anesthesia    Difficult intubation    Headache    migraiones   Lyme disease    Viral URI with cough 06/07/2013     Past Surgical History: Past Surgical History:  Procedure Laterality Date   CHOLECYSTECTOMY N/A 04/08/2016   Procedure: LAPAROSCOPIC CHOLECYSTECTOMY WITH INTRAOPERATIVE CHOLANGIOGRAM;  Surgeon: Clayburn Pert, MD;  Location: [facility] ORS;  Service: General;  Laterality: N/A;   TONSILLECTOMY AND ADENOIDECTOMY  ~1991    Home Medications: Prior to Admission medications   Medication Sig Start Date End Date Taking? Authorizing Provider  amphetamine-dextroamphetamine (ADDERALL) 5 MG tablet Take 5-10mg  in the morning and 5-10mg  after lunch. 06/25/22  Yes Tonia Ghent, MD  losartan (COZAAR) 50 MG tablet Take 1 tablet (50 mg total) by mouth daily. for blood pressure. 07/30/22  Yes Pleas Koch, NP   Melatonin 10 MG TABS Take 10 mg by mouth.   Yes [provider]  naproxen sodium (ALEVE) 220 MG tablet Take 220 mg by mouth daily as needed.   Yes [provider]  omeprazole (PRILOSEC) 20 MG capsule TAKE 1 CAPSULE BY MOUTH EVERY DAY for heartburn. 04/22/21  Yes Pleas Koch, NP  venlafaxine XR (EFFEXOR-XR) 75 MG 24 hr capsule TAKE 1 CAPSULE BY MOUTH DAILY WITH BREAKFAST FOR ANXIETY OR DEPRESSION 07/25/22  Yes Pleas Koch, NP    Allergies: Allergies  Allergen Reactions   Amoxicillin Hives    Has patient had a PCN reaction causing immediate rash, facial/tongue/throat swelling, SOB or lightheadedness with hypotension: Yes Has patient had a PCN reaction causing severe rash involving mucus membranes or skin necrosis: Yes Has patient had a PCN reaction that required hospitalization No Has patient had a PCN reaction occurring within the last 10 years: No If all of the above answers are "NO", then may proceed with Cephalosporin use.     Social History:  reports that he has never smoked. He has never used smokeless tobacco. He reports current alcohol use. He reports that he does not use drugs.   Family History: Family History  Problem Relation Age of Onset   Drug abuse Mother    Hypertension Mother    Mental illness Mother    Diabetes Father    Bipolar disorder Sister    Depression Brother     Review of Systems: Review of Systems  Constitutional:  Negative for chills and fever.  Respiratory:  Negative for shortness of breath.   Cardiovascular:  Negative for chest pain.  Gastrointestinal:  Positive for abdominal pain, diarrhea and heartburn. Negative for nausea and vomiting.  Genitourinary:  Negative for dysuria.  Skin:  Negative for rash.    Physical Exam BP (!) 157/111   Pulse (!) 101   Temp 98.8 F (37.1 C) (Oral)   Ht 5\' 11"  (1.803 m)   Wt (!) 309 lb (140.2 kg)   SpO2 97%   BMI 43.10 kg/m  CONSTITUTIONAL: No acute distress HEENT:   Normocephalic, atraumatic, extraocular motion intact. NECK: Trachea is midline, and there is no jugular venous distension.  RESPIRATORY:  Lungs are clear, and breath sounds are equal bilaterally. Normal respiratory effort without pathologic use of accessory muscles. CARDIOVASCULAR: Heart is regular without murmurs, gallops, or rubs. GI: The abdomen is soft, non-distended, non-tender.  The patient has an epigastric incisional hernia near the site of his epigastric incision.  I'm unable to discern full size given body habitus, but appears to be small, fat containing. MUSCULOSKELETAL:  Normal muscle strength and tone in all four extremities.  No peripheral edema or cyanosis. SKIN: Skin turgor is normal. There are no pathologic skin lesions.  NEUROLOGIC:  Motor and sensation is grossly normal.  Cranial nerves are grossly intact. PSYCH:  Alert and oriented to person, place and time. Affect is normal.  Laboratory Analysis: Labs from 06/18/22: Na 141, K 4, Cl 102, CO2 30, BUN 10, Cr 1.0.  WBC 11.1, Hgb 15, Hct 45.1, Plt 311  Imaging: No results found.  Assessment and Plan: This is a 36 y.o. male with an incisional epigastric hernia.  --Discussed with the patient that indeed he has an incisional hernia at the site of the epigastric incision.  The patient reports that this incision had to be extended in order to remove the gallbladder as the gallbladder had a very large stone.  And also given his increased activity level right after surgery, very reasonable that the area was weakened and developed into a hernia.  The patient has some symptoms from it and mostly soreness/discomfort.  No episodes of severe pain. --Discussed with him the options for surgical management, and given the symptoms, would recommend repair, though there is time flexibility given that the hernia is easily reducible and symptoms are mild.  He is currently debating what the best timing would be for surgery.  He reports that November is  very busy at work, and December is not ideal due to the holidays.  Discussed with him that there is time flexibility and we could potentially wait until next year to repair the hernia.  He is in agreement with this. --Patient will call January towards end of December to early January to set up a follow up appointment to schedule surgery.  Discussed with him briefly that we would do this as a robotic incisional hernia repair, the use of sutures to close the defect and reinforcement with mesh. --Follow up December/January to update chart and discuss surgery.  Return precautions given.  I spent 40 minutes dedicated to the care of this patient on the date of this encounter to include pre-visit review of records, face-to-face time with the patient discussing diagnosis and management, and any post-visit coordination of care.   [place], MD [facility]

## 2022-08-04 NOTE — Patient Instructions (Addendum)
Call us when you are ready to set up your surgery.  If you have any concerns or questions, please feel free to call our office.   Hernia, Adult     A hernia is the bulging of an organ or tissue through a weak spot in the muscles of the abdomen. Hernias develop most often near the belly button (navel) or the area where the leg meets the lower abdomen (groin). Common types of hernias include: Incisional hernia. This type bulges through a scar from an abdominal surgery. Umbilical hernia. This type develops near the navel. Inguinal hernia. This type develops in the groin or scrotum. Femoral hernia. This type develops below the groin, in the upper thigh area. Hiatal hernia. This type occurs when part of the stomach slides above the muscle that separates the abdomen from the chest (diaphragm). What are the causes? This condition may be caused by: Heavy lifting. Coughing over a long period of time. Straining to have a bowel movement. Constipation can lead to straining. An incision made during abdominal surgery. A physical problem that is present at birth (congenital defect). Being overweight or obese. Smoking. Excess fluid in the abdomen. Undescended testicles in males. What are the signs or symptoms? The main symptom is a skin-colored, rounded bulge in the area of the hernia. However, a bulge may not always be present. It may grow bigger or be more visible when you cough or strain (such as when lifting something heavy). A hernia that can be pushed back into the abdomen (is reducible) rarely causes pain. A hernia that cannot be pushed back into the abdomen (is incarcerated) may lose its blood supply (become strangulated). A hernia that is incarcerated may cause: Pain. Fever. Nausea and vomiting. Swelling. Constipation. How is this diagnosed? A hernia may be diagnosed based on: Your symptoms and medical history. A physical exam. Your health care provider may ask you to cough or move in  certain ways to see if the hernia becomes visible. Imaging tests, such as: X-rays. Ultrasound. CT scan. How is this treated? A hernia that is small and painless may not need to be treated. A hernia that is large or painful may be treated with surgery. Inguinal hernias may be treated with surgery to prevent incarceration or strangulation. Strangulated hernias are always treated with surgery because the strangulation causes a lack of blood supply to the trapped organ or tissue. Surgery to treat a hernia involves pushing the bulge back into place and repairing the weak area of the muscle or abdominal wall. Follow these instructions at home: Activity Avoid straining. Do not lift anything that is heavier than 10 lb (4.5 kg), or the limit that you are told, until your health care provider says that it is safe. When lifting heavy objects, lift with your leg muscles, not your back muscles. Preventing constipation Take actions to prevent constipation. Constipation leads to straining with bowel movements, which can make a hernia worse or cause a hernia repair to break down. Your health care provider may recommend that you take these actions to prevent or treat constipation: Drink enough fluid to keep your urine pale yellow. Take over-the-counter or prescription medicines. Eat foods that are high in fiber, such as beans, whole grains, and fresh fruits and vegetables. Limit foods that are high in fat and processed sugars, such as fried or sweet foods. General instructions When coughing, try to cough gently. You may try to push the hernia back in place by very gently pressing on it while  lying down. Do not try to force the bulge back in if it will not push in easily. If you are overweight, work with your health care provider to lose weight safely. Do not use any products that contain nicotine or tobacco. These products include cigarettes, chewing tobacco, and vaping devices, such as e-cigarettes. If you  need help quitting, ask your health care provider. If you are scheduled for hernia repair, watch your hernia for any changes in shape, size, or color. Tell your health care provider about any changes or new symptoms. Take over-the-counter and prescription medicines only as told by your health care provider. Keep all follow-up visits. This is important. Contact a health care provider if: You develop new pain, swelling, or redness around your hernia. You have signs of constipation, such as: Fewer bowel movements in a week than normal. Difficulty having a bowel movement. Stools that are dry, hard, or larger than normal. Get help right away if: You have a fever or chills. You have abdominal pain that gets worse. You feel nauseous or you vomit. You cannot push the hernia back in place by very gently pressing on it while lying down. Do not try to force the bulge back in if it will not go in easily. The hernia: Changes in shape, size, or color. Feels hard or tender. These symptoms may represent a serious problem that is an emergency. Do not wait to see if the symptoms will go away. Get medical help right away. Call your local emergency services (911 in the [place] Do not drive yourself to the hospital. Summary A hernia is the bulging of an organ or tissue through a weak spot in the muscles of the abdomen. The main symptom is a skin-colored bulge in the hernia area. However, a bulge may not always be present. It may grow bigger or more visible when you cough or strain (such as when having a bowel movement). A hernia that is small and painless may not need to be treated. A hernia that is large or painful may be treated with surgery. Surgery to treat a hernia involves pushing the bulge back into place and repairing the weak part of the abdomen. This information is not intended to replace advice given to you by your health care provider. Make sure you discuss any questions you have with your health care  provider. Document Revised: 05/05/2020 Document Reviewed: 05/05/2020 Elsevier Patient Education  2023 [employer].

## 2022-08-16 ENCOUNTER — Encounter: Payer: Self-pay | Admitting: Primary Care

## 2022-08-16 ENCOUNTER — Ambulatory Visit: Payer: No Typology Code available for payment source | Admitting: Primary Care

## 2022-08-16 ENCOUNTER — Telehealth: Payer: Self-pay | Admitting: Surgery

## 2022-08-16 VITALS — BP 158/86 | HR 100 | Temp 97.3°F | Ht 71.0 in | Wt 312.0 lb

## 2022-08-16 DIAGNOSIS — R519 Headache, unspecified: Secondary | ICD-10-CM

## 2022-08-16 DIAGNOSIS — I10 Essential (primary) hypertension: Secondary | ICD-10-CM

## 2022-08-16 DIAGNOSIS — K469 Unspecified abdominal hernia without obstruction or gangrene: Secondary | ICD-10-CM

## 2022-08-16 LAB — BASIC METABOLIC PANEL
BUN: 12 mg/dL (ref 6–23)
CO2: 30 mEq/L (ref 19–32)
Calcium: 9.3 mg/dL (ref 8.4–10.5)
Chloride: 102 mEq/L (ref 96–112)
Creatinine, Ser: 0.93 mg/dL (ref 0.40–1.50)
GFR: 104.6 mL/min (ref >60.00)
Glucose, Bld: 157 mg/dL — ABNORMAL HIGH (ref 70–99)
Potassium: 3.5 mEq/L (ref 3.5–5.1)
Sodium: 140 mEq/L (ref 135–145)

## 2022-08-16 MED ORDER — LOSARTAN POTASSIUM-HCTZ 50-12.5 MG PO TABS: 1.0000 | ORAL_TABLET | Freq: Every day | ORAL | 0 refills | Status: DC

## 2022-08-16 NOTE — Assessment & Plan Note (Signed)
Following with general surgery, office notes reviewed from October 2023.

## 2022-08-16 NOTE — Patient Instructions (Signed)
Stop by the lab prior to leaving today. I will notify you of your results once received.   Stop losartan 50 mg for blood pressure.  Start losartan-hydrochlorothiazide 50-12.5 mg daily for blood pressure.  Please schedule a follow up visit to meet back with me in 2-3 weeks for blood pressure check.   It was a pleasure to see you today!

## 2022-08-16 NOTE — Assessment & Plan Note (Signed)
Improved but not at goal.  Continue losartan 50 mg daily, add HCTZ 12.5 mg daily. Rx for losartan-HCTZ 50-12.5 mg sent to pharmacy.  BMP pending.

## 2022-08-16 NOTE — Assessment & Plan Note (Signed)
Improving with better BP control. Continue to monitor.

## 2022-08-16 NOTE — Telephone Encounter (Signed)
Called patient back about scheduling surgery, unable to leave a message, voice mailbox was full.

## 2022-08-16 NOTE — Progress Notes (Signed)
Subjective:    Patient ID: Gerald [name], male    DOB: 04-19-1986, 36 y.o.   MRN: [id number]  Hypertension Associated symptoms include headaches. Pertinent negatives include no chest pain or shortness of breath.    Gerald [name] is a very pleasant 36 y.o. male with a history of hypertension, GERD, GAD, difficulty concentrating who presents today for follow-up of hypertension.  He was last evaluated on 07/30/2022 for follow-up of hypertension.  His blood pressure remained elevated despite elimination of Adderall.  Given his elevated readings we initiated losartan 50 mg daily.  He is here for follow-up today.  Since his last visit he's been taking losartan 50 mg daily. He does not check BP at home as his BP cuff reads very high. He has noticed a reduction in headaches and pressure behind his eyes.   He is pending incisional hernia repair per general surgery. He plans on having this done before the year end. He is also waiting to hear regarding ADHD testing.   BP Readings from Last 3 Encounters:  08/16/22 (!) 158/86  08/04/22 (!) 157/111  07/30/22 (!) 162/98      Review of Systems  Eyes:  Negative for visual disturbance.  Respiratory:  Negative for shortness of breath.   Cardiovascular:  Negative for chest pain.  Neurological:  Positive for headaches.         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  Tobacco Use   Smoking status: Never   Smokeless tobacco: Never  Substance and Sexual Activity   Alcohol use: Yes    Comment: OCCASIONAL   Drug use: No   Sexual  activity: Not on file  Other Topics Concern   Not on file  Dresser for [employer]   Married 2 kids, 3rd child died of hypoplastic L heart January 31, 2012   Social Determinants of Health   Financial Resource Strain: Not on file  Food Insecurity: Not on file  Transportation Needs: Not on file  Physical Activity: Not on file  Stress: Not on file  Social Connections: Not on file  Intimate Partner Violence: Not on file    Past Surgical History:  Procedure Laterality Date   CHOLECYSTECTOMY N/A 04/08/2016   Procedure: LAPAROSCOPIC CHOLECYSTECTOMY WITH INTRAOPERATIVE CHOLANGIOGRAM;  Surgeon: Clayburn Pert, MD;  Location: [facility] ORS;  Service: General;  Laterality: N/A;   TONSILLECTOMY AND ADENOIDECTOMY  01/30/1990    Family History  Problem Relation Age of Onset   Drug abuse Mother    Hypertension Mother    Mental illness Mother    Diabetes Father    Bipolar disorder Sister    Depression Brother     Allergies  Allergen Reactions   Amoxicillin Hives    Has patient had a PCN reaction causing immediate rash, facial/tongue/throat swelling, SOB or lightheadedness with hypotension: Yes Has patient had a PCN reaction causing severe rash involving mucus membranes or skin necrosis: Yes Has patient had a  PCN reaction that required hospitalization No Has patient had a PCN reaction occurring within the last 10 years: No If all of the above answers are "NO", then may proceed with Cephalosporin use.     Current Outpatient Medications on File Prior to Visit  Medication Sig Dispense Refill   amphetamine-dextroamphetamine (ADDERALL) 5 MG tablet Take 5-10mg  in the morning and 5-10mg  after lunch.     Melatonin 10 MG TABS Take 10 mg by mouth.     naproxen sodium (ALEVE) 220 MG tablet Take 220 mg by mouth daily as needed.     omeprazole (PRILOSEC) 20 MG capsule TAKE 1 CAPSULE BY MOUTH EVERY DAY for heartburn. 90 capsule 3   venlafaxine XR (EFFEXOR-XR) 75 MG 24 hr capsule  TAKE 1 CAPSULE BY MOUTH DAILY WITH BREAKFAST FOR ANXIETY OR DEPRESSION 90 capsule 0   No current facility-administered medications on file prior to visit.    BP (!) 158/86   Pulse 100   Temp (!) 97.3 F (36.3 C) (Temporal)   Ht 5\' 11"  (1.803 m)   Wt (!) 312 lb (141.5 kg)   SpO2 96%   BMI 43.52 kg/m  Objective:   Physical Exam Cardiovascular:     Rate and Rhythm: Normal rate and regular rhythm.  Pulmonary:     Effort: Pulmonary effort is normal.     Breath sounds: Normal breath sounds. No wheezing or rales.  Musculoskeletal:     Cervical back: Neck supple.  Skin:    General: Skin is warm and dry.  Neurological:     Mental Status: He is alert and oriented to person, place, and time.           Assessment & Plan:   Problem List Items Addressed This Visit       Cardiovascular and Mediastinum   Essential hypertension - Primary    Improved but not at goal.  Continue losartan 50 mg daily, add HCTZ 12.5 mg daily. Rx for losartan-HCTZ 50-12.5 mg sent to pharmacy.  BMP pending.      Relevant Medications   losartan-hydrochlorothiazide (HYZAAR) 50-12.5 MG tablet   Other Relevant Orders   Basic metabolic panel     Other   Frequent headaches    Improving with better BP control. Continue to monitor.       Abdominal hernia    Following with general surgery, office notes reviewed from October 2023.          November 2023, NP

## 2022-08-19 ENCOUNTER — Telehealth: Payer: Self-pay | Admitting: Surgery

## 2022-08-19 NOTE — Telephone Encounter (Signed)
At patient's request will cancel surgery for now on 08/26/22.  According to his health insurance that he currently has, he has absolutely no coverage for surgeries, any kind of surgery.  Patient states that he also called his insurance and they confirmed this with him.  Patient is looking at alternative health insurance for next year and will call us back for office appointment and will reschedule surgery from there.

## 2022-08-23 ENCOUNTER — Other Ambulatory Visit: Payer: No Typology Code available for payment source

## 2022-08-26 ENCOUNTER — Ambulatory Visit
Admission: RE | Admit: 2022-08-26 | Payer: No Typology Code available for payment source | Source: Ambulatory Visit | Admitting: Surgery

## 2022-08-26 ENCOUNTER — Other Ambulatory Visit: Payer: Self-pay | Admitting: Primary Care

## 2022-08-26 ENCOUNTER — Encounter: Admission: RE | Payer: Self-pay | Source: Ambulatory Visit

## 2022-08-26 DIAGNOSIS — I10 Essential (primary) hypertension: Secondary | ICD-10-CM

## 2022-08-26 SURGERY — REPAIR, HERNIA, VENTRAL, ROBOT-ASSISTED
Anesthesia: General

## 2022-08-30 ENCOUNTER — Encounter: Payer: Self-pay | Admitting: Primary Care

## 2022-08-30 ENCOUNTER — Ambulatory Visit: Payer: No Typology Code available for payment source | Admitting: Primary Care

## 2022-08-30 VITALS — BP 150/98 | HR 88 | Temp 97.3°F | Ht 71.0 in | Wt 311.0 lb

## 2022-08-30 DIAGNOSIS — R41840 Attention and concentration deficit: Secondary | ICD-10-CM | POA: Diagnosis not present

## 2022-08-30 DIAGNOSIS — G4710 Hypersomnia, unspecified: Secondary | ICD-10-CM

## 2022-08-30 DIAGNOSIS — I10 Essential (primary) hypertension: Secondary | ICD-10-CM | POA: Diagnosis not present

## 2022-08-30 DIAGNOSIS — R739 Hyperglycemia, unspecified: Secondary | ICD-10-CM | POA: Diagnosis not present

## 2022-08-30 LAB — LIPID PANEL
Cholesterol: 209 mg/dL — ABNORMAL HIGH (ref 0–200)
HDL: 32.5 mg/dL — ABNORMAL LOW (ref >39.00)
NonHDL: 176.69
Total CHOL/HDL Ratio: 6
Triglycerides: 369 mg/dL — ABNORMAL HIGH (ref 0.0–149.0)
VLDL: 73.8 mg/dL — ABNORMAL HIGH (ref 0.0–40.0)

## 2022-08-30 LAB — HEMOGLOBIN A1C: Hgb A1c MFr Bld: 5.9 % (ref 4.6–6.5)

## 2022-08-30 LAB — BASIC METABOLIC PANEL
BUN: 12 mg/dL (ref 6–23)
CO2: 29 mEq/L (ref 19–32)
Calcium: 8.9 mg/dL (ref 8.4–10.5)
Chloride: 103 mEq/L (ref 96–112)
Creatinine, Ser: 0.93 mg/dL (ref 0.40–1.50)
GFR: 104.57 mL/min (ref >60.00)
Glucose, Bld: 100 mg/dL — ABNORMAL HIGH (ref 70–99)
Potassium: 3.9 mEq/L (ref 3.5–5.1)
Sodium: 140 mEq/L (ref 135–145)

## 2022-08-30 LAB — LDL CHOLESTEROL, DIRECT: Direct LDL: 133 mg/dL

## 2022-08-30 NOTE — Progress Notes (Signed)
Subjective:    Patient ID: Gerald [name], male    DOB: Nov 03, 1985, 36 y.o.   MRN: [id number]  Hypertension Pertinent negatives include no chest pain or shortness of breath.    Gerald [name] is a very pleasant 36 y.o. male with a history of hypertension, difficulty concentrating, migraines, GERD who presents today for follow up of hypertension and difficulty concentrating.   He was last evaluated on 08/16/22 for follow up of hypertension. During this visit his BP was improved but still above goal. He was compliant to his losartan 50 mg daily. Given his continued elevated reading HCTZ 12.5 mg was added to his regimen. He is here for follow up today.  Since his last visit he is taking losartan-HCTZ 50-12.5 mg daily. He checked his BP at home which is running 160's/100's. He has felt dizzy a few times at work.   He does snore at night, denies periods of apnea during sleep. He does feeling daytime drowsiness, can fall asleep anytime during the day if left alone. He's unsure of a family history of sleep apnea. Evaluated by pulmonology years ago for suspected sleep apnea and he never underwent the recommended sleep study.    Evaluated by behavorial health specialist a few weeks ago for ADHD evaluation, diagnosed with ADHD and initiated on Strattera 40 mg. His Adderall was discontinued given his uncontrolled hypertension. He hasn't noticed improvement on Strattera, doesn't feel like this helps.  BP Readings from Last 3 Encounters:  08/30/22 (!) 150/98  08/16/22 (!) 158/86  08/04/22 (!) 157/111      Review of Systems  Respiratory:  Negative for shortness of breath.   Cardiovascular:  Negative for chest pain.  Neurological:  Positive for dizziness.         Past Medical History:  Diagnosis Date   Anxiety    Complication of anesthesia    Difficult intubation    Headache    migraiones   Lyme disease    Viral URI with cough 06/07/2013    Social History   Socioeconomic History    Marital status: Married    Spouse name: Not on file   Number of children: Not on file   Years of education: Not on file   Highest education level: Not on file  Occupational History   Occupation: Event organiser: Production manager  Tobacco Use   Smoking status: Never   Smokeless tobacco: Never  Substance and Sexual Activity   Alcohol use: Yes    Comment: OCCASIONAL   Drug use: No   Sexual activity: Not on file  Other Topics Concern   Not on file  Social History Brewing technologist for [employer]   Married 2 kids, 3rd child died of hypoplastic L heart 2012/01/29   Social Determinants of Health   Financial Resource Strain: Not on file  Food Insecurity: Not on file  Transportation Needs: Not on file  Physical Activity: Not on file  Stress: Not on file  Social Connections: Not on file  Intimate Partner Violence: Not on file    Past Surgical History:  Procedure Laterality Date   CHOLECYSTECTOMY N/A 04/08/2016   Procedure: LAPAROSCOPIC CHOLECYSTECTOMY WITH INTRAOPERATIVE CHOLANGIOGRAM;  Surgeon: Ricarda Frame, MD;  Location: [facility] ORS;  Service: General;  Laterality: N/A;   TONSILLECTOMY AND ADENOIDECTOMY  ~1991    Family History  Problem Relation Age of Onset   Drug abuse Mother    Hypertension Mother    Mental illness Mother  Diabetes Father    Bipolar disorder Sister    Depression Brother     Allergies  Allergen Reactions   Amoxicillin Hives    Has patient had a PCN reaction causing immediate rash, facial/tongue/throat swelling, SOB or lightheadedness with hypotension: Yes Has patient had a PCN reaction causing severe rash involving mucus membranes or skin necrosis: Yes Has patient had a PCN reaction that required hospitalization No Has patient had a PCN reaction occurring within the last 10 years: No If all of the above answers are "NO", then may proceed with Cephalosporin use.     Current Outpatient Medications on File Prior to Visit  Medication Sig  Dispense Refill   atomoxetine (STRATTERA) 40 MG capsule Take 40 mg by mouth every morning.     losartan-hydrochlorothiazide (HYZAAR) 50-12.5 MG tablet Take 1 tablet by mouth daily. for blood pressure. 30 tablet 0   Melatonin 10 MG TABS Take 10 mg by mouth.     naproxen sodium (ALEVE) 220 MG tablet Take 220 mg by mouth daily as needed.     omeprazole (PRILOSEC) 20 MG capsule TAKE 1 CAPSULE BY MOUTH EVERY DAY for heartburn. 90 capsule 3   venlafaxine XR (EFFEXOR-XR) 75 MG 24 hr capsule TAKE 1 CAPSULE BY MOUTH DAILY WITH BREAKFAST FOR ANXIETY OR DEPRESSION 90 capsule 0   No current facility-administered medications on file prior to visit.    BP (!) 150/98   Pulse 88   Temp (!) 97.3 F (36.3 C) (Temporal)   Ht 5\' 11"  (1.803 m)   Wt (!) 311 lb (141.1 kg)   SpO2 97%   BMI 43.38 kg/m  Objective:   Physical Exam Cardiovascular:     Rate and Rhythm: Normal rate and regular rhythm.  Pulmonary:     Effort: Pulmonary effort is normal.     Breath sounds: Normal breath sounds. No wheezing or rales.  Musculoskeletal:     Cervical back: Neck supple.  Skin:    General: Skin is warm and dry.  Neurological:     Mental Status: He is alert and oriented to person, place, and time.           Assessment & Plan:   Problem List Items Addressed This Visit       Cardiovascular and Mediastinum   Essential hypertension - Primary    Remains uncontrolled despite treatment.  Looking at secondary causes, sleep apnea is highly likely. Referral placed to pulmonology.  Checking BMP, A1C, lipids today. Consider increasing losartan to 100 mg and continuing HCTZ 12.5 mg daily. Await results.       Relevant Orders   Basic metabolic panel   Basic metabolic panel   Lipid panel     Other   Hypersomnolence    Symptoms suggestive of sleep apnea, especially in the setting of uncontrolled hypertension.  Referral placed to pulmonology for sleep study.      Relevant Orders   Ambulatory referral to  Pulmonology   Difficulty concentrating    Diagnosed with ADHD recently, we do not have these records. He will have his behavorial health provider send over office notes.   Continue Strattera 40 mg daily for now.      Other Visit Diagnoses     Hyperglycemia       Relevant Orders   Hemoglobin A1c          , NP

## 2022-08-30 NOTE — Assessment & Plan Note (Signed)
Symptoms suggestive of sleep apnea, especially in the setting of uncontrolled hypertension.  Referral placed to pulmonology for sleep study.

## 2022-08-30 NOTE — Addendum Note (Signed)
Addended by: Shon Millet on: 08/30/2022 10:28 AM   Modules accepted: Orders

## 2022-08-30 NOTE — Assessment & Plan Note (Addendum)
Remains uncontrolled despite treatment.  Looking at secondary causes, sleep apnea is highly likely. Referral placed to pulmonology.  Checking BMP, A1C, lipids today. Consider increasing losartan to 100 mg and continuing HCTZ 12.5 mg daily. Await results.

## 2022-08-30 NOTE — Patient Instructions (Signed)
Stop by the lab prior to leaving today. I will notify you of your results once received.   It was a pleasure to see you today!  

## 2022-08-30 NOTE — Assessment & Plan Note (Signed)
Diagnosed with ADHD recently, we do not have these records. He will have his behavorial health provider send over office notes.   Continue Strattera 40 mg daily for now.

## 2022-09-13 ENCOUNTER — Other Ambulatory Visit: Payer: Self-pay | Admitting: Primary Care

## 2022-09-13 DIAGNOSIS — I10 Essential (primary) hypertension: Secondary | ICD-10-CM

## 2022-09-13 NOTE — Telephone Encounter (Signed)
FYI- Refill request routed to [facility]

## 2022-09-20 ENCOUNTER — Ambulatory Visit: Payer: No Typology Code available for payment source | Admitting: Primary Care

## 2022-09-20 ENCOUNTER — Encounter: Payer: Self-pay | Admitting: Primary Care

## 2022-09-20 VITALS — BP 150/98 | HR 100 | Temp 97.3°F | Ht 71.0 in | Wt 313.0 lb

## 2022-09-20 DIAGNOSIS — I10 Essential (primary) hypertension: Secondary | ICD-10-CM

## 2022-09-20 DIAGNOSIS — F900 Attention-deficit hyperactivity disorder, predominantly inattentive type: Secondary | ICD-10-CM

## 2022-09-20 MED ORDER — LOSARTAN POTASSIUM-HCTZ 50-12.5 MG PO TABS: 2.0000 | ORAL_TABLET | Freq: Every day | ORAL | 0 refills | Status: DC

## 2022-09-20 NOTE — Assessment & Plan Note (Signed)
Uncontrolled, but he's not been taking the extra 50 mg of losartan.  Given his continued uncontrolled readings, coupled with normal BMP from last visit, will increase his dose of losartan-HCTZ to 100-25 mg. He has plenty of his losartan-HCTZ 50-12.5 mg and will double his dose. He will notify when ready for refills.  He will send BP readings in two weeks.

## 2022-09-20 NOTE — Progress Notes (Signed)
Subjective:    Patient ID: Gerald [name], male    DOB: [birth year], 36 y.o.   MRN: [id number]  Hypertension Pertinent negatives include no chest pain, headaches or shortness of breath.  Ear Fullness  Pertinent negatives include no headaches.    Gerald [name] is a very pleasant 36 y.o. male with a history of hypertension, migraines, difficulty concentrating, obesity who presents today for hypertension.  He was last evaluated on 08/30/2022 for follow-up of hypertension.  During this visit his blood pressure was about the same as prior visits despite compliance to losartan-hydrochlorothiazide 50-12.5 mg daily.  He also screened positive for symptoms of sleep apnea.  During this visit we referred him to pulmonology for a sleep study and his medication regimen was increased to losartan-hydrochlorothiazide 100-12.5 mg daily.  He is here for follow-up today. Since his last visit he is checking his BP at home which is running 130's-140's/80's-90's. He is compliant to his losartan-HCTZ 50-12.5 mg daily. He ran out of his losartan 50 mg dose 10 days ago.   He does experience headaches which are less frequent in general. He has noticed dizziness with position changes on occasion, typically abates within a few seconds. Not bothersome.   BP Readings from Last 3 Encounters:  09/20/22 (!) 150/98  08/30/22 (!) 150/98  08/16/22 (!) 158/86        Review of Systems  Eyes:  Negative for visual disturbance.  Respiratory:  Negative for shortness of breath.   Cardiovascular:  Negative for chest pain.  Neurological:  Positive for dizziness. Negative for headaches.         Past Medical History:  Diagnosis Date   Anxiety    Complication of anesthesia    Difficult intubation    Headache    migraiones   Lyme disease    Viral URI with cough 06/07/2013    Social History   Socioeconomic History   Marital status: Married    Spouse name: Not on file   Number of children: Not on file   Years of  education: Not on file   Highest education level: Not on file  Occupational History   Occupation: Event organiser: Production manager  Tobacco Use   Smoking status: Never   Smokeless tobacco: Never  Substance and Sexual Activity   Alcohol use: Yes    Comment: OCCASIONAL   Drug use: No   Sexual activity: Not on file  Other Topics Concern   Not on file  Social History Brewing technologist for [employer]   Married 2 kids, 3rd child died of hypoplastic L heart 2012-01-17   Social Determinants of Health   Financial Resource Strain: Not on file  Food Insecurity: Not on file  Transportation Needs: Not on file  Physical Activity: Not on file  Stress: Not on file  Social Connections: Not on file  Intimate Partner Violence: Not on file    Past Surgical History:  Procedure Laterality Date   CHOLECYSTECTOMY N/A 04/08/2016   Procedure: LAPAROSCOPIC CHOLECYSTECTOMY WITH INTRAOPERATIVE CHOLANGIOGRAM;  Surgeon: Ricarda Frame, MD;  Location: [facility] ORS;  Service: General;  Laterality: N/A;   TONSILLECTOMY AND ADENOIDECTOMY  ~1991    Family History  Problem Relation Age of Onset   Drug abuse Mother    Hypertension Mother    Mental illness Mother    Diabetes Father    Bipolar disorder Sister    Depression Brother     Allergies  Allergen Reactions   Amoxicillin Hives  Has patient had a PCN reaction causing immediate rash, facial/tongue/throat swelling, SOB or lightheadedness with hypotension: Yes Has patient had a PCN reaction causing severe rash involving mucus membranes or skin necrosis: Yes Has patient had a PCN reaction that required hospitalization No Has patient had a PCN reaction occurring within the last 10 years: No If all of the above answers are "NO", then may proceed with Cephalosporin use.     Current Outpatient Medications on File Prior to Visit  Medication Sig Dispense Refill   Melatonin 10 MG TABS Take 10 mg by mouth.     naproxen sodium (ALEVE) 220 MG tablet  Take 220 mg by mouth daily as needed.     omeprazole (PRILOSEC) 20 MG capsule TAKE 1 CAPSULE BY MOUTH EVERY DAY for heartburn. 90 capsule 3   venlafaxine XR (EFFEXOR-XR) 75 MG 24 hr capsule TAKE 1 CAPSULE BY MOUTH DAILY WITH BREAKFAST FOR ANXIETY OR DEPRESSION 90 capsule 0   No current facility-administered medications on file prior to visit.    BP (!) 150/98   Pulse 100   Temp (!) 97.3 F (36.3 C) (Temporal)   Ht 5\' 11"  (1.803 m)   Wt (!) 313 lb (142 kg)   SpO2 98%   BMI 43.65 kg/m  Objective:   Physical Exam Cardiovascular:     Rate and Rhythm: Normal rate and regular rhythm.  Pulmonary:     Effort: Pulmonary effort is normal.     Breath sounds: Normal breath sounds. No wheezing or rales.  Musculoskeletal:     Cervical back: Neck supple.  Skin:    General: Skin is warm and dry.  Neurological:     Mental Status: He is alert and oriented to person, place, and time.           Assessment & Plan:   Problem List Items Addressed This Visit       Cardiovascular and Mediastinum   Essential hypertension - Primary    Uncontrolled, but he's not been taking the extra 50 mg of losartan.  Given his continued uncontrolled readings, coupled with normal BMP from last visit, will increase his dose of losartan-HCTZ to 100-25 mg. He has plenty of his losartan-HCTZ 50-12.5 mg and will double his dose. He will notify when ready for refills.  He will send BP readings in two weeks.       Relevant Medications   losartan-hydrochlorothiazide (HYZAAR) 50-12.5 MG tablet     Other   ADHD, predominantly inattentive type    Formally diagnosed with attention specialist in [place], [place]. We have some records, trying to get all records.  Remain off stimulant treatment until BP is <140/90. Strattera ineffective. Will initiate Adderall XR 10 mg daily once BP has come to goal.           [place], NP

## 2022-09-20 NOTE — Patient Instructions (Signed)
Increase the dose of your losartan-HCTZ to 2 pills daily.  Send me blood pressure readings in two weeks as discussed.  It was a pleasure to see you today!

## 2022-09-20 NOTE — Assessment & Plan Note (Signed)
Formally diagnosed with attention specialist in [place], [place]. We have some records, trying to get all records.  Remain off stimulant treatment until BP is <140/90. Strattera ineffective. Will initiate Adderall XR 10 mg daily once BP has come to goal.

## 2022-09-22 DIAGNOSIS — F900 Attention-deficit hyperactivity disorder, predominantly inattentive type: Secondary | ICD-10-CM

## 2022-09-22 DIAGNOSIS — I10 Essential (primary) hypertension: Secondary | ICD-10-CM

## 2022-09-27 ENCOUNTER — Encounter: Payer: Self-pay | Admitting: Internal Medicine

## 2022-09-27 ENCOUNTER — Ambulatory Visit (INDEPENDENT_AMBULATORY_CARE_PROVIDER_SITE_OTHER): Payer: No Typology Code available for payment source | Admitting: Internal Medicine

## 2022-09-27 VITALS — BP 138/84 | HR 105 | Temp 97.3°F | Ht 71.0 in | Wt 313.6 lb

## 2022-09-27 DIAGNOSIS — G4719 Other hypersomnia: Secondary | ICD-10-CM | POA: Diagnosis not present

## 2022-09-27 NOTE — Patient Instructions (Signed)
Obtain Home Sleep Study to assess for sleep apnea  Recommend weight Loss Goal is to lose 8 pounds in 3 months

## 2022-09-27 NOTE — Progress Notes (Signed)
Name: Gerald [name] MRN: [id number] DOB: 1986/10/09    CHIEF COMPLAINT:  EXCESSIVE DAYTIME SLEEPINESS   HISTORY OF PRESENT ILLNESS: Patient is seen today for problems and issues with sleep related to excessive daytime sleepiness Patient  has been having sleep problems for many years Patient has been having excessive daytime sleepiness for a long time Patient has been having extreme fatigue and tiredness, lack of energy +  very Loud snoring every night + struggling breathe at night and gasps for air + morning headaches + Nonrefreshing sleep  Discussed sleep data and reviewed with patient.  Encouraged proper weight management.  Discussed driving precautions and its relationship with hypersomnolence.  Discussed operating dangerous equipment and its relationship with hypersomnolence.  Discussed sleep hygiene, and benefits of a fixed sleep waked time.  The importance of getting eight or more hours of sleep discussed with patient.  Discussed limiting the use of the computer and television before bedtime.  Decrease naps during the day, so night time sleep will become enhanced.  Limit caffeine, and sleep deprivation.  HTN, stroke, and heart failure are potential risk factors.    EPWORTH SLEEP SCORE 12  Weighs 313 pounds Gained 30 pounds last 3 years  Non smoker Non ETOH abuse Works as Tour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 [employer] Lots of traveling     PAST MEDICAL HISTORY :   has a past medical history of Anxiety, Complication of anesthesia, Difficult intubation, Headache, Lyme disease, and Viral URI with cough (06/07/2013).  has a past surgical history that includes Tonsillectomy and adenoidectomy (~1991) and Cholecystectomy (N/A, 04/08/2016). Prior to Admission medications   Medication Sig Start Date End Date Taking? Authorizing Provider  losartan-hydrochlorothiazide (HYZAAR) 50-12.5 MG tablet Take 2 tablets by mouth daily. for blood pressure. 09/20/22   Doreene Nest, NP  Melatonin  10 MG TABS Take 10 mg by mouth.    [provider]  naproxen sodium (ALEVE) 220 MG tablet Take 220 mg by mouth daily as needed.    [provider]  omeprazole (PRILOSEC) 20 MG capsule TAKE 1 CAPSULE BY MOUTH EVERY DAY for heartburn. 04/22/21   Doreene Nest, NP  venlafaxine XR (EFFEXOR-XR) 75 MG 24 hr capsule TAKE 1 CAPSULE BY MOUTH DAILY WITH BREAKFAST FOR ANXIETY OR DEPRESSION 07/25/22   Doreene Nest, NP   Allergies  Allergen Reactions   Amoxicillin Hives    Has patient had a PCN reaction causing immediate rash, facial/tongue/throat swelling, SOB or lightheadedness with hypotension: Yes Has patient had a PCN reaction causing severe rash involving mucus membranes or skin necrosis: Yes Has patient had a PCN reaction that required hospitalization No Has patient had a PCN reaction occurring within the last 10 years: No If all of the above answers are "NO", then may proceed with Cephalosporin use.     FAMILY HISTORY:  family history includes Bipolar disorder in his sister; Depression in his brother; Diabetes in his father; Drug abuse in his mother; Hypertension in his mother; Mental illness in his mother. SOCIAL HISTORY:  reports that he has never smoked. He has never used smokeless tobacco. He reports current alcohol use. He reports that he does not use drugs.   Review of Systems:  Gen:  Denies  fever, sweats, chills weight loss  HEENT: Denies blurred vision, double vision, ear pain, eye pain, hearing loss, nose bleeds, sore throat Cardiac:  No dizziness, chest pain or heaviness, chest tightness,edema, No JVD Resp:   No cough, -sputum production, -shortness of breath,-wheezing, -hemoptysis,  Gi: Denies swallowing difficulty, stomach pain, nausea or vomiting, diarrhea, constipation, bowel incontinence Gu:  Denies bladder incontinence, burning urine Ext:   Denies Joint pain, stiffness or swelling Skin: Denies  skin rash, easy bruising or bleeding or  hives Endoc:  Denies polyuria, polydipsia , polyphagia or weight change Psych:   Denies depression, insomnia or hallucinations  Other:  All other systems negative   ALL OTHER ROS ARE NEGATIVE   BP 138/84 (BP Location: Left Arm, Cuff Size: Large)   Pulse (!) 105   Temp (!) 97.3 F (36.3 C)   Ht 5\' 11"  (1.803 m)   Wt (!) 313 lb 9.6 oz (142.2 kg)   SpO2 97%   BMI 43.74 kg/m    Physical Examination:   General Appearance: No distress  EYES PERRLA, EOM intact.   NECK Supple, No JVD Pulmonary: normal breath sounds, No wheezing.  CardiovascularNormal S1,S2.  No m/r/g.   Abdomen: Benign, Soft, non-tender. Skin:   warm, no rashes, no ecchymosis  Extremities: normal, no cyanosis, clubbing. Neuro:without focal findings,  speech normal  PSYCHIATRIC: Mood, affect within normal limits.   ALL OTHER ROS ARE NEGATIVE    ASSESSMENT AND PLAN SYNOPSIS  Patient with signs and symptoms of excessive daytime sleepiness with probable underlying diagnosis of obstructive sleep apnea  Recommend Sleep Study for definitve diagnosis  Obesity -recommend significant weight loss -recommend changing diet  Deconditioned state -Recommend increased daily activity and exercise   MEDICATION ADJUSTMENTS/LABS AND TESTS ORDERED: Sleep Study Recommend weight loss   CURRENT MEDICATIONS REVIEWED AT LENGTH WITH PATIENT TODAY   Patient  satisfied with Plan of action and management. All questions answered  Follow up  3 months  Total Time Spent  35 mins   Wallis Bamberg, M.D.  [facility]  Medical Director [facility] Medical Director [facility] Cardio-Pulmonary Department

## 2022-09-29 MED ORDER — LOSARTAN POTASSIUM-HCTZ 100-25 MG PO TABS: 1.0000 | ORAL_TABLET | Freq: Every day | ORAL | 0 refills | Status: DC

## 2022-09-29 MED ORDER — AMPHETAMINE-DEXTROAMPHETAMINE 10 MG PO TABS: 10.0000 mg | ORAL_TABLET | Freq: Two times a day (BID) | ORAL | 0 refills | Status: DC

## 2022-09-30 MED ORDER — AMPHETAMINE-DEXTROAMPHET ER 10 MG PO CP24: 10.0000 mg | ORAL_CAPSULE | Freq: Every day | ORAL | 0 refills | Status: DC

## 2022-10-20 ENCOUNTER — Ambulatory Visit: Payer: BC Managed Care – PPO | Admitting: Surgery

## 2022-10-20 ENCOUNTER — Encounter: Payer: Self-pay | Admitting: Surgery

## 2022-10-20 VITALS — BP 138/88 | HR 121 | Temp 97.8°F | Ht 73.0 in | Wt 314.2 lb

## 2022-10-20 DIAGNOSIS — K432 Incisional hernia without obstruction or gangrene: Secondary | ICD-10-CM | POA: Diagnosis not present

## 2022-10-20 NOTE — Patient Instructions (Signed)
Our surgery scheduler Barbara will call you within 24-48 hours to get you scheduled. If you have not heard from her after 48 hours, please call our office. Have the blue sheet available when she calls to write down important information.   If you have any concerns or questions, please feel free to call our office.   Laparoscopic Ventral Hernia Repair Laparoscopic ventral hernia repairis a procedure to fix a bulge of tissue that pushes through a weak area of muscle in the abdomen (ventral hernia). A ventral hernia may be: Above the belly button. This is called an epigastric hernia. At the belly button. This is called an umbilical hernia. At the incision site from previous abdominal surgery. This is called an incisional hernia. You may have this procedure as emergency surgery if part of your intestine gets trapped inside the hernia and starts to lose its blood supply (strangulation). Laparoscopic surgery is done through small incisions using a thin surgical telescope with a light and camera (laparoscope). During surgery, your surgeon will use images from the laparoscope to guide the procedure. A mesh screen will be placed in the hernia to close the opening and strengthen the abdominal wall. Tell a health care provider about: Any allergies you have. All medicines you are taking, including vitamins, herbs, eye drops, creams, and over-the-counter medicines. Any problems you or family members have had with anesthetic medicines. Any blood disorders you have. Any surgeries you have had. Any medical conditions you have. Whether you are pregnant or may be pregnant. What are the risks? Generally, this is a safe procedure. However, problems may occur, including: Infection. Bleeding. Damage to nearby structures or organs in the abdomen. Trouble urinating or having a bowel movement after surgery. Blood clots. The hernia coming back after surgery. Fluid buildup in the area of the hernia. In some cases,  your health care provider may need to switch from a laparoscopic procedure to a procedure that is done through a single, larger incision in the abdomen (open procedure). You may need an open procedure if: You have a hernia that is difficult to repair. Your organs are hard to see with the laparoscope. You have bleeding problems during the laparoscopic procedure. What happens before the procedure? Staying hydrated Follow instructions from your health care provider about hydration, which may include: Up to 2 hours before the procedure - you may continue to drink clear liquids, such as water, clear fruit juice, black coffee, and plain tea.  Eating and drinking restrictions Follow instructions from your health care provider about eating and drinking, which may include: 8 hours before the procedure - stop eating heavy meals or foods, such as meat, fried foods, or fatty foods. 6 hours before the procedure - stop eating light meals or foods, such as toast or cereal. 6 hours before the procedure - stop drinking milk or drinks that contain milk. 2 hours before the procedure - stop drinking clear liquids. Medicines Ask your health care provider about: Changing or stopping your regular medicines. This is especially important if you are taking diabetes medicines or blood thinners. Taking medicines such as aspirin and ibuprofen. These medicines can thin your blood. Do not take these medicines unless your health care provider tells you to take them. Taking over-the-counter medicines, vitamins, herbs, and supplements. Tests You may need to have tests before the procedure, such as: Blood tests. Urine tests. Abdominal ultrasound. Chest X-ray. Electrocardiogram (ECG). General instructions You may be asked to take a laxative or do an enema to   empty your bowel before surgery (bowel prep). Do not use any products that contain nicotine or tobacco for at least 4 weeks before the procedure. These products  include cigarettes, chewing tobacco, and vaping devices, such as e-cigarettes. If you need help quitting, ask your health care provider. Ask your health care provider: How your surgery site will be marked. What steps will be taken to help prevent infection. These steps may include: Removing hair at the surgery site. Washing skin with a germ-killing soap. Receiving antibiotic medicine. Plan to have a responsible adult take you home from the hospital or clinic. If you will be going home right after the procedure, plan to have a responsible adult care for you for the time you are told. This is important. What happens during the procedure?  An IV will be inserted into one of your veins. You will be given one or more of the following: A medicine to help you relax (sedative). A medicine to numb the area (local anesthetic). A medicine to make you fall asleep (general anesthetic). A small incision will be made in your abdomen. A hollow metal tube (trocar) will be placed through the incision. A tube will be placed through the trocar to inflate your abdomen with carbon dioxide. This makes it easier for your surgeon to see inside your abdomen during the repair. A laparoscope will be inserted into your abdomen through the trocar. The laparoscope will send images to a monitor in the operating room. Other trocars will be put through other small incisions in your abdomen. The surgical instruments needed for the procedure will be placed through these trocars. The tissue or intestines that make up the hernia will be moved back into place. The edges of the hernia may be stitched (sutured) together. A piece of mesh will be used to close the hernia. Sutures, clips, or staples will be used to keep the mesh in place. A bandage (dressing) or skin glue will be put over the incisions. The procedure may vary among health care providers and hospitals. What happens after the procedure? Your blood pressure, heart rate,  breathing rate, and blood oxygen level will be monitored until you leave the hospital or clinic. You will continue to receive fluids and medicines through an IV. Your IV will be removed when you can drink clear fluids. You will be given pain medicine as needed. You will be encouraged to get up and walk around as soon as possible. You will be shown how to do deep breathing exercises to help prevent a lung infection. If you were given a sedative during the procedure, it can affect you for several hours. Do not drive or operate machinery until your health care provider says that it is safe. Summary Laparoscopic ventral hernia is an operation to fix a hernia using small incisions. Tell your health care provider about other medical conditions that you have and about all the medicines that you are taking. Follow instructions from your health care provider about eating and drinking before the procedure. Plan to have a responsible adult take you home from the hospital or clinic. After the procedure, you will be encouraged to walk as soon as possible. You will also be taught how to do deep breathing exercises. This information is not intended to replace advice given to you by your health care provider. Make sure you discuss any questions you have with your health care provider. Document Revised: 05/16/2020 Document Reviewed: 05/16/2020 [employer] Patient Education  2023 [employer]  

## 2022-10-20 NOTE — Progress Notes (Signed)
10/20/2022  History of Present Illness: Gerald [name] is a 37 y.o. male presenting for follow-up of an incisional hernia.  He was last seen on 08/04/2022 and surgery had been scheduled for 08/26/2022.  However his request surgery was canceled.  The patient reported that his insurance would not cover his surgery.  Today, he presents to discuss surgery further.  He reports that he has changed his insurance companies and now his surgery would be able to get covered.  He denies any new or worsening symptoms from his incisional hernia still reports the discomfort in the upper abdomen when the bulges and a sense of annoyance at the hernia itself.  Denies any episodes where the hernia is not reducible or firm or very tender.  He is wondering what the appropriate timing for surgery is.  He reports that he has some regional meetings as part of his work that he has to attend one of which is in January and the other one in March and his wife also has a procedure next month.  Past Medical History: Past Medical History:  Diagnosis Date   Anxiety    Complication of anesthesia    Difficult intubation    Headache    migraiones   Lyme disease    Viral URI with cough 06/07/2013     Past Surgical History: Past Surgical History:  Procedure Laterality Date   CHOLECYSTECTOMY N/A 04/08/2016   Procedure: LAPAROSCOPIC CHOLECYSTECTOMY WITH INTRAOPERATIVE CHOLANGIOGRAM;  Surgeon: Clayburn Pert, MD;  Location: [facility] ORS;  Service: General;  Laterality: N/A;   TONSILLECTOMY AND ADENOIDECTOMY  ~1991    Home Medications: Prior to Admission medications   Medication Sig Start Date End Date Taking? Authorizing Provider  amphetamine-dextroamphetamine (ADDERALL XR) 10 MG 24 hr capsule Take 1 capsule (10 mg total) by mouth daily. 09/30/22  Yes Pleas Koch, NP  losartan-hydrochlorothiazide (HYZAAR) 100-25 MG tablet Take 1 tablet by mouth daily. for blood pressure. 09/29/22  Yes Pleas Koch, NP  Melatonin 10 MG  TABS Take 10 mg by mouth.   Yes [provider]  naproxen sodium (ALEVE) 220 MG tablet Take 220 mg by mouth daily as needed.   Yes [provider]  omeprazole (PRILOSEC) 20 MG capsule TAKE 1 CAPSULE BY MOUTH EVERY DAY for heartburn. 04/22/21  Yes Pleas Koch, NP  venlafaxine XR (EFFEXOR-XR) 75 MG 24 hr capsule TAKE 1 CAPSULE BY MOUTH DAILY WITH BREAKFAST FOR ANXIETY OR DEPRESSION 07/25/22  Yes Pleas Koch, NP    Allergies: Allergies  Allergen Reactions   Amoxicillin Hives    Has patient had a PCN reaction causing immediate rash, facial/tongue/throat swelling, SOB or lightheadedness with hypotension: Yes Has patient had a PCN reaction causing severe rash involving mucus membranes or skin necrosis: Yes Has patient had a PCN reaction that required hospitalization No Has patient had a PCN reaction occurring within the last 10 years: No If all of the above answers are "NO", then may proceed with Cephalosporin use.     Review of Systems: Review of Systems  Constitutional:  Negative for chills and fever.  HENT:  Negative for hearing loss.   Respiratory:  Negative for shortness of breath.   Cardiovascular:  Negative for chest pain.  Gastrointestinal:  Positive for abdominal pain. Negative for nausea and vomiting.  Genitourinary:  Negative for dysuria.  Musculoskeletal:  Negative for myalgias.  Skin:  Negative for rash.  Neurological:  Negative for dizziness.  Psychiatric/Behavioral:  Negative for depression.  Physical Exam BP 138/88   Pulse (!) 121   Temp 97.8 F (36.6 C) (Oral)   Ht 6\' 1"  (1.854 m)   Wt (!) 314 lb 3.2 oz (142.5 kg)   SpO2 96%   BMI 41.45 kg/m  CONSTITUTIONAL: No acute distress. HEENT:  Normocephalic, atraumatic, extraocular motion intact. NECK: Trachea is midline, no jugular venous distention. RESPIRATORY:  Lungs are clear, and breath sounds are equal bilaterally. Normal respiratory effort without pathologic use of accessory  muscles. CARDIOVASCULAR: Heart is regular without murmurs, gallops, or rubs. GI: The abdomen is soft, nondistended, with some discomfort to palpation in the epigastric area at the site of his incisional hernia.  The hernia itself is reducible.  Defect size itself is hard to determine given body habitus.  All of his other incisions are well-healed.  MUSCULOSKELETAL: Normal gait, no peripheral edema NEUROLOGIC:  Motor and sensation is grossly normal.  Cranial nerves are grossly intact. PSYCH:  Alert and oriented to person, place and time. Affect is normal.  Labs/Imaging: Labs from 08/30/2022: Sodium 140, potassium 3.9, chloride 103, CO2 29, BUN 12, creatinine 0.93.  Hemoglobin A1c 5.9  Assessment and Plan: This is a 37 y.o. male with an incisional hernia.  - Discussed with patient that his hernia appears to be stable and is still reducible.  Discussed with him that we can proceed with the same plan for robotic assisted incisional hernia repair.  Reviewed the surgery at length with him again including the planned incisions, risks of bleeding, infection, injury to surrounding structures, that this would be an outpatient procedure, the use of mesh to reinforce the repair, postoperative activity restrictions, pain control, and he is willing to proceed. - Patient is wondering what the best timing for his surgery would be.  He reports that he has regional meeting on January 17 and 18, and another meeting on March 20.  His wife is undergoing a procedure on February 14 and 15th.  The patient will discuss with his wife further and call us back to schedule a date. -All of his questions have been answered.  I spent 40 minutes dedicated to the care of this patient on the date of this encounter to include pre-visit review of records, face-to-face time with the patient discussing diagnosis and management, and any post-visit coordination of care.   Melvyn Neth, [facility]

## 2022-10-25 ENCOUNTER — Telehealth: Payer: Self-pay | Admitting: Surgery

## 2022-10-25 NOTE — Telephone Encounter (Signed)
Patient has been advised of Pre-Admission date/time, and Surgery date at [facility].  Surgery Date: 12/02/22 Preadmission Testing Date: 11/22/22 (phone 8a-1p)  Patient has been made aware to call [phone], between 1-3:00pm the day before surgery, to find out what time to arrive for surgery.  '

## 2022-10-27 ENCOUNTER — Other Ambulatory Visit: Payer: Self-pay | Admitting: Primary Care

## 2022-10-27 DIAGNOSIS — F411 Generalized anxiety disorder: Secondary | ICD-10-CM

## 2022-10-27 MED ORDER — VENLAFAXINE HCL ER 75 MG PO CP24: 75.0000 mg | ORAL_CAPSULE | Freq: Every day | ORAL | 1 refills | Status: DC

## 2022-10-27 NOTE — Telephone Encounter (Signed)
From: [name] To: Office of Pleas Koch, NP Sent: 10/26/2022 12:53 PM EST Subject: Medication Renewal Request  Refills have been requested for the following medications:   venlafaxine XR (EFFEXOR-XR) 75 MG 24 hr capsule [[place] Zarazua K [facility]]  Preferred pharmacy: [id number] [address], [place] AT [address] Delivery method: [employer]

## 2022-11-05 DIAGNOSIS — F900 Attention-deficit hyperactivity disorder, predominantly inattentive type: Secondary | ICD-10-CM

## 2022-11-05 MED ORDER — AMPHETAMINE-DEXTROAMPHET ER 15 MG PO CP24: 15.0000 mg | ORAL_CAPSULE | ORAL | 0 refills | Status: DC

## 2022-11-09 ENCOUNTER — Telehealth: Payer: BC Managed Care – PPO | Admitting: Physician Assistant

## 2022-11-09 DIAGNOSIS — J069 Acute upper respiratory infection, unspecified: Secondary | ICD-10-CM

## 2022-11-09 MED ORDER — ALBUTEROL SULFATE HFA 108 (90 BASE) MCG/ACT IN AERS: 1.0000 | INHALATION_SPRAY | Freq: Four times a day (QID) | RESPIRATORY_TRACT | 0 refills | Status: DC | PRN

## 2022-11-09 MED ORDER — BENZONATATE 100 MG PO CAPS: 100.0000 mg | ORAL_CAPSULE | Freq: Three times a day (TID) | ORAL | 0 refills | Status: DC | PRN

## 2022-11-09 MED ORDER — PROMETHAZINE-DM 6.25-15 MG/5ML PO SYRP: 5.0000 mL | ORAL_SOLUTION | Freq: Four times a day (QID) | ORAL | 0 refills | Status: DC | PRN

## 2022-11-09 MED ORDER — IPRATROPIUM BROMIDE 0.03 % NA SOLN: 2.0000 | Freq: Two times a day (BID) | NASAL | 0 refills | Status: DC

## 2022-11-09 NOTE — Patient Instructions (Signed)
[name], thank you for joining [name], PA-C for today's virtual visit.  While this provider is not your primary care provider (PCP), if your PCP is located in our provider database this encounter information will be shared with them immediately following your visit.   [facility] account gives you access to today's visit and all your visits, tests, and labs performed at [facility] " click here if you don't have a [facility] account or go to [email or web address]  Consent: (Patient) Gerald [name] provided verbal consent for this virtual visit at the beginning of the encounter.  Current Medications:  Current Outpatient Medications:    albuterol (VENTOLIN HFA) 108 (90 Base) MCG/ACT inhaler, Inhale 1-2 puffs into the lungs every 6 (six) hours as needed., Disp: 8 g, Rfl: 0   benzonatate (TESSALON) 100 MG capsule, Take 1 capsule (100 mg total) by mouth 3 (three) times daily as needed., Disp: 30 capsule, Rfl: 0   ipratropium (ATROVENT) 0.03 % nasal spray, Place 2 sprays into both nostrils every 12 (twelve) hours., Disp: 30 mL, Rfl: 0   promethazine-dextromethorphan (PROMETHAZINE-DM) 6.25-15 MG/5ML syrup, Take 5 mLs by mouth 4 (four) times daily as needed for cough., Disp: 118 mL, Rfl: 0   amphetamine-dextroamphetamine (ADDERALL XR) 15 MG 24 hr capsule, Take 1 capsule by mouth every morning., Disp: 30 capsule, Rfl: 0   losartan-hydrochlorothiazide (HYZAAR) 100-25 MG tablet, Take 1 tablet by mouth daily. for blood pressure., Disp: 90 tablet, Rfl: 0   Melatonin 10 MG TABS, Take 10 mg by mouth., Disp: , Rfl:    naproxen sodium (ALEVE) 220 MG tablet, Take 220 mg by mouth daily as needed., Disp: , Rfl:    omeprazole (PRILOSEC) 20 MG capsule, TAKE 1 CAPSULE BY MOUTH EVERY DAY for heartburn., Disp: 90 capsule, Rfl: 3   venlafaxine XR (EFFEXOR-XR) 75 MG 24 hr capsule, Take 1 capsule (75 mg total) by mouth daily with breakfast. for anxiety and depression.,  Disp: 90 capsule, Rfl: 1   Medications ordered in this encounter:  Meds ordered this encounter  Medications   promethazine-dextromethorphan (PROMETHAZINE-DM) 6.25-15 MG/5ML syrup    Sig: Take 5 mLs by mouth 4 (four) times daily as needed for cough.    Dispense:  118 mL    Refill:  0    Order Specific Question:   Supervising Provider    Answer:   Chase Picket [[id number]]   benzonatate (TESSALON) 100 MG capsule    Sig: Take 1 capsule (100 mg total) by mouth 3 (three) times daily as needed.    Dispense:  30 capsule    Refill:  0    Order Specific Question:   Supervising Provider    Answer:   Bari Mantis   albuterol (VENTOLIN HFA) 108 (90 Base) MCG/ACT inhaler    Sig: Inhale 1-2 puffs into the lungs every 6 (six) hours as needed.    Dispense:  8 g    Refill:  0    Order Specific Question:   Supervising Provider    Answer:   Chase Picket [[id number]]   ipratropium (ATROVENT) 0.03 % nasal spray    Sig: Place 2 sprays into both nostrils every 12 (twelve) hours.    Dispense:  30 mL    Refill:  0    Order Specific Question:   Supervising Provider    Answer:   Chase Picket [id number]     *If you need refills on other medications prior to your  next appointment, please contact your pharmacy*  Follow-Up: Call back or seek an in-person evaluation if the symptoms worsen or if the condition fails to improve as anticipated.  [place] [phone]  Other Instructions  Viral Respiratory Infection A respiratory infection is an illness that affects part of the respiratory system, such as the lungs, nose, or throat. A respiratory infection that is caused by a virus is called a viral respiratory infection. Common types of viral respiratory infections include: A cold. The flu (influenza). A respiratory syncytial virus (RSV) infection. What are the causes? This condition is caused by a virus. The virus may spread through contact with droplets or direct  contact with infected people or their mucus or secretions. The virus may spread from person to person (is contagious). What are the signs or symptoms? Symptoms of this condition include: A stuffy or runny nose. A sore throat or cough. Shortness of breath or difficulty breathing. Yellow or green mucus (sputum). Other symptoms may include: A fever. Sweating or chills. Fatigue. Achy muscles. A headache. How is this diagnosed? This condition may be diagnosed based on: Your symptoms. A physical exam. Testing of secretions from the nose or throat. Chest X-ray. How is this treated? This condition may be treated with medicines, such as: Antiviral medicine. This may shorten the length of time a person has symptoms. Expectorants. These make it easier to cough up mucus. Decongestant nasal sprays. Acetaminophen or NSAIDs, such as ibuprofen, to relieve fever and pain. Antibiotic medicines are not prescribed for viral infections.This is because antibiotics are designed to kill bacteria. They do not kill viruses. Follow these instructions at home: Managing pain and congestion Take over-the-counter and prescription medicines only as told by your health care provider. If you have a sore throat, gargle with a mixture of salt and water 3-4 times a day or as needed. To make salt water, completely dissolve -1 tsp (3-6 g) of salt in 1 cup (237 mL) of warm water. Use nose drops made from salt water to ease congestion and soften raw skin around your nose. Take 2 tsp (10 mL) of honey at bedtime to lessen coughing at night. Do not give honey to children who are younger than 1 year. Drink enough fluid to keep your urine pale yellow. This helps prevent dehydration and helps loosen up mucus. General instructions  Rest as much as possible. Do not drink alcohol. Do not use any products that contain nicotine or tobacco. These products include cigarettes, chewing tobacco, and vaping devices, such as  e-cigarettes. If you need help quitting, ask your health care provider. Keep all follow-up visits. This is important. How is this prevented?     Get an annual flu shot. You may get the flu shot in late summer, fall, or winter. Ask your health care provider when you should get your flu shot. Avoid spreading your infection to other people. If you are sick: Wash your hands with soap and water often, especially after you cough or sneeze. Wash for at least 20 seconds. If soap and water are not available, use alcohol-based hand sanitizer. Cover your mouth when you cough. Cover your nose and mouth when you sneeze. Do not share cups or eating utensils. Clean commonly used objects often. Clean commonly touched surfaces. Stay home from work or school as told by your health care provider. Avoid contact with people who are sick during cold and flu season. This is generally fall and winter. Contact a health care provider if:  Your symptoms last for 10 days or longer. Your symptoms get worse over time. You have severe sinus pain in your face or forehead. The glands in your jaw or neck become very swollen. You have shortness of breath. Get help right away if you: Feel pain or pressure in your chest. Have trouble breathing. Faint or feel like you will faint. Have severe and persistent vomiting. Feel confused or disoriented. These symptoms may represent a serious problem that is an emergency. Do not wait to see if the symptoms will go away. Get medical help right away. Call your local emergency services (911 in the [place] Do not drive yourself to the hospital. Summary A respiratory infection is an illness that affects part of the respiratory system, such as the lungs, nose, or throat. A respiratory infection that is caused by a virus is called a viral respiratory infection. Common types of viral respiratory infections include a cold, influenza, and respiratory syncytial virus (RSV) infection. Symptoms of  this condition include a stuffy or runny nose, cough, fatigue, achy muscles, sore throat, and fevers or chills. Antibiotic medicines are not prescribed for viral infections. This is because antibiotics are designed to kill bacteria. They are not effective against viruses. This information is not intended to replace advice given to you by your health care provider. Make sure you discuss any questions you have with your health care provider. Document Revised: 01/01/2021 Document Reviewed: 01/01/2021 Elsevier Patient Education  [place].    If you have been instructed to have an in-person evaluation today at a local Urgent Care facility, please use the link below. It will take you to a list of all of our available [facility], including address, phone number and hours of operation. Please do not delay care.  [facility]  If you or a family member do not have a primary care provider, use the link below to schedule a visit and establish care. When you choose a [facility] primary care physician or advanced practice provider, you gain a long-term partner in health. Find a Primary Care Provider  Learn more about 's in-office and virtual care options: [facility]

## 2022-11-09 NOTE — Progress Notes (Signed)
Virtual Visit Consent   Gerald [name], you are scheduled for a virtual visit with a [facility] provider today. Just as with appointments in the office, your consent must be obtained to participate. Your consent will be active for this visit and any virtual visit you may have with one of our providers in the next 365 days. If you have a MyChart account, a copy of this consent can be sent to you electronically.  As this is a virtual visit, video technology does not allow for your provider to perform a traditional examination. This may limit your provider's ability to fully assess your condition. If your provider identifies any concerns that need to be evaluated in person or the need to arrange testing (such as labs, EKG, etc.), we will make arrangements to do so. Although advances in technology are sophisticated, we cannot ensure that it will always work on either your end or our end. If the connection with a video visit is poor, the visit may have to be switched to a telephone visit. With either a video or telephone visit, we are not always able to ensure that we have a secure connection.  By engaging in this virtual visit, you consent to the provision of healthcare and authorize for your insurance to be billed (if applicable) for the services provided during this visit. Depending on your insurance coverage, you may receive a charge related to this service.  I need to obtain your verbal consent now. Are you willing to proceed with your visit today? [name] has provided verbal consent on 11/09/2022 for a virtual visit (video or telephone). [name], PA-C  Date: 11/09/2022 9:22 AM  Virtual Visit via Video Note   I, [name], connected with  Gerald [name]  ([id number], 06/17/1986) on 11/09/22 at  9:15 AM EST by a video-enabled telemedicine application and verified that I am speaking with the correct person using two identifiers.  Location: Patient: Virtual Visit Location Patient:  Home Provider: Virtual Visit Location Provider: Home Office   I discussed the limitations of evaluation and management by telemedicine and the availability of in person appointments. The patient expressed understanding and agreed to proceed.    History of Present Illness: Gerald [name] is a 37 y.o. who identifies as a male who was assigned male at birth, and is being seen today for cough and loss of voice.  HPI: Cough This is a new problem. The current episode started in the past 7 days (Saturday, 11/06/22). The problem has been gradually worsening. The problem occurs every few minutes. Associated symptoms include chest pain (tightness), ear congestion, headaches, myalgias, nasal congestion, postnasal drip, rhinorrhea, a sore throat and shortness of breath (felt like could not take a deep breath). Pertinent negatives include no chills, ear pain, fever or wheezing. Associated symptoms comments: Fatigue, loss of voice, Sunday had episode while out where he felt like he may pass out. The symptoms are aggravated by lying down. Treatments tried: flonase, tylenol, excedrin migraine. The treatment provided no relief.  Negative at home covid 19 testing    Problems:  Patient Active Problem List   Diagnosis Date Noted   Concentration deficit 06/20/2022   Essential hypertension 06/20/2022   Abdominal hernia 06/20/2022   ADHD, predominantly inattentive type 09/23/2021   Right upper quadrant abdominal mass 09/23/2021   Insomnia 07/16/2020   Epigastric abdominal pain 07/24/2019   BRBPR (bright red blood per rectum) 07/24/2019   Migraine 02/23/2019   Frequent headaches 02/27/2018   Preventative  health care 07/19/2017   Mixed hyperlipidemia 07/19/2017   GERD (gastroesophageal reflux disease) 07/15/2017   Scrotal mass 08/26/2016   Morbid obesity with BMI of 40.0-44.9, adult ([place]) 12/04/2015   GAD (generalized anxiety disorder) 04/09/2015   Chest pain of uncertain etiology 48/25/0037   Hypersomnolence  12/02/2011   Fatigue 11/09/2011    Allergies:  Allergies  Allergen Reactions   Amoxicillin Hives    Has patient had a PCN reaction causing immediate rash, facial/tongue/throat swelling, SOB or lightheadedness with hypotension: Yes Has patient had a PCN reaction causing severe rash involving mucus membranes or skin necrosis: Yes Has patient had a PCN reaction that required hospitalization No Has patient had a PCN reaction occurring within the last 10 years: No If all of the above answers are "NO", then may proceed with Cephalosporin use.    Medications:  Current Outpatient Medications:    albuterol (VENTOLIN HFA) 108 (90 Base) MCG/ACT inhaler, Inhale 1-2 puffs into the lungs every 6 (six) hours as needed., Disp: 8 g, Rfl: 0   benzonatate (TESSALON) 100 MG capsule, Take 1 capsule (100 mg total) by mouth 3 (three) times daily as needed., Disp: 30 capsule, Rfl: 0   ipratropium (ATROVENT) 0.03 % nasal spray, Place 2 sprays into both nostrils every 12 (twelve) hours., Disp: 30 mL, Rfl: 0   promethazine-dextromethorphan (PROMETHAZINE-DM) 6.25-15 MG/5ML syrup, Take 5 mLs by mouth 4 (four) times daily as needed for cough., Disp: 118 mL, Rfl: 0   amphetamine-dextroamphetamine (ADDERALL XR) 15 MG 24 hr capsule, Take 1 capsule by mouth every morning., Disp: 30 capsule, Rfl: 0   losartan-hydrochlorothiazide (HYZAAR) 100-25 MG tablet, Take 1 tablet by mouth daily. for blood pressure., Disp: 90 tablet, Rfl: 0   Melatonin 10 MG TABS, Take 10 mg by mouth., Disp: , Rfl:    naproxen sodium (ALEVE) 220 MG tablet, Take 220 mg by mouth daily as needed., Disp: , Rfl:    omeprazole (PRILOSEC) 20 MG capsule, TAKE 1 CAPSULE BY MOUTH EVERY DAY for heartburn., Disp: 90 capsule, Rfl: 3   venlafaxine XR (EFFEXOR-XR) 75 MG 24 hr capsule, Take 1 capsule (75 mg total) by mouth daily with breakfast. for anxiety and depression., Disp: 90 capsule, Rfl: 1  Observations/Objective: Patient is well-developed, well-nourished in  no acute distress.  Resting comfortably at home.  Head is normocephalic, atraumatic.  No labored breathing.  Speech is clear and coherent with logical content.  Patient is alert and oriented at baseline.    Assessment and Plan: 1. Viral URI with cough - promethazine-dextromethorphan (PROMETHAZINE-DM) 6.25-15 MG/5ML syrup; Take 5 mLs by mouth 4 (four) times daily as needed for cough.  Dispense: 118 mL; Refill: 0 - benzonatate (TESSALON) 100 MG capsule; Take 1 capsule (100 mg total) by mouth 3 (three) times daily as needed.  Dispense: 30 capsule; Refill: 0 - albuterol (VENTOLIN HFA) 108 (90 Base) MCG/ACT inhaler; Inhale 1-2 puffs into the lungs every 6 (six) hours as needed.  Dispense: 8 g; Refill: 0 - ipratropium (ATROVENT) 0.03 % nasal spray; Place 2 sprays into both nostrils every 12 (twelve) hours.  Dispense: 30 mL; Refill: 0  - Suspect viral URI - Promethazine DM, Tessalon perles, Albuterol, and Ipratropium bromide nasal spray added - Symptomatic medications of choice over the counter as needed - Push fluids - Rest - Seek further evaluation if symptoms change or worsen   Follow Up Instructions: I discussed the assessment and treatment plan with the patient. The patient was provided an opportunity to ask questions and  all were answered. The patient agreed with the plan and demonstrated an understanding of the instructions.  A copy of instructions were sent to the patient via MyChart unless otherwise noted below.    The patient was advised to call back or seek an in-person evaluation if the symptoms worsen or if the condition fails to improve as anticipated.  Time:  I spent 12 minutes with the patient via telehealth technology discussing the above problems/concerns.    [name], PA-C

## 2022-11-11 DIAGNOSIS — G4733 Obstructive sleep apnea (adult) (pediatric): Secondary | ICD-10-CM

## 2022-11-11 HISTORY — DX: Obstructive sleep apnea (adult) (pediatric): G47.33

## 2022-11-19 ENCOUNTER — Ambulatory Visit: Payer: BC Managed Care – PPO

## 2022-11-19 DIAGNOSIS — G4719 Other hypersomnia: Secondary | ICD-10-CM

## 2022-11-19 DIAGNOSIS — G4733 Obstructive sleep apnea (adult) (pediatric): Secondary | ICD-10-CM | POA: Diagnosis not present

## 2022-11-22 ENCOUNTER — Encounter
Admission: RE | Admit: 2022-11-22 | Discharge: 2022-11-22 | Disposition: A | Discharge status: Home / Self Care | Payer: BC Managed Care – PPO | Source: Ambulatory Visit | Attending: Surgery | Admitting: Surgery

## 2022-11-22 VITALS — Ht 73.0 in | Wt 314.2 lb

## 2022-11-22 DIAGNOSIS — Z01810 Encounter for preprocedural cardiovascular examination: Secondary | ICD-10-CM

## 2022-11-22 DIAGNOSIS — I10 Essential (primary) hypertension: Secondary | ICD-10-CM

## 2022-11-22 DIAGNOSIS — Z79899 Other long term (current) drug therapy: Secondary | ICD-10-CM

## 2022-11-22 DIAGNOSIS — G4733 Obstructive sleep apnea (adult) (pediatric): Secondary | ICD-10-CM | POA: Diagnosis not present

## 2022-11-22 DIAGNOSIS — Z01812 Encounter for preprocedural laboratory examination: Secondary | ICD-10-CM

## 2022-11-22 HISTORY — DX: Mixed hyperlipidemia: E78.2

## 2022-11-22 HISTORY — DX: Gastro-esophageal reflux disease without esophagitis: K21.9

## 2022-11-22 HISTORY — DX: Ventral hernia without obstruction or gangrene: K43.9

## 2022-11-22 HISTORY — DX: Essential (primary) hypertension: I10

## 2022-11-22 HISTORY — DX: Attention-deficit hyperactivity disorder, unspecified type: F90.9

## 2022-11-22 NOTE — Patient Instructions (Signed)
Your procedure is scheduled on:12-02-22 Thursday Report to the Registration Desk on the 1st floor of the [facility].Then proceed to the 2nd floor Surgery Desk To find out your arrival time, please call [phone] between 1PM - 3PM on:12-01-22 Wednesday If your arrival time is 6:00 am, do not arrive before that time as the [facility] entrance doors do not open until 6:00 am.  REMEMBER: Instructions that are not followed completely may result in serious medical risk, up to and including death; or upon the discretion of your surgeon and anesthesiologist your surgery may need to be rescheduled.  Do not eat food after midnight the night before surgery.  No gum chewing or hard candies.  You may however, drink CLEAR liquids up to 2 hours before you are scheduled to arrive for your surgery. Do not drink anything within 2 hours of your scheduled arrival time.  Clear liquids include: - water  - apple juice without pulp - gatorade (not RED colors) - black coffee or tea (Do NOT add milk or creamers to the coffee or tea) Do NOT drink anything that is not on this list.  One week prior to surgery: Stop Anti-inflammatories (NSAIDS) such as Advil, Aleve, Ibuprofen, Motrin, Naproxen, Naprosyn and Aspirin based products such as Excedrin, Goody's Powder, BC Powder. You may however, continue to take Tylenol if needed for pain up until the day of surgery.  Stop ANY OVER THE COUNTER supplements/vitamins until after surgery-You may continue your Melatonin up until the night prior to surgery   [place] WITH A SIP OF WATER: -venlafaxine XR (EFFEXOR-XR)  -omeprazole (PRILOSEC)-Take one the night before and one on the morning of surgery - helps to prevent nausea after surgery.)  No Alcohol for 24 hours before or after surgery.  No Smoking including e-cigarettes for 24 hours before surgery.  No chewable tobacco products for at least 6 hours before surgery.  No  nicotine patches on the day of surgery.  Do not use any "recreational" drugs for at least a week (preferably 2 weeks) before your surgery.  Please be advised that the combination of cocaine and anesthesia may have negative outcomes, up to and including death. If you test positive for cocaine, your surgery will be cancelled.  On the morning of surgery brush your teeth with toothpaste and water, you may rinse your mouth with mouthwash if you wish. Do not swallow any toothpaste or mouthwash.  Use CHG Soap as directed on instruction sheet.  Do not wear jewelry, make-up, hairpins, clips or nail polish.  Do not wear lotions, powders, or perfumes.   Do not shave body hair from the neck down 48 hours before surgery.  Contact lenses, hearing aids and dentures may not be worn into surgery.  Do not bring valuables to the hospital. [facility] is not responsible for any missing/lost belongings or valuables.    Notify your doctor if there is any change in your medical condition (cold, fever, infection).  Wear comfortable clothing (specific to your surgery type) to the hospital.  After surgery, you can help prevent lung complications by doing breathing exercises.  Take deep breaths and cough every 1-2 hours. Your doctor may order a device called an Incentive Spirometer to help you take deep breaths. When coughing or sneezing, hold a pillow firmly against your incision with both hands. This is called "splinting." Doing this helps protect your incision. It also decreases belly discomfort.  If you are being admitted to  the hospital overnight, leave your suitcase in the car. After surgery it may be brought to your room.  In case of increased patient census, it may be necessary for you, the patient, to continue your postoperative care in the Same Day Surgery department.  If you are being discharged the day of surgery, you will not be allowed to drive home. You will need a responsible individual to  drive you home and stay with you for 24 hours after surgery.   If you are taking public transportation, you will need to have a responsible individual with you.  Please call the [facility] Dept. at [phone] if you have any questions about these instructions.  Surgery Visitation Policy:  Patients undergoing a surgery or procedure may have two family members or support persons with them as long as the person is not COVID-19 positive or experiencing its symptoms.   Due to an increase in RSV and influenza rates and associated hospitalizations, children ages 24 and under will not be able to visit patients in [facility]. Masks continue to be strongly recommended.      Preparing for Surgery with CHLORHEXIDINE GLUCONATE (CHG) Soap  Chlorhexidine Gluconate (CHG) Soap  o An antiseptic cleaner that kills germs and bonds with the skin to continue killing germs even after washing  o Used for showering the night before surgery and morning of surgery  Before surgery, you can play an important role by reducing the number of germs on your skin.  CHG (Chlorhexidine gluconate) soap is an antiseptic cleanser which kills germs and bonds with the skin to continue killing germs even after washing.  Please do not use if you have an allergy to CHG or antibacterial soaps. If your skin becomes reddened/irritated stop using the CHG.  1. Shower the NIGHT BEFORE SURGERY and the MORNING OF SURGERY with CHG soap.  2. If you choose to wash your hair, wash your hair first as usual with your normal shampoo.  3. After shampooing, rinse your hair and body thoroughly to remove the shampoo.  4. Use CHG as you would any other liquid soap. You can apply CHG directly to the skin and wash gently with a scrungie or a clean washcloth.  5. Apply the CHG soap to your body only from the neck down. Do not use on open wounds or open sores. Avoid contact with your eyes, ears, mouth, and genitals  (private parts). Wash face and genitals (private parts) with your normal soap.  6. Wash thoroughly, paying special attention to the area where your surgery will be performed.  7. Thoroughly rinse your body with warm water.  8. Do not shower/wash with your normal soap after using and rinsing off the CHG soap.  9. Pat yourself dry with a clean towel.  10. Wear clean pajamas to bed the night before surgery.  12. Place clean sheets on your bed the night of your first shower and do not sleep with pets.  13. Shower again with the CHG soap on the day of surgery prior to arriving at the hospital.  14. Do not apply any deodorants/lotions/powders.  15. Please wear clean clothes to the hospital.

## 2022-11-23 ENCOUNTER — Other Ambulatory Visit: Payer: BC Managed Care – PPO

## 2022-11-24 ENCOUNTER — Encounter
Admission: RE | Admit: 2022-11-24 | Discharge: 2022-11-24 | Disposition: A | Discharge status: Home / Self Care | Payer: BC Managed Care – PPO | Source: Ambulatory Visit | Attending: Surgery | Admitting: Surgery

## 2022-11-24 DIAGNOSIS — E785 Hyperlipidemia, unspecified: Secondary | ICD-10-CM | POA: Diagnosis not present

## 2022-11-24 DIAGNOSIS — Z79899 Other long term (current) drug therapy: Secondary | ICD-10-CM

## 2022-11-24 DIAGNOSIS — K432 Incisional hernia without obstruction or gangrene: Secondary | ICD-10-CM | POA: Insufficient documentation

## 2022-11-24 DIAGNOSIS — R9431 Abnormal electrocardiogram [ECG] [EKG]: Secondary | ICD-10-CM | POA: Diagnosis not present

## 2022-11-24 DIAGNOSIS — Z6841 Body Mass Index (BMI) 40.0 and over, adult: Secondary | ICD-10-CM | POA: Diagnosis not present

## 2022-11-24 DIAGNOSIS — Z01810 Encounter for preprocedural cardiovascular examination: Secondary | ICD-10-CM

## 2022-11-24 DIAGNOSIS — I10 Essential (primary) hypertension: Secondary | ICD-10-CM | POA: Diagnosis not present

## 2022-11-24 DIAGNOSIS — Z01818 Encounter for other preprocedural examination: Secondary | ICD-10-CM | POA: Diagnosis not present

## 2022-11-24 DIAGNOSIS — Z01812 Encounter for preprocedural laboratory examination: Secondary | ICD-10-CM

## 2022-11-24 DIAGNOSIS — E669 Obesity, unspecified: Secondary | ICD-10-CM | POA: Insufficient documentation

## 2022-11-24 LAB — BASIC METABOLIC PANEL
Anion gap: 9 (ref 5–15)
BUN: 14 mg/dL (ref 6–20)
CO2: 28 mmol/L (ref 22–32)
Calcium: 9 mg/dL (ref 8.9–10.3)
Chloride: 105 mmol/L (ref 98–111)
Creatinine, Ser: 0.97 mg/dL (ref 0.61–1.24)
GFR, Estimated: >60 mL/min (ref >60)
Glucose, Bld: 99 mg/dL (ref 70–99)
Potassium: 3.6 mmol/L (ref 3.5–5.1)
Sodium: 142 mmol/L (ref 135–145)

## 2022-11-24 NOTE — Progress Notes (Signed)
  Perioperative Services Pre-Admission/Anesthesia Testing    Date: 11/24/22  Name: Gerald [name] MRN:   [id number]  Re: Abnormal preoperative ECG (prolonged QTc interval)  Planned Surgical Procedure(s):    Case: [id number] Date/Time: 12/02/22 0715   Procedure: XI ROBOTIC ASSISTED VENTRAL HERNIA, incisional hernia   Anesthesia type: General   Pre-op diagnosis: incisional hernia less 3 cm   Location: [facility] OR ROOM 06 / [place] ORS FOR ANESTHESIA GROUP   Surgeons: Olean Ree, MD   Clinical Notes:  Patient is scheduled for the above procedure on 12/02/2022 with Dr. Ardath Sax, MD. In preparation for his procedure, patient presented to the PAT clinic on the afternoon of 11/24/2022 for preoperative labs and ECG.  In review of the patient's ECG, QTc interval noted to be prolonged at 492 ms. Normal QTc range is 350-450 ms for adult men. Compared to previous tracings. Interval has been prolonged in the past as well.  With that being said, approximately 10 to 20% patient's fall outside of the aforementioned range (Rezus et al., 2015).   In review of his previous ECG tracings, patient's QTc interval prolongation dates back to at least 2017. QTc intervals calculations have been consistently determined using Bazett's formula, which takes into accounts and adjusts for, the patient's heart rate (Rezus et al., 2015). Calculated interval as follows: 464 ms (2017), 458 ms (2020), and 492 ms (2024).   Medication list reviewed. One of the more common etiologies of QTc prolongation is related to pharmacotherapy Doristine Devoid, K., & Tivakaran, V. S., 2023).  Patient is on both amphetamine-dextroamphetamine XR 15 mg daily and venlafaxine XR 75 mg daily. Both of these medications can be associated with QTc prolongation. Unsure how long patient has been on these medications. Query whether or not this finding is clinically significant. Medication mediated QTc prolongation tends to be dose dependent, and improvement is  generally seen with dose reductions/titrations.   Impression and Plan:  Gerald [name] found to have prolonged QTc interval on preoperative ECG.  Questionable clinical significance. Finding is likely medication mediated, as patient has no past medical history significant for cardiovascular diagnoses. Patient does have cardiac risk factors including age, sex, HTN, HLD, and obesity (BMI 41.45 kg/m).   In efforts to promote a safe surgical/anesthetic course, sending note to both primary attending surgeon and patient's PCP for review to solicit thoughts. Query need for further evaluation with cardiology? No changes are being made to the OR schedule at this time.   References:  Cristina Gong, Tivakaran VS. QT Prolonging Drugs. [Updated 2023 Jul 2]. In: [employer] [Internet]. [facility]): [employer]; 2024 Jan-. Available from: MaternityMeds.de  Rezu?, C., Moga, V. D., Ouatu, A., & Vanice Sarah, M. (2015). QT interval variations and mortality risk: is there any relationship?Marland Kitchen [place] journal of cardiology, 15(3), [phone]. [email or web address]  Honor Loh, MSN, APRN, FNP-C, CEN [facility]  Peri-operative Services Nurse Practitioner Phone: [phone] 11/24/22 4:27 PM  NOTE: This note has been prepared using Dragon dictation software. Despite my best ability to proofread, there is always the potential that unintentional transcriptional errors may still occur from this process.

## 2022-11-29 DIAGNOSIS — I10 Essential (primary) hypertension: Secondary | ICD-10-CM

## 2022-11-29 DIAGNOSIS — R9431 Abnormal electrocardiogram [ECG] [EKG]: Secondary | ICD-10-CM

## 2022-12-02 ENCOUNTER — Ambulatory Visit
Admission: RE | Admit: 2022-12-02 | Discharge: 2022-12-02 | Disposition: A | Discharge status: Home / Self Care | Payer: BC Managed Care – PPO | Source: Ambulatory Visit | Attending: Surgery | Admitting: Surgery

## 2022-12-02 ENCOUNTER — Encounter: Payer: Self-pay | Admitting: Surgery

## 2022-12-02 ENCOUNTER — Other Ambulatory Visit: Payer: Self-pay

## 2022-12-02 ENCOUNTER — Ambulatory Visit: Payer: BC Managed Care – PPO | Admitting: Certified Registered"

## 2022-12-02 ENCOUNTER — Encounter
Admission: RE | Disposition: A | Discharge status: Home / Self Care | Payer: Self-pay | Source: Ambulatory Visit | Attending: Surgery

## 2022-12-02 ENCOUNTER — Ambulatory Visit: Payer: BC Managed Care – PPO | Admitting: Urgent Care

## 2022-12-02 DIAGNOSIS — K219 Gastro-esophageal reflux disease without esophagitis: Secondary | ICD-10-CM | POA: Insufficient documentation

## 2022-12-02 DIAGNOSIS — Z9049 Acquired absence of other specified parts of digestive tract: Secondary | ICD-10-CM | POA: Insufficient documentation

## 2022-12-02 DIAGNOSIS — I10 Essential (primary) hypertension: Secondary | ICD-10-CM | POA: Diagnosis not present

## 2022-12-02 DIAGNOSIS — Z6841 Body Mass Index (BMI) 40.0 and over, adult: Secondary | ICD-10-CM | POA: Diagnosis not present

## 2022-12-02 DIAGNOSIS — F419 Anxiety disorder, unspecified: Secondary | ICD-10-CM | POA: Diagnosis not present

## 2022-12-02 DIAGNOSIS — K432 Incisional hernia without obstruction or gangrene: Secondary | ICD-10-CM | POA: Insufficient documentation

## 2022-12-02 HISTORY — DX: Sleep apnea, unspecified: G47.30

## 2022-12-02 HISTORY — PX: XI ROBOTIC ASSISTED VENTRAL HERNIA: SHX6789

## 2022-12-02 SURGERY — REPAIR, HERNIA, VENTRAL, ROBOT-ASSISTED
Anesthesia: General

## 2022-12-02 MED ORDER — PHENYLEPHRINE HCL-NACL 20-0.9 MG/250ML-% IV SOLN
INTRAVENOUS | Status: AC
  Filled 2022-12-02: qty 250

## 2022-12-02 MED ORDER — KETAMINE HCL 50 MG/5ML IJ SOSY
PREFILLED_SYRINGE | INTRAMUSCULAR | Status: AC
  Filled 2022-12-02: qty 5

## 2022-12-02 MED ORDER — BUPIVACAINE LIPOSOME 1.3 % IJ SUSP
INTRAMUSCULAR | Status: AC
  Filled 2022-12-02: qty 20

## 2022-12-02 MED ORDER — MIDAZOLAM HCL 2 MG/2ML IJ SOLN
INTRAMUSCULAR | Status: AC
  Filled 2022-12-02: qty 2

## 2022-12-02 MED ORDER — LIDOCAINE HCL (CARDIAC) PF 100 MG/5ML IV SOSY
PREFILLED_SYRINGE | INTRAVENOUS | Status: DC | PRN
  Administered 2022-12-02: 100 mg via INTRAVENOUS

## 2022-12-02 MED ORDER — DEXAMETHASONE SODIUM PHOSPHATE 10 MG/ML IJ SOLN
INTRAMUSCULAR | Status: AC
  Filled 2022-12-02: qty 1

## 2022-12-02 MED ORDER — OXYCODONE HCL 5 MG PO TABS
ORAL_TABLET | ORAL | Status: AC
  Administered 2022-12-02: 5 mg via ORAL
  Filled 2022-12-02: qty 1

## 2022-12-02 MED ORDER — GABAPENTIN 300 MG PO CAPS: 300.0000 mg | ORAL_CAPSULE | ORAL | Status: AC

## 2022-12-02 MED ORDER — OXYCODONE HCL 5 MG PO TABS: 5.0000 mg | ORAL_TABLET | Freq: Once | ORAL | Status: AC | PRN

## 2022-12-02 MED ORDER — ACETAMINOPHEN 500 MG PO TABS: 1000.0000 mg | ORAL_TABLET | ORAL | Status: AC

## 2022-12-02 MED ORDER — CHLORHEXIDINE GLUCONATE 0.12 % MT SOLN
OROMUCOSAL | Status: AC
  Administered 2022-12-02: 15 mL via OROMUCOSAL
  Filled 2022-12-02: qty 15

## 2022-12-02 MED ORDER — FENTANYL CITRATE (PF) 100 MCG/2ML IJ SOLN
INTRAMUSCULAR | Status: DC | PRN
  Administered 2022-12-02: 100 ug via INTRAVENOUS
  Administered 2022-12-02 (×3): 50 ug via INTRAVENOUS

## 2022-12-02 MED ORDER — DEXAMETHASONE SODIUM PHOSPHATE 10 MG/ML IJ SOLN
INTRAMUSCULAR | Status: DC | PRN
  Administered 2022-12-02: 10 mg via INTRAVENOUS

## 2022-12-02 MED ORDER — PHENYLEPHRINE 80 MCG/ML (10ML) SYRINGE FOR IV PUSH (FOR BLOOD PRESSURE SUPPORT)
PREFILLED_SYRINGE | INTRAVENOUS | Status: DC | PRN
  Administered 2022-12-02 (×7): 160 ug via INTRAVENOUS

## 2022-12-02 MED ORDER — PROPOFOL 10 MG/ML IV BOLUS
INTRAVENOUS | Status: DC | PRN
  Administered 2022-12-02: 200 mg via INTRAVENOUS

## 2022-12-02 MED ORDER — ONDANSETRON HCL 4 MG/2ML IJ SOLN
INTRAMUSCULAR | Status: DC | PRN
  Administered 2022-12-02: 4 mg via INTRAVENOUS

## 2022-12-02 MED ORDER — ONDANSETRON HCL 4 MG PO TABS: 4.0000 mg | ORAL_TABLET | Freq: Three times a day (TID) | ORAL | 0 refills | Status: DC | PRN

## 2022-12-02 MED ORDER — EPINEPHRINE PF 1 MG/ML IJ SOLN
INTRAMUSCULAR | Status: AC
  Filled 2022-12-02: qty 1

## 2022-12-02 MED ORDER — DROPERIDOL 2.5 MG/ML IJ SOLN: 0.6250 mg | Freq: Once | INTRAMUSCULAR | Status: AC

## 2022-12-02 MED ORDER — CHLORHEXIDINE GLUCONATE 0.12 % MT SOLN: 15.0000 mL | Freq: Once | OROMUCOSAL | Status: AC

## 2022-12-02 MED ORDER — ONDANSETRON HCL 4 MG/2ML IJ SOLN
INTRAMUSCULAR | Status: AC
  Filled 2022-12-02: qty 2

## 2022-12-02 MED ORDER — FENTANYL CITRATE (PF) 250 MCG/5ML IJ SOLN
INTRAMUSCULAR | Status: AC
  Filled 2022-12-02: qty 5

## 2022-12-02 MED ORDER — OXYCODONE HCL 5 MG PO TABS: 5.0000 mg | ORAL_TABLET | ORAL | 0 refills | Status: DC | PRN

## 2022-12-02 MED ORDER — ROCURONIUM BROMIDE 100 MG/10ML IV SOLN
INTRAVENOUS | Status: DC | PRN
  Administered 2022-12-02: 30 mg via INTRAVENOUS
  Administered 2022-12-02: 20 mg via INTRAVENOUS
  Administered 2022-12-02: 70 mg via INTRAVENOUS

## 2022-12-02 MED ORDER — FENTANYL CITRATE (PF) 100 MCG/2ML IJ SOLN
INTRAMUSCULAR | Status: AC
  Filled 2022-12-02: qty 2

## 2022-12-02 MED ORDER — PROPOFOL 10 MG/ML IV BOLUS
INTRAVENOUS | Status: AC
  Filled 2022-12-02: qty 40

## 2022-12-02 MED ORDER — KETAMINE HCL 10 MG/ML IJ SOLN
INTRAMUSCULAR | Status: DC | PRN
  Administered 2022-12-02: 50 mg via INTRAVENOUS

## 2022-12-02 MED ORDER — IBUPROFEN 800 MG PO TABS: 800.0000 mg | ORAL_TABLET | Freq: Three times a day (TID) | ORAL | 1 refills | Status: DC | PRN

## 2022-12-02 MED ORDER — CHLORHEXIDINE GLUCONATE CLOTH 2 % EX PADS: 6.0000 | MEDICATED_PAD | Freq: Once | CUTANEOUS | Status: DC

## 2022-12-02 MED ORDER — MIDAZOLAM HCL 2 MG/2ML IJ SOLN
INTRAMUSCULAR | Status: DC | PRN
  Administered 2022-12-02: 2 mg via INTRAVENOUS

## 2022-12-02 MED ORDER — PHENYLEPHRINE HCL-NACL 20-0.9 MG/250ML-% IV SOLN
INTRAVENOUS | Status: DC | PRN
  Administered 2022-12-02: 50 ug/min via INTRAVENOUS

## 2022-12-02 MED ORDER — LACTATED RINGERS IV SOLN: INTRAVENOUS | Status: DC

## 2022-12-02 MED ORDER — 0.9 % SODIUM CHLORIDE (POUR BTL) OPTIME
TOPICAL | Status: DC | PRN
  Administered 2022-12-02: 500 mL

## 2022-12-02 MED ORDER — CLINDAMYCIN PHOSPHATE 900 MG/50ML IV SOLN
900.0000 mg | INTRAVENOUS | Status: AC
  Administered 2022-12-02: 900 mg via INTRAVENOUS

## 2022-12-02 MED ORDER — BUPIVACAINE LIPOSOME 1.3 % IJ SUSP: 20.0000 mL | Freq: Once | INTRAMUSCULAR | Status: DC

## 2022-12-02 MED ORDER — ACETAMINOPHEN 500 MG PO TABS: 1000.0000 mg | ORAL_TABLET | Freq: Four times a day (QID) | ORAL | Status: AC | PRN

## 2022-12-02 MED ORDER — SUGAMMADEX SODIUM 200 MG/2ML IV SOLN
INTRAVENOUS | Status: DC | PRN
  Administered 2022-12-02: 250 mg via INTRAVENOUS

## 2022-12-02 MED ORDER — OXYCODONE HCL 5 MG/5ML PO SOLN: 5.0000 mg | Freq: Once | ORAL | Status: AC | PRN

## 2022-12-02 MED ORDER — GABAPENTIN 300 MG PO CAPS
ORAL_CAPSULE | ORAL | Status: AC
  Administered 2022-12-02: 300 mg via ORAL
  Filled 2022-12-02: qty 1

## 2022-12-02 MED ORDER — ORAL CARE MOUTH RINSE: 15.0000 mL | Freq: Once | OROMUCOSAL | Status: AC

## 2022-12-02 MED ORDER — CHLORHEXIDINE GLUCONATE CLOTH 2 % EX PADS
6.0000 | MEDICATED_PAD | Freq: Once | CUTANEOUS | Status: AC
  Administered 2022-12-02: 6 via TOPICAL

## 2022-12-02 MED ORDER — BUPIVACAINE HCL (PF) 0.5 % IJ SOLN
INTRAMUSCULAR | Status: AC
  Filled 2022-12-02: qty 30

## 2022-12-02 MED ORDER — CLINDAMYCIN PHOSPHATE 900 MG/50ML IV SOLN
INTRAVENOUS | Status: AC
  Filled 2022-12-02: qty 50

## 2022-12-02 MED ORDER — FENTANYL CITRATE (PF) 100 MCG/2ML IJ SOLN
25.0000 ug | INTRAMUSCULAR | Status: DC | PRN
  Administered 2022-12-02: 25 ug via INTRAVENOUS

## 2022-12-02 MED ORDER — ROCURONIUM BROMIDE 10 MG/ML (PF) SYRINGE
PREFILLED_SYRINGE | INTRAVENOUS | Status: AC
  Filled 2022-12-02: qty 20

## 2022-12-02 MED ORDER — ACETAMINOPHEN 500 MG PO TABS
ORAL_TABLET | ORAL | Status: AC
  Administered 2022-12-02: 1000 mg via ORAL
  Filled 2022-12-02: qty 2

## 2022-12-02 MED ORDER — DROPERIDOL 2.5 MG/ML IJ SOLN
INTRAMUSCULAR | Status: AC
  Administered 2022-12-02: 0.625 mg via INTRAVENOUS
  Filled 2022-12-02: qty 2

## 2022-12-02 MED ORDER — SODIUM CHLORIDE (PF) 0.9 % IJ SOLN
INTRAMUSCULAR | Status: DC | PRN
  Administered 2022-12-02: 60 mL

## 2022-12-02 SURGICAL SUPPLY — 66 items
BAG LAPAROSCOPIC 12 15 PORT 16 (BASKET) IMPLANT
BAG RETRIEVAL 12/15 (BASKET) ×1
BINDER ABDOMINAL 12 ML 46-62 (SOFTGOODS) IMPLANT
BLADE SURG SZ11 CARB STEEL (BLADE) ×1 IMPLANT
CANNULA CAP OBTURATR AIRSEAL 8 (CAP) IMPLANT
CANNULA REDUC XI 12-8 STAPL (CANNULA) ×1
CANNULA REDUCER 12-8 DVNC XI (CANNULA) ×1 IMPLANT
COVER TIP SHEARS 8 DVNC (MISCELLANEOUS) ×1 IMPLANT
COVER TIP SHEARS 8MM DA VINCI (MISCELLANEOUS) ×1
COVER WAND RF STERILE (DRAPES) ×1 IMPLANT
DERMABOND ADVANCED .7 DNX12 (GAUZE/BANDAGES/DRESSINGS) ×1 IMPLANT
DRAPE ARM DVNC X/XI (DISPOSABLE) ×3 IMPLANT
DRAPE COLUMN DVNC XI (DISPOSABLE) ×1 IMPLANT
DRAPE DA VINCI XI ARM (DISPOSABLE) ×3
DRAPE DA VINCI XI COLUMN (DISPOSABLE) ×1
ELECT CAUTERY BLADE TIP 2.5 (TIP) ×1
ELECT REM PT RETURN 9FT ADLT (ELECTROSURGICAL) ×1
ELECTRODE CAUTERY BLDE TIP 2.5 (TIP) ×1 IMPLANT
ELECTRODE REM PT RTRN 9FT ADLT (ELECTROSURGICAL) ×1 IMPLANT
GLOVE SURG SYN 7.0 (GLOVE) ×2 IMPLANT
GLOVE SURG SYN 7.0 PF PI (GLOVE) ×2 IMPLANT
GLOVE SURG SYN 7.5  E (GLOVE) ×2
GLOVE SURG SYN 7.5 E (GLOVE) ×2 IMPLANT
GLOVE SURG SYN 7.5 PF PI (GLOVE) ×2 IMPLANT
GOWN STRL REUS W/ TWL LRG LVL3 (GOWN DISPOSABLE) ×3 IMPLANT
GOWN STRL REUS W/TWL LRG LVL3 (GOWN DISPOSABLE) ×3
GRASPER SUT TROCAR 14GX15 (MISCELLANEOUS) ×1 IMPLANT
IRRIGATION STRYKERFLOW (MISCELLANEOUS) IMPLANT
IRRIGATOR STRYKERFLOW (MISCELLANEOUS)
IV NS 1000ML (IV SOLUTION)
IV NS 1000ML BAXH (IV SOLUTION) IMPLANT
KIT PINK PAD W/HEAD ARE REST (MISCELLANEOUS) ×1
KIT PINK PAD W/HEAD ARM REST (MISCELLANEOUS) ×1 IMPLANT
LABEL OR SOLS (LABEL) ×1 IMPLANT
MANIFOLD NEPTUNE II (INSTRUMENTS) ×1 IMPLANT
MESH VENT LT ST 15CM CRL ECHO2 (Mesh General) IMPLANT
MESH VENTRALIGHT ST 4.5 ECHO (Mesh General) IMPLANT
NDL INSUFFLATION 14GA 120MM (NEEDLE) ×1 IMPLANT
NEEDLE HYPO 22GX1.5 SAFETY (NEEDLE) ×1 IMPLANT
NEEDLE INSUFFLATION 14GA 120MM (NEEDLE) ×1 IMPLANT
OBTURATOR OPTICAL STANDARD 8MM (TROCAR) ×1
OBTURATOR OPTICAL STND 8 DVNC (TROCAR) ×1
OBTURATOR OPTICALSTD 8 DVNC (TROCAR) ×1 IMPLANT
PACK LAP CHOLECYSTECTOMY (MISCELLANEOUS) ×1 IMPLANT
PENCIL SMOKE EVACUATOR (MISCELLANEOUS) ×1 IMPLANT
SEAL CANN UNIV 5-8 DVNC XI (MISCELLANEOUS) ×2 IMPLANT
SEAL XI 5MM-8MM UNIVERSAL (MISCELLANEOUS) ×2
SET TUBE FILTERED XL AIRSEAL (SET/KITS/TRAYS/PACK) IMPLANT
SET TUBE SMOKE EVAC HIGH FLOW (TUBING) ×1 IMPLANT
SOL ELECTROSURG ANTI STICK (MISCELLANEOUS) ×1
SOLUTION ELECTROSURG ANTI STCK (MISCELLANEOUS) ×1 IMPLANT
SPONGE T-LAP 18X18 ~~LOC~~+RFID (SPONGE) ×1 IMPLANT
STAPLER CANNULA SEAL DVNC XI (STAPLE) ×1 IMPLANT
STAPLER CANNULA SEAL XI (STAPLE) ×1
SUT MNCRL 4-0 (SUTURE) ×2
SUT MNCRL 4-0 27XMFL (SUTURE) ×2
SUT STRATAFIX PDS 30 CT-1 (SUTURE) ×1 IMPLANT
SUT VIC AB 3-0 SH 27 (SUTURE) ×1
SUT VIC AB 3-0 SH 27X BRD (SUTURE) IMPLANT
SUT VICRYL 0 UR6 27IN ABS (SUTURE) ×2 IMPLANT
SUT VLOC 90 2/L VL 12 GS22 (SUTURE) ×2 IMPLANT
SUTURE MNCRL 4-0 27XMF (SUTURE) ×1 IMPLANT
TAPE TRANSPORE STRL 2 31045 (GAUZE/BANDAGES/DRESSINGS) ×1 IMPLANT
TRAP FLUID SMOKE EVACUATOR (MISCELLANEOUS) ×1 IMPLANT
TRAY FOLEY SLVR 16FR LF STAT (SET/KITS/TRAYS/PACK) ×1 IMPLANT
WATER STERILE IRR 500ML POUR (IV SOLUTION) ×1 IMPLANT

## 2022-12-02 NOTE — Anesthesia Postprocedure Evaluation (Signed)
Anesthesia Post Note  Patient: [name]  Procedure(s) Performed: XI ROBOTIC ASSISTED VENTRAL HERNIA, incisional hernia  Patient location during evaluation: PACU Anesthesia Type: General Level of consciousness: awake and alert Pain management: pain level controlled Vital Signs Assessment: post-procedure vital signs reviewed and stable Respiratory status: spontaneous breathing, nonlabored ventilation, respiratory function stable and patient connected to nasal cannula oxygen Cardiovascular status: blood pressure returned to baseline and stable Postop Assessment: no apparent nausea or vomiting Anesthetic complications: no   No notable events documented.   Last Vitals:  Vitals:   12/02/22 1139 12/02/22 1238  BP: (!) 147/89 125/83  Pulse: 88 95  Resp: 18 16  Temp: 36.6 C   SpO2: 94% 94%    Last Pain:  Vitals:   12/02/22 1238  TempSrc:   PainSc: 0-No pain                 Precious Haws Joshual Terrio

## 2022-12-02 NOTE — Op Note (Signed)
  Procedure Date:  12/02/2022  Pre-operative Diagnosis:  Incisional epigastric hernia  Post-operative Diagnosis: Incisional epigastric hernia, 2.5 cm.  Procedure:  Robotic assisted Incisional epigastric Hernia Repair with mesh  Surgeon:  Melvyn Neth, MD  Anesthesia:  General endotracheal  Estimated Blood Loss:  10 ml  Specimens:  None  Complications:  None  Indications for Procedure:  This is a 37 y.o. male who presents with an incisional epigastric hernia s/p prior laparoscopic cholecystectomy.  The options of surgery versus observation were reviewed with the patient and/or family. The risks of bleeding, abscess or infection, recurrence of symptoms, potential for an open procedure, injury to surrounding structures, and chronic pain were all discussed with the patient and was willing to proceed.  Description of Procedure: The patient was correctly identified in the preoperative area and brought into the operating room.  The patient was placed supine with VTE prophylaxis in place.  Appropriate time-outs were performed.  Anesthesia was induced and the patient was intubated.  Appropriate antibiotics were infused.  The abdomen was prepped and draped in a sterile fashion. The patient's hernia defect was marked with a marking pen.  A Veress needle was introduced in the left upper quadrant and pneumoperitoneum was obtained with appropriate pressures.  Using Optiview technique, an 8 mm port was introduced in the left lateral abdominal wall without complications.  Then, a 12 mm port was introduced in the left upper quadrant and an 8 mm port in the left lower quadrant under direct visualization.  The DaVinci platform was docked, camera targeted, and instruments placed under direct visualization.  The patient's hernia was fully reduced and the peritoneum and preperitoneal fat were dissected and resected to allow better exposure of the hernia defect and for better mesh placement.  The hernia defect  measured 2.5 cm.  A 4.5 inch Bard Ventralight ST Echo mesh, a 0 Stratafix suture, and three 2-0 V-loc sutures were inserted through the 12 mm port under direct visualization.The hernia defect was closed using the stratafix suture.  A PMI was brought through the center of the hernia defect and the positioning system of the mesh was passed through.  This allowed the mesh to splay open and be centered over the repair site with good overlap.  The mesh was then sutured in place circumferentially and through the center of the mesh using the V-loc sutures.  All needles and the positioning system were then removed through the 12 mm port without complications.  The preperitoneal fat was placed in an Endocatch bag.  The DaVinci platform was then undocked and instruments removed.    60 ml of Exparel solution mixed with 0.5% bupivacaine with epi was infiltrated around the mesh edges, hernia repair site, and port sites.  The 12 mm port was removed and the bag retrieved.  The fascia was closed under direct visualization utilizing an Endo Close technique with 0 Vicryl suture.  The 8 mm ports were removed. The 12 mm incision was closed using 3-0 Vicryl and 4-0 Monocryl, and the other port incisions were closed with 4-0 Monocryl.  The wounds were cleaned and sealed with DermaBond.  The patient was emerged from anesthesia and extubated and brought to the recovery room for further management.  The patient tolerated the procedure well and all counts were correct at the end of the case.   Melvyn Neth, MD

## 2022-12-02 NOTE — Discharge Instructions (Addendum)
Discharge Instructions: 1.  Patient may shower, but do not scrub wounds heavily and dab dry only. 2.  Do not submerge wounds in pool/tub until fully healed. 3.  Do not apply ointments or hydrogen peroxide to the wounds. 4.  May apply ice packs to the wounds for comfort. 5.  Do not take Naproxen and Ibuprofen at the same time.  Hold the Naproxen while taking Ibuprofen. 6.  Do not drive while taking narcotics for pain control.  Prior to driving, make sure you are able to rotate right and left to look at blindspots without significant pain or discomfort. 7.  No heavy lifting or pushing of more than 10-15 lbs for 6 weeks.      AMBULATORY SURGERY  DISCHARGE INSTRUCTIONS   The drugs that you were given will stay in your system until tomorrow so for the next 24 hours you should not:  Drive an automobile Make any legal decisions Drink any alcoholic beverage   You may resume regular meals tomorrow.  Today it is better to start with liquids and gradually work up to solid foods.  You may eat anything you prefer, but it is better to start with liquids, then soup and crackers, and gradually work up to solid foods.   Please notify your doctor immediately if you have any unusual bleeding, trouble breathing, redness and pain at the surgery site, drainage, fever, or pain not relieved by medication.     Your post-operative visit with Dr.                                       is: Date:                        Time:    Please call to schedule your post-operative visit.  Additional Instructions:

## 2022-12-02 NOTE — H&P (Signed)
History of Present Illness: Gerald [name] is a 37 y.o. male presenting for follow-up of an incisional hernia.  He was last seen on 08/04/2022 and surgery had been scheduled for 08/26/2022.  However his request surgery was canceled.  The patient reported that his insurance would not cover his surgery.  Today, he presents to discuss surgery further.  He reports that he has changed his insurance companies and now his surgery would be able to get covered.  He denies any new or worsening symptoms from his incisional hernia still reports the discomfort in the upper abdomen when the bulges and a sense of annoyance at the hernia itself.  Denies any episodes where the hernia is not reducible or firm or very tender.  He is wondering what the appropriate timing for surgery is.  He reports that he has some regional meetings as part of his work that he has to attend one of which is in January and the other one in March and his wife also has a procedure next month.   Past Medical History:     Past Medical History:  Diagnosis Date   Anxiety     Complication of anesthesia     Difficult intubation     Headache      migraiones   Lyme disease     Viral URI with cough 06/07/2013      Past Surgical History:      Past Surgical History:  Procedure Laterality Date   CHOLECYSTECTOMY N/A 04/08/2016    Procedure: LAPAROSCOPIC CHOLECYSTECTOMY WITH INTRAOPERATIVE CHOLANGIOGRAM;  Surgeon: Clayburn Pert, MD;  Location: [facility] ORS;  Service: General;  Laterality: N/A;   TONSILLECTOMY AND ADENOIDECTOMY   ~1991      Home Medications:        Prior to Admission medications   Medication Sig Start Date End Date Taking? Authorizing Provider  amphetamine-dextroamphetamine (ADDERALL XR) 10 MG 24 hr capsule Take 1 capsule (10 mg total) by mouth daily. 09/30/22   Yes Pleas Koch, NP  losartan-hydrochlorothiazide (HYZAAR) 100-25 MG tablet Take 1 tablet by mouth daily. for blood pressure. 09/29/22   Yes Pleas Koch,  NP  Melatonin 10 MG TABS Take 10 mg by mouth.     Yes [provider]  naproxen sodium (ALEVE) 220 MG tablet Take 220 mg by mouth daily as needed.     Yes [provider]  omeprazole (PRILOSEC) 20 MG capsule TAKE 1 CAPSULE BY MOUTH EVERY DAY for heartburn. 04/22/21   Yes Pleas Koch, NP  venlafaxine XR (EFFEXOR-XR) 75 MG 24 hr capsule TAKE 1 CAPSULE BY MOUTH DAILY WITH BREAKFAST FOR ANXIETY OR DEPRESSION 07/25/22   Yes Pleas Koch, NP      Allergies:      Allergies  Allergen Reactions   Amoxicillin Hives      Has patient had a PCN reaction causing immediate rash, facial/tongue/throat swelling, SOB or lightheadedness with hypotension: Yes Has patient had a PCN reaction causing severe rash involving mucus membranes or skin necrosis: Yes Has patient had a PCN reaction that required hospitalization No Has patient had a PCN reaction occurring within the last 10 years: No If all of the above answers are "NO", then may proceed with Cephalosporin use.        Review of Systems: Review of Systems  Constitutional:  Negative for chills and fever.  HENT:  Negative for hearing loss.   Respiratory:  Negative for shortness of breath.   Cardiovascular:  Negative for chest pain.  Gastrointestinal:  Positive for abdominal pain. Negative for nausea and vomiting.  Genitourinary:  Negative for dysuria.  Musculoskeletal:  Negative for myalgias.  Skin:  Negative for rash.  Neurological:  Negative for dizziness.  Psychiatric/Behavioral:  Negative for depression.       Physical Exam BP 138/88   Pulse (!) 121   Temp 97.8 F (36.6 C) (Oral)   Ht 6' 1"$  (1.854 m)   Wt (!) 314 lb 3.2 oz (142.5 kg)   SpO2 96%   BMI 41.45 kg/m  CONSTITUTIONAL: No acute distress. HEENT:  Normocephalic, atraumatic, extraocular motion intact. NECK: Trachea is midline, no jugular venous distention. RESPIRATORY:  Lungs are clear, and breath sounds are equal bilaterally. Normal respiratory  effort without pathologic use of accessory muscles. CARDIOVASCULAR: Heart is regular without murmurs, gallops, or rubs. GI: The abdomen is soft, nondistended, with some discomfort to palpation in the epigastric area at the site of his incisional hernia.  The hernia itself is reducible.  Defect size itself is hard to determine given body habitus.  All of his other incisions are well-healed.  MUSCULOSKELETAL: Normal gait, no peripheral edema NEUROLOGIC:  Motor and sensation is grossly normal.  Cranial nerves are grossly intact. PSYCH:  Alert and oriented to person, place and time. Affect is normal.   Labs/Imaging: Labs from 08/30/2022: Sodium 140, potassium 3.9, chloride 103, CO2 29, BUN 12, creatinine 0.93.  Hemoglobin A1c 5.9   Assessment and Plan: This is a 37 y.o. male with an incisional hernia.   - Discussed with patient that his hernia appears to be stable and is still reducible.  Discussed with him that we can proceed with the same plan for robotic assisted incisional hernia repair.  Reviewed the surgery at length with him again including the planned incisions, risks of bleeding, infection, injury to surrounding structures, that this would be an outpatient procedure, the use of mesh to reinforce the repair, postoperative activity restrictions, pain control, and he is willing to proceed. - He is scheduled for robotic assisted incisional hernia repair on 12/02/22 -All of his questions have been answered.    Melvyn Neth, [facility]

## 2022-12-02 NOTE — Anesthesia Preprocedure Evaluation (Signed)
Anesthesia Evaluation  Patient identified by MRN, date of birth, ID band Patient awake    Reviewed: Allergy & Precautions, NPO status , Patient's Chart, lab work & pertinent test results  History of Anesthesia Complications Negative for: history of anesthetic complications  Airway Mallampati: III  TM Distance: <3 FB Neck ROM: full    Dental  (+) Chipped   Pulmonary sleep apnea    Pulmonary exam normal        Cardiovascular Exercise Tolerance: Good hypertension, (-) angina (-) Past MI and (-) DOE Normal cardiovascular exam     Neuro/Psych  Headaches PSYCHIATRIC DISORDERS         GI/Hepatic negative GI ROS, Neg liver ROS,GERD  Controlled,,  Endo/Other  negative endocrine ROS    Renal/GU      Musculoskeletal   Abdominal   Peds  Hematology negative hematology ROS (+)   Anesthesia Other Findings Past Medical History: No date: ADHD (attention deficit hyperactivity disorder) No date: Anxiety No date: GERD (gastroesophageal reflux disease) No date: Headache     Comment:  migraiones No date: Hypertension No date: Lyme disease No date: Mixed hyperlipidemia No date: Sleep apnea No date: Ventral hernia 06/07/2013: Viral URI with cough  Past Surgical History: 04/08/2016: CHOLECYSTECTOMY; N/A     Comment:  Procedure: LAPAROSCOPIC CHOLECYSTECTOMY WITH               INTRAOPERATIVE CHOLANGIOGRAM;  Surgeon: Clayburn Pert,               MD;  Location: [facility] ORS;  Service: General;  Laterality:               N/A; ~1991: TONSILLECTOMY AND ADENOIDECTOMY  BMI    Body Mass Index: 41.45 kg/m      Reproductive/Obstetrics negative OB ROS                             Anesthesia Physical Anesthesia Plan  ASA: 3  Anesthesia Plan: General ETT   Post-op Pain Management:    Induction: Intravenous  PONV Risk Score and Plan: Ondansetron, Dexamethasone, Midazolam and Treatment may vary due to age or  medical condition  Airway Management Planned: Oral ETT and Video Laryngoscope Planned  Additional Equipment:   Intra-op Plan:   Post-operative Plan: Extubation in OR  Informed Consent: I have reviewed the patients History and Physical, chart, labs and discussed the procedure including the risks, benefits and alternatives for the proposed anesthesia with the patient or authorized representative who has indicated his/her understanding and acceptance.     Dental Advisory Given  Plan Discussed with: Anesthesiologist, CRNA and Surgeon  Anesthesia Plan Comments: (Patient consented for risks of anesthesia including but not limited to:  - adverse reactions to medications - damage to eyes, teeth, lips or other oral mucosa - nerve damage due to positioning  - sore throat or hoarseness - Damage to heart, brain, nerves, lungs, other parts of body or loss of life  Patient voiced understanding.)       Anesthesia Quick Evaluation

## 2022-12-02 NOTE — Anesthesia Procedure Notes (Signed)
Procedure Name: Intubation Date/Time: 12/02/2022 7:35 AM  Performed by: Chanetta Marshall, CRNAPre-anesthesia Checklist: Patient identified, Emergency Drugs available, Suction available and Patient being monitored Patient Re-evaluated:Patient Re-evaluated prior to induction Oxygen Delivery Method: Circle system utilized Preoxygenation: Pre-oxygenation with 100% oxygen Induction Type: IV induction Ventilation: Mask ventilation without difficulty Laryngoscope Size: McGraph and 4 Grade View: Grade II Tube type: Oral Tube size: 7.5 mm Number of attempts: 1 Airway Equipment and Method: Stylet, Oral airway and Video-laryngoscopy Placement Confirmation: ETT inserted through vocal cords under direct vision, positive ETCO2, breath sounds checked- equal and bilateral and CO2 detector Secured at: 21 cm Tube secured with: Tape Dental Injury: Teeth and Oropharynx as per pre-operative assessment

## 2022-12-02 NOTE — Transfer of Care (Signed)
Immediate Anesthesia Transfer of Care Note  Patient: Gerald [name]  Procedure(s) Performed: XI ROBOTIC ASSISTED VENTRAL HERNIA, incisional hernia  Patient Location: PACU  Anesthesia Type:General  Level of Consciousness: awake, alert , and oriented  Airway & Oxygen Therapy: Patient Spontanous Breathing and Patient connected to face mask oxygen  Post-op Assessment: Report given to RN and Post -op Vital signs reviewed and stable  Post vital signs: Reviewed and stable  Last Vitals:  Vitals Value Taken Time  BP    Temp    Pulse    Resp    SpO2      Last Pain:  Vitals:   12/02/22 0659  TempSrc: Temporal  PainSc: 0-No pain         Complications: No notable events documented.

## 2022-12-06 ENCOUNTER — Telehealth: Payer: Self-pay

## 2022-12-06 NOTE — Telephone Encounter (Signed)
-----   Message from Flora Lipps, MD sent at 12/06/2022  9:12 AM EST ----- Hello  HST shows DIAGNOSIS OF  OBSTRUCTIVE SLEEP APNEA   TRY AUTOCPAP 5-12 cm h20 DME referral as well  ----- Message ----- From: Vivia Budge Sent: 11/22/2022   1:29 PM EST To: Flora Lipps, MD

## 2022-12-06 NOTE — Telephone Encounter (Signed)
Lm for patient.  

## 2022-12-07 NOTE — Telephone Encounter (Signed)
Lm x2 for patient.  Letter mailed to address on file.  Will close encounter per office protocol.

## 2022-12-08 ENCOUNTER — Telehealth: Payer: Self-pay | Admitting: Internal Medicine

## 2022-12-08 DIAGNOSIS — G4733 Obstructive sleep apnea (adult) (pediatric): Secondary | ICD-10-CM

## 2022-12-08 NOTE — Telephone Encounter (Signed)
Please see last signed encounter and try again w/a call back regarding results. PT had surgery and a hard time getting his calls.  His # [phone]

## 2022-12-08 NOTE — Telephone Encounter (Signed)
-----   Message from Flora Lipps, MD sent at 12/06/2022  9:12 AM EST ----- Hello   HST shows DIAGNOSIS OF  OBSTRUCTIVE SLEEP APNEA    TRY AUTOCPAP 5-12 cm h20 DME referral as well      I notified the patient and I have placed the order for the CPAP machine.  Nothing further needed.

## 2022-12-14 ENCOUNTER — Other Ambulatory Visit: Payer: Self-pay | Admitting: Primary Care

## 2022-12-14 ENCOUNTER — Telehealth: Payer: Self-pay | Admitting: *Deleted

## 2022-12-14 DIAGNOSIS — I10 Essential (primary) hypertension: Secondary | ICD-10-CM

## 2022-12-14 NOTE — Telephone Encounter (Signed)
Patient would like to get a return to work note sent to his [name], he returned on 12/09/22 and its needs to say light duty for 6 weeks total  He had surgery on 12/02/22 Dr Hampton Abbot ventral hernia

## 2022-12-24 ENCOUNTER — Encounter: Payer: Self-pay | Admitting: Surgery

## 2022-12-24 ENCOUNTER — Ambulatory Visit (INDEPENDENT_AMBULATORY_CARE_PROVIDER_SITE_OTHER): Payer: BC Managed Care – PPO | Admitting: Surgery

## 2022-12-24 ENCOUNTER — Telehealth: Payer: Self-pay

## 2022-12-24 VITALS — BP 145/78 | HR 98 | Temp 98.3°F | Ht 71.0 in | Wt 315.0 lb

## 2022-12-24 DIAGNOSIS — K432 Incisional hernia without obstruction or gangrene: Secondary | ICD-10-CM

## 2022-12-24 DIAGNOSIS — Z09 Encounter for follow-up examination after completed treatment for conditions other than malignant neoplasm: Secondary | ICD-10-CM | POA: Diagnosis not present

## 2022-12-24 NOTE — Progress Notes (Signed)
12/24/2022  History of Present Illness: Gerald [name] is a 37 y.o. male status post robotic assisted incisional hernia repair on 12/02/2022 for epigastric incisional hernia.  Hernia defect was 2.5 cm in length.  He presents today for follow-up.  He reports that he has not needed narcotics for pain control and that the majority of the discomfort went away after the first few days after surgery.  He does report 1 area of numbness that is between the 2 left-sided upper incisions.  Past Medical History: Past Medical History:  Diagnosis Date   ADHD (attention deficit hyperactivity disorder)    Anxiety    GERD (gastroesophageal reflux disease)    Headache    migraiones   Hypertension    Lyme disease    Mixed hyperlipidemia    Sleep apnea    Ventral hernia    Viral URI with cough 06/07/2013     Past Surgical History: Past Surgical History:  Procedure Laterality Date   CHOLECYSTECTOMY N/A 04/08/2016   Procedure: LAPAROSCOPIC CHOLECYSTECTOMY WITH INTRAOPERATIVE CHOLANGIOGRAM;  Surgeon: Clayburn Pert, MD;  Location: [facility] ORS;  Service: General;  Laterality: N/A;   TONSILLECTOMY AND ADENOIDECTOMY  ~1991   XI ROBOTIC ASSISTED VENTRAL HERNIA N/A 12/02/2022   Procedure: XI ROBOTIC ASSISTED VENTRAL HERNIA, incisional hernia;  Surgeon: Olean Ree, MD;  Location: [facility] ORS;  Service: General;  Laterality: N/A;    Home Medications: Prior to Admission medications   Medication Sig Start Date End Date Taking? Authorizing Provider  acetaminophen (TYLENOL) 500 MG tablet Take 2 tablets (1,000 mg total) by mouth every 6 (six) hours as needed for mild pain. 12/02/22  Yes Gayland Nicol, Jacqulyn Bath, MD  amphetamine-dextroamphetamine (ADDERALL XR) 15 MG 24 hr capsule Take 1 capsule by mouth every morning. 11/05/22  Yes Pleas Koch, NP  ibuprofen (ADVIL) 800 MG tablet Take 1 tablet (800 mg total) by mouth every 8 (eight) hours as needed for moderate pain. 12/02/22  Yes Artesha Wemhoff, Jacqulyn Bath, MD  losartan-hydrochlorothiazide  (HYZAAR) 100-25 MG tablet Take 1 tablet by mouth daily. for blood pressure. Patient taking differently: Take 1 tablet by mouth every morning. for blood pressure. 09/29/22  Yes Pleas Koch, NP  Melatonin 10 MG TABS Take 10 mg by mouth at bedtime.   Yes [provider]  naproxen sodium (ALEVE) 220 MG tablet Take 220 mg by mouth daily as needed.   Yes [provider]  omeprazole (PRILOSEC) 20 MG capsule TAKE 1 CAPSULE BY MOUTH EVERY DAY for heartburn. Patient taking differently: 20 mg as needed. TAKE 1 CAPSULE BY MOUTH EVERY DAY for heartburn. 04/22/21  Yes Pleas Koch, NP  ondansetron (ZOFRAN) 4 MG tablet Take 1 tablet (4 mg total) by mouth every 8 (eight) hours as needed for nausea or vomiting. 12/02/22 12/02/23 Yes Howard Bunte, MD  venlafaxine XR (EFFEXOR-XR) 75 MG 24 hr capsule Take 1 capsule (75 mg total) by mouth daily with breakfast. for anxiety and depression. 10/27/22  Yes Pleas Koch, NP    Allergies: Allergies  Allergen Reactions   Amoxicillin Hives    Has patient had a PCN reaction causing immediate rash, facial/tongue/throat swelling, SOB or lightheadedness with hypotension: Yes Has patient had a PCN reaction causing severe rash involving mucus membranes or skin necrosis: Yes Has patient had a PCN reaction that required hospitalization No Has patient had a PCN reaction occurring within the last 10 years: No If all of the above answers are "NO", then may proceed with Cephalosporin use.     Review of  Systems: Review of Systems  Constitutional:  Negative for chills and fever.  Respiratory:  Negative for shortness of breath.   Cardiovascular:  Negative for chest pain.  Gastrointestinal:  Positive for abdominal pain (soreness at incisions). Negative for nausea and vomiting.    Physical Exam BP (!) 145/78   Pulse 98   Temp 98.3 F (36.8 C)   Ht 5\' 11"  (1.803 m)   Wt (!) 315 lb (142.9 kg)   SpO2 97%   BMI 43.93 kg/m  CONSTITUTIONAL: No  acute distress HEENT:  Normocephalic, atraumatic, extraocular motion intact. RESPIRATORY:  Normal respiratory effort without pathologic use of accessory muscles. CARDIOVASCULAR: Regular rhythm and rate. GI: The abdomen is soft, nondistended, with some soreness to palpation at the incisions and epigastric area.  Incisions are healing well and are clean, dry, intact.  No evidence of infection.  No evidence of recurrent hernia.  The patient does have 1 area of numbness that is medial to the left upper quadrant incision between the 2 upper incisions on the left side. NEUROLOGIC:  Motor and sensation is grossly normal.  Cranial nerves are grossly intact. PSYCH:  Alert and oriented to person, place and time. Affect is normal.   Assessment and Plan: This is a 37 y.o. male status post robotic assisted incisional hernia repair.  - Discussed with patient that overall he is feeling well and incisions are healing appropriately.  Discussed with him that the numbness may be related to the incisions as we are cutting through the microscopic nerves that supply sensation to the skin.  Discussed with him that although this could regenerate to an extent, there is more likely to be some degree of numbness going forwards.  Discussed with him that these nerves are strictly sensory and will not be affecting any of the motor function of the muscles underneath that area. - Discussed with him activity restrictions. - Follow-up as needed.  I spent 20 minutes dedicated to the care of this patient on the date of this encounter to include pre-visit review of records, face-to-face time with the patient discussing diagnosis and management, and any post-visit coordination of care.   Melvyn Neth, [facility]

## 2022-12-24 NOTE — Telephone Encounter (Signed)
Patient has not been setup on cpap yet. He is scheduled for setup on 12/28/2022. Appt schedule with Dr. Mortimer Fries for 12/27/2022.  Lm for patient to ask if he would like to postpone appt until after setup on cpap.

## 2022-12-24 NOTE — Patient Instructions (Signed)
GENERAL POST-OPERATIVE PATIENT INSTRUCTIONS   WOUND CARE INSTRUCTIONS:  Try to keep the wound dry and avoid ointments on the wound unless directed to do so.  If the wound becomes bright red and painful or starts to drain infected material that is not clear, please contact your physician immediately.  If the wound is mildly pink and has a thick firm ridge underneath it, this is normal, and is referred to as a healing ridge.  This will resolve over the next 4-6 weeks.  BATHING: You may shower if you have been informed of this by your surgeon. However, Please do not submerge in a tub, hot tub, or pool until incisions are completely sealed or have been told by your surgeon that you may do so.  DIET:  You may eat any foods that you can tolerate.  It is a good idea to eat a high fiber diet and take in plenty of fluids to prevent constipation.  If you do become constipated you may want to take a mild laxative or take ducolax tablets on a daily basis until your bowel habits are regular.  Constipation can be very uncomfortable, along with straining, after recent surgery.  ACTIVITY:  You are encouraged to walk and engage in light activity for the next two weeks.  You should not lift more than 20 pounds for 6 weeks total after surgery as it could put you at increased risk for complications.  Twenty pounds is roughly equivalent to a plastic bag of groceries. At that time- Listen to your body when lifting, if you have pain when lifting, stop and then try again in a few days. Soreness after doing exercises or activities of daily living is normal as you get back in to your normal routine.  MEDICATIONS:  Try to take narcotic medications and anti-inflammatory medications, such as tylenol, ibuprofen, naprosyn, etc., with food.  This will minimize stomach upset from the medication.  Should you develop nausea and vomiting from the pain medication, or develop a rash, please discontinue the medication and contact your  physician.  You should not drive, make important decisions, or operate machinery when taking narcotic pain medication.  SUNBLOCK Use sun block to incision area over the next year if this area will be exposed to sun. This helps decrease scarring and will allow you avoid a permanent darkened area over your incision.  QUESTIONS:  Please feel free to call our office if you have any questions, and we will be glad to assist you. [phone]   

## 2022-12-27 ENCOUNTER — Encounter: Payer: Self-pay | Admitting: Internal Medicine

## 2022-12-27 ENCOUNTER — Encounter: Payer: BC Managed Care – PPO | Admitting: Internal Medicine

## 2022-12-27 NOTE — Telephone Encounter (Signed)
Appt rescheduled

## 2022-12-30 ENCOUNTER — Other Ambulatory Visit: Payer: Self-pay | Admitting: Primary Care

## 2022-12-30 DIAGNOSIS — I10 Essential (primary) hypertension: Secondary | ICD-10-CM

## 2022-12-31 DIAGNOSIS — G4733 Obstructive sleep apnea (adult) (pediatric): Secondary | ICD-10-CM | POA: Diagnosis not present

## 2023-01-18 ENCOUNTER — Other Ambulatory Visit: Payer: Self-pay | Admitting: Primary Care

## 2023-01-18 DIAGNOSIS — F900 Attention-deficit hyperactivity disorder, predominantly inattentive type: Secondary | ICD-10-CM

## 2023-01-18 MED ORDER — AMPHETAMINE-DEXTROAMPHET ER 15 MG PO CP24: 15.0000 mg | ORAL_CAPSULE | ORAL | 0 refills | Status: DC

## 2023-01-18 NOTE — Telephone Encounter (Signed)
From: Gerrie Nordmann To: Office of Doreene Nest, NP Sent: 01/18/2023 12:06 PM EDT Subject: Medication Renewal Request  Refills have been requested for the following medications:   amphetamine-dextroamphetamine (ADDERALL XR) 15 MG 24 hr capsule [Gerald [name]]  Preferred pharmacy: [id number] [place] Rough, [place] - [address] AT [address] OF [address] Delivery method: [employer]

## 2023-01-31 DIAGNOSIS — G4733 Obstructive sleep apnea (adult) (pediatric): Secondary | ICD-10-CM | POA: Diagnosis not present

## 2023-02-03 ENCOUNTER — Encounter: Payer: Self-pay | Admitting: Cardiology

## 2023-02-03 ENCOUNTER — Ambulatory Visit: Payer: BC Managed Care – PPO | Attending: Cardiology | Admitting: Cardiology

## 2023-02-03 VITALS — BP 140/88 | HR 107 | Ht 69.0 in | Wt 329.0 lb

## 2023-02-03 DIAGNOSIS — I10 Essential (primary) hypertension: Secondary | ICD-10-CM

## 2023-02-03 DIAGNOSIS — R000 Tachycardia, unspecified: Secondary | ICD-10-CM

## 2023-02-03 DIAGNOSIS — R079 Chest pain, unspecified: Secondary | ICD-10-CM | POA: Diagnosis not present

## 2023-02-03 DIAGNOSIS — E782 Mixed hyperlipidemia: Secondary | ICD-10-CM

## 2023-02-03 DIAGNOSIS — Z6841 Body Mass Index (BMI) 40.0 and over, adult: Secondary | ICD-10-CM

## 2023-02-03 DIAGNOSIS — G4733 Obstructive sleep apnea (adult) (pediatric): Secondary | ICD-10-CM

## 2023-02-03 DIAGNOSIS — R0609 Other forms of dyspnea: Secondary | ICD-10-CM

## 2023-02-03 MED ORDER — LOSARTAN POTASSIUM-HCTZ 100-25 MG PO TABS: ORAL_TABLET | ORAL | 1 refills | Status: DC

## 2023-02-03 MED ORDER — DILTIAZEM HCL ER COATED BEADS 120 MG PO CP24: ORAL_CAPSULE | ORAL | 3 refills | Status: DC

## 2023-02-03 NOTE — Patient Instructions (Signed)
Medication Instructions:  Your physician has recommended you make the following change in your medication:   START - losartan-hydrochlorothiazide (HYZAAR) 100-25 MG tablet - Take 0.5 tablet by mouth daily for blood pressure in the morning  START - diltiazem (CARDIZEM CD) 120 MG 24 hr capsule  - Take 1 capsule (120 mg total) by mouth daily at night.  *If you need a refill on your cardiac medications before your next appointment, please call your pharmacy*  Lab Work: -None ordered If you have labs (blood work) drawn today and your tests are completely normal, you will receive your results only by: MyChart Message (if you have MyChart) OR A paper copy in the mail If you have any lab test that is abnormal or we need to change your treatment, we will call you to review the results.  Testing/Procedures: Your physician has requested that you have an echocardiogram. Echocardiography is a painless test that uses sound waves to create images of your heart. It provides your doctor with information about the size and shape of your heart and how well your heart's chambers and valves are working. This procedure takes approximately one hour. There are no restrictions for this procedure. Please do NOT wear cologne, perfume, aftershave, or lotions (deodorant is allowed). Please arrive 15 minutes prior to your appointment time.   Follow-Up: At [facility], you and your health needs are our priority.  As part of our continuing mission to provide you with exceptional heart care, we have created designated Provider Care Teams.  These Care Teams include your primary Cardiologist (physician) and Advanced Practice Providers (APPs -  Physician Assistants and Nurse Practitioners) who all work together to provide you with the care you need, when you need it.  We recommend signing up for the patient portal called "MyChart".  Sign up information is provided on this After Visit Summary.  MyChart is used to  connect with patients for Virtual Visits (Telemedicine).  Patients are able to view lab/test results, encounter notes, upcoming appointments, etc.  Non-urgent messages can be sent to your provider as well.   To learn more about what you can do with MyChart, go to [email or web address].    Your next appointment:   3 - 4 month(s)  Provider:   You may see Bryan Lemma, MD or one of the following Advanced Practice Providers on your designated Care Team:   Nicolasa Ducking, NP Eula Listen, PA-C Cadence Fransico Michael, PA-C Charlsie Quest, NP    Other Instructions -None

## 2023-02-03 NOTE — Progress Notes (Signed)
Primary Care Provider: Doreene Nest, NP [facility] Cardiologist: None Electrophysiologist: None Pulmonologist: Erin Fulling, MD   Clinic Note: Chief Complaint  Patient presents with   New Patient (Initial Visit)    Ref by Dr. Belia Heman for chest pain & shortness of breath. Patient was recently diagnose with sleep apnea and he's been wearing the CPAP x 1 month. Medications reviewed by the patient verbally.     ===================================  ASSESSMENT/PLAN   Problem List Items Addressed This Visit       Cardiology Problems   Mixed hyperlipidemia (Chronic)    Most recent lipids show improvement overall in total cholesterol and triglycerides but LDL still remains 133 and triglycerides still elevated.  With obesity and hypertension this meets criteria for metabolic syndrome.  Strongly recommend considering GLP-1 agonist such as Wegovy or Ozempic.   Will also discuss possibly restarting statin.      Relevant Medications   losartan-hydrochlorothiazide (HYZAAR) 100-25 MG tablet   diltiazem (CARDIZEM CD) 120 MG 24 hr capsule   Essential hypertension (Chronic)    Blood pressure is high here today, but with him telling me that he is not taking his losartan and HCTZ every day, I suspect that he is having some ups and downs.  With him having resting heart rate of 107 bpm and there being concern for potential pulm hypertension from OSA and can actually start calcium channel blocker for rate control.  As such we will reduce his losartan and HCTZ dose back to 50-12.5 mg.  Plan: STOP- losartan-hydrochlorothiazide (HYZAAR) 100-25 MG tablet -START taking 0.5 tablet by mouth daily for blood pressure in the morning START - diltiazem (CARDIZEM CD) 120 MG 24 hr capsule  - Take 1 capsule (120 mg total) by mouth daily at night.      Relevant Medications   losartan-hydrochlorothiazide (HYZAAR) 100-25 MG tablet   diltiazem (CARDIZEM CD) 120 MG 24 hr capsule     Other    Sinus tachycardia (Chronic)    Seems a chronic issue for him.  Likely related to obesity. With OSA there is concern for obesity hypoventilation syndrome and pulm hypertension.  Will add nondihydropyridine calcium channel blocker for rate control.  Plan: Start diltiazem CD1 20 mg daily with plans to titrate up further.  In doing so we will then decrease his losartan and HCTZ to 1/2 tablet daily.      OSA on CPAP    Certainly he is obese and has exertional dyspnea.  But overall since starting CPAP he is feels a whole lot better with better control blood pressures and improved energy.    With him having exertional dyspnea I suspect this related to obesity, but we can assess for pulmonary hypertension and cardiac function with 2D echo.  Plan: Check 2D echo      Morbid obesity with BMI of 40.0-44.9, adult (HCC) (Chronic)    The patient understands the need to lose weight with diet and exercise. We have discussed specific strategies for this.  I think that he may very well benefit from [facility] or Zepbound      DOE (dyspnea on exertion) (Chronic)    Probably mostly related to deconditioning and obesity, however with longstanding hypertension and tachycardia as well as OSA would need to exclude cardiac or pulmonary etiology.  For now we will evaluate with 2D echo and pending results consider the possibility of stress test evaluation versus coronary calcium score for risk stratification.  Plan: Check 2D echo  Chest pain of uncertain etiology - Primary (Chronic)    Not really having chest pain now.  We talked about whether we should do any type of ischemic evaluation on him and at this point he is not ready symptomatic.  He has more of a dyspnea concern than chest pain.  Plan is to check a 2D echocardiogram to assess EF and wall motion.  If that is abnormal then I think an ischemic evaluation is definitely warranted otherwise I would hold off.  We can consider coronary calcium scoring for  risk stratification.      Relevant Orders   EKG 12-Lead (Completed)   ECHOCARDIOGRAM COMPLETE    ===================================  HPI:    Gerald [name] is a morbidly obese 37 y.o. male with history of Hyperlipidemia (hypertriglyceridemia), anxiety-ADHD, migraine headaches (on propranolol) who is being seen today for the evaluation of Chest Pain and Shortness of Breath at the request of Dr. Belia Heman and Chestine Spore, Keane Scrape, NP.  Trestin [name] was seen via Telehealth visit by Dr. Mariah Milling May 2020 for CP/SOB -> referred by Dr. Chestine Spore for chest pain.  Began after lifting up his child to play airplane.  Once he put the child down, he started to have excruciating chest pain radiating to the back..  Notes that he is usually very active lawnmowing and building a house [place].  Did not notice any other symptoms with exertion.  Only with certain movements.  Likes to roughhouse with his kids.  (At that time his total cholesterol was 226 and triglycerides were 517, and A1c was 5.6. => Pain was felt to be mechanical musculoskeletal in nature likely rib pain.  Recommended icing NSAIDs etc.  He had not yet started his Lipitor at that time.  Was trying to make lifestyle modification.  Discussed lack of coronary calcification on CT scan back in 2017.  Therefore, did not check Coronary Calcium Score.  Recommended PRN follow-up.Marland [name]  Recent Hospitalizations:  12/02/2022: Incisional hernia repair.  He saw his PCP back in December 2023-noted to be hypertensive, on losartan-HCTZ was increased to 100-25 mg daily.  Recommended holding off on Adderall until blood pressure is better controlled.  He was seen by Dr. Jeralene Huff from pulmonary medicine on December 18 with an Epworth score of 12.  This was for evaluation of excessive daytime sleepiness.  He was scheduled for a sleep study. => The home sleep study did show OSA.  Recommendation was auto PAP CPAP 5-12 cm water.  Reviewed  CV studies:    The following studies were  reviewed today: (if available, images/films reviewed: From Epic Chart or Care Everywhere) Home Sleep Test 11/22/2022: Moderate Obstructive Sleep Apnea with AHI of 28.1 and SpO2 low of 64%. => Recommended weight loss, CPAP, oral appliance or surgical assessment. (Epworth Score - 12)  Interval History:   Kinan Safley presents here today a little bit unsure as to what he was being sent for but he said that Dr. Nicanor Bake wanted him evaluated.  He does not recall mentioning anything about chest discomfort at present he really has not had anything since 2020 he also notes that ever since he started on CPAP he is feeling a whole lot better in general.  Much more energy.  Much less dyspneic.  He does think that he was noticing a lot of dizzy exertional dyspnea prior to starting CPAP and this is definitely improved.  Every now that he has to stop and catch his breath for just seconds but he can keep going now  when he would usually have to stop for several minutes to catch his breath.  He denies any chest pain and significant improved exertional dyspnea.  No resting symptoms.  No PND, orthopnea or edema. He has occasional palpitations but just every now and then nothing prolonged no rapid irregular heartbeat symptoms to suggest arrhythmia.  He mostly notes just having a high resting heart rate for years. He also notes that since starting on CPAP his blood pressures have been better controlled and he is now only taking his losartan-HCTZ for 5 days a week because he had days but has been very dizzy and lightheaded.  He says that since stopping the daily dosing he has been feeling better now.  He stopped taking his Effexor or indicated that he felt overdosed felt he was just losing any sense of motion and felt more like a robot.  Since stopping that he feels much better but did note that his weight went up after he stopped.  He had been doing pretty well try to lose weight and gained a lot of it back.  CV Review of  Symptoms (Summary): positive for - dyspnea on exertion, palpitations, rapid heart rate, and couple of dizzy spells since starting CPAP - associated with taknig BP med; high resting HR - rare palpitations negative for - chest pain, edema, orthopnea, paroxysmal nocturnal dyspnea, shortness of breath, or syncope/near syncope, TIA/amaurosis fugax, claudications  REVIEWED OF SYSTEMS   Review of Systems  Constitutional:  Negative for weight loss (Gained ~20 lb -- had stopped Effexor & has had Sgx - no exercise.).  Respiratory:  Negative for cough and wheezing (sneezing when eats spicy foods).        Much better since CPAP  Cardiovascular:  Negative for leg swelling.  Gastrointestinal:  Negative for blood in stool and melena.  Genitourinary:  Negative for frequency and hematuria.  Musculoskeletal:  Positive for joint pain.  Neurological:  Positive for dizziness (with taking BP meds) and headaches (better since CPAP). Negative for tingling, focal weakness and weakness.  Psychiatric/Behavioral:  Positive for depression. Negative for memory loss. The patient is nervous/anxious. The patient does not have insomnia.        Better with Adderal - but has stopped Effexor - concerned about over-medication. Felt lack of emotion.     I have reviewed and (if needed) personally updated the patient's problem list, medications, allergies, past medical and surgical history, social and family history.   PAST MEDICAL HISTORY   Past Medical History:  Diagnosis Date   ADHD (attention deficit hyperactivity disorder)    Anxiety    GERD (gastroesophageal reflux disease)    Headache    migraiones   Hypertension    Lyme disease    Mixed hyperlipidemia    Obstructive sleep apnea on CPAP 11/2022   Home Sleep Test 11/22/2022: Moderate Obstructive Sleep Apnea with AHI of 28.1 and SpO2 low of 64%. => Recommended weight loss, CPAP, oral appliance or surgical assessment. (Epworth Score - 12)   Sinus tachycardia 02/04/2023    Ventral hernia    Viral URI with cough 06/07/2013    PAST SURGICAL HISTORY   Past Surgical History:  Procedure Laterality Date   CHOLECYSTECTOMY N/A 04/08/2016   Procedure: LAPAROSCOPIC CHOLECYSTECTOMY WITH INTRAOPERATIVE CHOLANGIOGRAM;  Surgeon: Ricarda Frame, MD;  Location: [facility] ORS;  Service: General;  Laterality: N/A;   TONSILLECTOMY AND ADENOIDECTOMY  ~1991   XI ROBOTIC ASSISTED VENTRAL HERNIA N/A 12/02/2022   Procedure: XI ROBOTIC ASSISTED VENTRAL HERNIA, incisional  hernia;  Surgeon: Henrene Dodge, MD;  Location: [facility] ORS;  Service: General;  Laterality: N/A;    MEDICATIONS/ALLERGIES   Current Meds  Medication Sig   acetaminophen (TYLENOL) 500 MG tablet Take 2 tablets (1,000 mg total) by mouth every 6 (six) hours as needed for mild pain.   amphetamine-dextroamphetamine (ADDERALL XR) 15 MG 24 hr capsule Take 1 capsule by mouth every morning.   ibuprofen (ADVIL) 800 MG tablet Take 1 tablet (800 mg total) by mouth every 8 (eight) hours as needed for moderate pain.   losartan-hydrochlorothiazide (HYZAAR) 100-25 MG tablet TAKE 1 TABLET BY MOUTH DAILY FOR BLOOD PRESSURE   Melatonin 10 MG TABS Take 10 mg by mouth at bedtime.   naproxen sodium (ALEVE) 220 MG tablet Take 220 mg by mouth daily as needed.   omeprazole (PRILOSEC) 20 MG capsule TAKE 1 CAPSULE BY MOUTH EVERY DAY for heartburn.    Allergies  Allergen Reactions   Amoxicillin Hives    Has patient had a PCN reaction causing immediate rash, facial/tongue/throat swelling, SOB or lightheadedness with hypotension: Yes Has patient had a PCN reaction causing severe rash involving mucus membranes or skin necrosis: Yes Has patient had a PCN reaction that required hospitalization No Has patient had a PCN reaction occurring within the last 10 years: No If all of the above answers are "NO", then may proceed with Cephalosporin use.     SOCIAL HISTORY/FAMILY HISTORY   Reviewed in Epic:   Social History   Tobacco Use   Smoking  status: Never    Passive exposure: Never   Smokeless tobacco: Never  Vaping Use   Vaping Use: Never used  Substance Use Topics   Alcohol use: Not Currently   Drug use: No   Social History   Social History Brewing technologist for [employer]   Married 2 kids, 3rd child died of hypoplastic L heart 2012/03/09   Family History  Problem Relation Age of Onset   Drug abuse Mother    Hypertension Mother    Mental illness Mother    Diabetes Father    Bipolar disorder Sister    Depression Brother   - No Heart Disease  OBJCTIVE -PE, EKG, labs   Wt Readings from Last 3 Encounters:  02/03/23 (!) 329 lb (149.2 kg)  12/24/22 (!) 315 lb (142.9 kg)  12/02/22 (!) 314 lb 2.5 oz (142.5 kg)   Physical Exam: BP (!) 140/88 (BP Location: Right Arm, Patient Position: Sitting, Cuff Size: Large)   Pulse (!) 107   Ht 5\' 9"  (1.753 m)   Wt (!) 329 lb (149.2 kg)   SpO2 97%   BMI 48.58 kg/m  Physical Exam Vitals reviewed.  Constitutional:      General: He is not in acute distress.    Appearance: Normal appearance. He is obese. He is not toxic-appearing or diaphoretic.     Comments: Morbidly obese but well-groomed.  HENT:     Head: Normocephalic and atraumatic.  Eyes:     Extraocular Movements: Extraocular movements intact.     Pupils: Pupils are equal, round, and reactive to light.  Neck:     Vascular: No carotid bruit or JVD.  Cardiovascular:     Rate and Rhythm: Normal rate and regular rhythm. No extrasystoles are present.    Chest Wall: PMI is not displaced (Unable to palpate).     Pulses: Intact distal pulses. Decreased pulses (More because of body habitus).     Heart sounds: S1 normal and  S2 normal. Heart sounds are distant.  Musculoskeletal:     Cervical back: Normal range of motion and neck supple.  Neurological:     Mental Status: He is alert.     Adult ECG Report  Rate: 107;  Rhythm: sinus tachycardia and RSR'-pulmonary pattern, borderline low voltage ;   Narrative  Interpretation: Relatively stable  Recent Labs:   Reviewed.  Improved since 2020, but still poorly controlled. Lab Results  Component Value Date   CHOL 209 (H) 08/30/2022   HDL 32.50 (L) 08/30/2022   LDLDIRECT 133.0 08/30/2022   TRIG 369.0 (H) 08/30/2022   CHOLHDL 6 08/30/2022   Lab Results  Component Value Date   CREATININE 0.97 11/24/2022   BUN 14 11/24/2022   NA 142 11/24/2022   K 3.6 11/24/2022   CL 105 11/24/2022   CO2 28 11/24/2022      Latest Ref Rng & Units 06/18/2022   11:27 AM 07/24/2019    4:20 PM 03/18/2016    7:42 AM  CBC  WBC 4.0 - 10.5 K/uL 11.1  12.2  11.0   Hemoglobin 13.0 - 17.0 g/dL 40.9  81.1  91.4   Hematocrit 39.0 - 52.0 % 45.1  44.6  41.8   Platelets 150.0 - 400.0 K/uL 311.0  316.0  249     Lab Results  Component Value Date   HGBA1C 5.9 08/30/2022   Lab Results  Component Value Date   TSH 2.61 06/18/2022    ================================================== I spent a total of 28 minutes with the patient spent in direct patient consultation.  Additional time spent with chart review  / charting (studies, outside notes, etc): 19 min Total Time: 47 min  Current medicines are reviewed at length with the patient today.  (+/- concerns) N/A  Notice: This dictation was prepared with Dragon dictation along with smart phrase technology. Any transcriptional errors that result from this process are unintentional and may not be corrected upon review.   Studies Ordered:  Orders Placed This Encounter  Procedures   EKG 12-Lead   ECHOCARDIOGRAM COMPLETE   Meds ordered this encounter  Medications   losartan-hydrochlorothiazide (HYZAAR) 100-25 MG tablet    Sig: for blood pressure in the morning    Dispense:  90 tablet    Refill:  1   diltiazem (CARDIZEM CD) 120 MG 24 hr capsule    Sig: Take 1 capsule (120 mg total) by mouth daily at night.    Dispense:  90 capsule    Refill:  3    Patient Instructions / Medication Changes & Studies & Tests Ordered    Patient Instructions  Medication Instructions:  Your physician has recommended you make the following change in your medication:   START - losartan-hydrochlorothiazide (HYZAAR) 100-25 MG tablet - Take 0.5 tablet by mouth daily for blood pressure in the morning  START - diltiazem (CARDIZEM CD) 120 MG 24 hr capsule  - Take 1 capsule (120 mg total) by mouth daily at night.  *If you need a refill on your cardiac medications before your next appointment, please call your pharmacy*  Lab Work: -None ordered If you have labs (blood work) drawn today and your tests are completely normal, you will receive your results only by: MyChart Message (if you have MyChart) OR A paper copy in the mail If you have any lab test that is abnormal or we need to change your treatment, we will call you to review the results.  Testing/Procedures: Your physician has requested that you have  an echocardiogram. Echocardiography is a painless test that uses sound waves to create images of your heart. It provides your doctor with information about the size and shape of your heart and how well your heart's chambers and valves are working. This procedure takes approximately one hour. There are no restrictions for this procedure. Please do NOT wear cologne, perfume, aftershave, or lotions (deodorant is allowed). Please arrive 15 minutes prior to your appointment time.   Follow-Up: At [facility], you and your health needs are our priority.  As part of our continuing mission to provide you with exceptional heart care, we have created designated Provider Care Teams.  These Care Teams include your primary Cardiologist (physician) and Advanced Practice Providers (APPs -  Physician Assistants and Nurse Practitioners) who all work together to provide you with the care you need, when you need it.  We recommend signing up for the patient portal called "MyChart".  Sign up information is provided on this After Visit  Summary.  MyChart is used to connect with patients for Virtual Visits (Telemedicine).  Patients are able to view lab/test results, encounter notes, upcoming appointments, etc.  Non-urgent messages can be sent to your provider as well.   To learn more about what you can do with MyChart, go to [email or web address].    Your next appointment:   3 - 4 month(s)  Provider:   You may see Bryan Lemma, MD or one of the following Advanced Practice Providers on your designated Care Team:   Nicolasa Ducking, NP Eula Listen, PA-C Cadence Fransico Michael, PA-C Charlsie Quest, NP    Other Instructions -None      Marykay Lex, MD, MS Bryan Lemma, M.D., M.S. Interventional Cardiologist  [facility]   [address]; [address] [place], [place]  [address] [phone]           Fax [phone]    Thank you for choosing [facility] in [place]!!

## 2023-02-04 ENCOUNTER — Encounter: Payer: Self-pay | Admitting: Cardiology

## 2023-02-04 DIAGNOSIS — R0609 Other forms of dyspnea: Secondary | ICD-10-CM | POA: Insufficient documentation

## 2023-02-04 DIAGNOSIS — G4733 Obstructive sleep apnea (adult) (pediatric): Secondary | ICD-10-CM | POA: Insufficient documentation

## 2023-02-04 DIAGNOSIS — R000 Tachycardia, unspecified: Secondary | ICD-10-CM

## 2023-02-04 HISTORY — DX: Other forms of dyspnea: R06.09

## 2023-02-04 HISTORY — DX: Tachycardia, unspecified: R00.0

## 2023-02-04 NOTE — Assessment & Plan Note (Signed)
Probably mostly related to deconditioning and obesity, however with longstanding hypertension and tachycardia as well as OSA would need to exclude cardiac or pulmonary etiology.  For now we will evaluate with 2D echo and pending results consider the possibility of stress test evaluation versus coronary calcium score for risk stratification.  Plan: Check 2D echo

## 2023-02-04 NOTE — Assessment & Plan Note (Signed)
Blood pressure is high here today, but with him telling me that he is not taking his losartan and HCTZ every day, I suspect that he is having some ups and downs.  With him having resting heart rate of 107 bpm and there being concern for potential pulm hypertension from OSA and can actually start calcium channel blocker for rate control.  As such we will reduce his losartan and HCTZ dose back to 50-12.5 mg.  Plan: STOP- losartan-hydrochlorothiazide (HYZAAR) 100-25 MG tablet -START taking 0.5 tablet by mouth daily for blood pressure in the morning START - diltiazem (CARDIZEM CD) 120 MG 24 hr capsule  - Take 1 capsule (120 mg total) by mouth daily at night.

## 2023-02-04 NOTE — Assessment & Plan Note (Signed)
Seems a chronic issue for him.  Likely related to obesity. With OSA there is concern for obesity hypoventilation syndrome and pulm hypertension.  Will add nondihydropyridine calcium channel blocker for rate control.  Plan: Start diltiazem CD1 20 mg daily with plans to titrate up further.  In doing so we will then decrease his losartan and HCTZ to 1/2 tablet daily.

## 2023-02-04 NOTE — Assessment & Plan Note (Signed)
Most recent lipids show improvement overall in total cholesterol and triglycerides but LDL still remains 133 and triglycerides still elevated.  With obesity and hypertension this meets criteria for metabolic syndrome.  Strongly recommend considering GLP-1 agonist such as Wegovy or Ozempic.   Will also discuss possibly restarting statin.

## 2023-02-04 NOTE — Assessment & Plan Note (Signed)
Certainly he is obese and has exertional dyspnea.  But overall since starting CPAP he is feels a whole lot better with better control blood pressures and improved energy.    With him having exertional dyspnea I suspect this related to obesity, but we can assess for pulmonary hypertension and cardiac function with 2D echo.  Plan: Check 2D echo

## 2023-02-04 NOTE — Assessment & Plan Note (Signed)
The patient understands the need to lose weight with diet and exercise. We have discussed specific strategies for this.  I think that he may very well benefit from [facility] or Zepbound

## 2023-02-04 NOTE — Assessment & Plan Note (Signed)
Not really having chest pain now.  We talked about whether we should do any type of ischemic evaluation on him and at this point he is not ready symptomatic.  He has more of a dyspnea concern than chest pain.  Plan is to check a 2D echocardiogram to assess EF and wall motion.  If that is abnormal then I think an ischemic evaluation is definitely warranted otherwise I would hold off.  We can consider coronary calcium scoring for risk stratification.

## 2023-02-17 DIAGNOSIS — J019 Acute sinusitis, unspecified: Secondary | ICD-10-CM | POA: Diagnosis not present

## 2023-02-17 DIAGNOSIS — J029 Acute pharyngitis, unspecified: Secondary | ICD-10-CM | POA: Diagnosis not present

## 2023-02-22 ENCOUNTER — Ambulatory Visit: Payer: BC Managed Care – PPO | Admitting: Internal Medicine

## 2023-02-22 ENCOUNTER — Encounter: Payer: Self-pay | Admitting: Internal Medicine

## 2023-02-22 VITALS — BP 138/84 | HR 97 | Temp 97.9°F | Ht 71.0 in | Wt 326.2 lb

## 2023-02-22 DIAGNOSIS — G4733 Obstructive sleep apnea (adult) (pediatric): Secondary | ICD-10-CM

## 2023-02-22 NOTE — Patient Instructions (Signed)
Continue CPAP as prescribed Recommend weight loss Recommend mask desensitization referral

## 2023-02-22 NOTE — Progress Notes (Signed)
Name: Gerald [name] MRN: [id number] DOB: Feb 23, 1986    CHIEF COMPLAINT:  Follow-up OSA Home sleep test February 2024 AHI of 28   4/202 DL Excellent compliance report 97% for days 80% for greater than 4 hours AHI reduced to 0.5   HISTORY OF PRESENT ILLNESS: Compliance report reviewed in detail with patient Excellent compliance report 97% for days 80% for greater than 4 hours AHI reduced to 0.5  Use and benefits from CPAP therapy Auto CPAP 5-12  Patient needs to be referred to [facility] for mask fitting assessment  Discussed sleep data and reviewed with patient.  Encouraged proper weight management.  Discussed driving precautions and its relationship with hypersomnolence.  Discussed operating dangerous equipment and its relationship with hypersomnolence.  Discussed sleep hygiene, and benefits of a fixed sleep waked time.  The importance of getting eight or more hours of sleep discussed with patient.  Discussed limiting the use of the computer and television before bedtime.  Decrease naps during the day, so night time sleep will become enhanced.  Limit caffeine, and sleep deprivation.  HTN, stroke, and heart failure are potential risk factors.     Weighs 313 pounds Gained 30 pounds last 3 years  Non smoker Non ETOH abuse Works as Tour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 [employer] Lots of traveling     PAST MEDICAL HISTORY :   has a past medical history of ADHD (attention deficit hyperactivity disorder), Anxiety, GERD (gastroesophageal reflux disease), Headache, Hypertension, Lyme disease, Mixed hyperlipidemia, Obstructive sleep apnea on CPAP (11/2022), Sinus tachycardia (02/04/2023), Ventral hernia, and Viral URI with cough (06/07/2013).  has a past surgical history that includes Tonsillectomy and adenoidectomy (~1991); Cholecystectomy (N/A, 04/08/2016); and XI robotic assisted ventral hernia (N/A, 12/02/2022). Prior to Admission medications   Medication Sig Start Date End Date Taking?  Authorizing Provider  losartan-hydrochlorothiazide (HYZAAR) 50-12.5 MG tablet Take 2 tablets by mouth daily. for blood pressure. 09/20/22   Doreene Nest, NP  Melatonin 10 MG TABS Take 10 mg by mouth.    [provider]  naproxen sodium (ALEVE) 220 MG tablet Take 220 mg by mouth daily as needed.    [provider]  omeprazole (PRILOSEC) 20 MG capsule TAKE 1 CAPSULE BY MOUTH EVERY DAY for heartburn. 04/22/21   Doreene Nest, NP  venlafaxine XR (EFFEXOR-XR) 75 MG 24 hr capsule TAKE 1 CAPSULE BY MOUTH DAILY WITH BREAKFAST FOR ANXIETY OR DEPRESSION 07/25/22   Doreene Nest, NP   Allergies  Allergen Reactions   Amoxicillin Hives    Has patient had a PCN reaction causing immediate rash, facial/tongue/throat swelling, SOB or lightheadedness with hypotension: Yes Has patient had a PCN reaction causing severe rash involving mucus membranes or skin necrosis: Yes Has patient had a PCN reaction that required hospitalization No Has patient had a PCN reaction occurring within the last 10 years: No If all of the above answers are "NO", then may proceed with Cephalosporin use.     FAMILY HISTORY:  family history includes Bipolar disorder in his sister; Depression in his brother; Diabetes in his father; Drug abuse in his mother; Hypertension in his mother; Mental illness in his mother. SOCIAL HISTORY:  reports that he has never smoked. He has never been exposed to tobacco smoke. He has never used smokeless tobacco. He reports that he does not currently use alcohol. He reports that he does not use drugs.   BP 138/84 (BP Location: Left Arm, Cuff Size: Large)   Pulse 97   Temp 97.9 F (  36.6 C) (Temporal)   Ht 5\' 11"  (1.803 m)   Wt (!) 326 lb 3.2 oz (148 kg)   SpO2 98%   BMI 45.50 kg/m      Review of Systems: Gen:  Denies  fever, sweats, chills weight loss  HEENT: Denies blurred vision, double vision, ear pain, eye pain, hearing loss, nose bleeds, sore  throat Cardiac:  No dizziness, chest pain or heaviness, chest tightness,edema, No JVD Resp:   No cough, -sputum production, -shortness of breath,-wheezing, -hemoptysis,  Other:  All other systems negative   Physical Examination:   General Appearance: No distress  EYES PERRLA, EOM intact.   NECK Supple, No JVD Pulmonary: normal breath sounds, No wheezing.  CardiovascularNormal S1,S2.  No m/r/g.   Abdomen: Benign, Soft, non-tender. Neurology UE/LE 5/5 strength, no focal deficits Ext pulses intact, cap refill intact ALL OTHER ROS ARE NEGATIVE   ASSESSMENT AND PLAN SYNOPSIS 37 year old white male morbid obesity with severe sleep apnea in the setting of deconditioned state  Severe sleep apnea Well-controlled with auto CPAP Excellent compliance report reviewed in detail AHI reduced to 0.5 Continue therapy as prescribed Recommend mask desensitization protocol program in [place]  Obesity -recommend significant weight loss -recommend changing diet  Deconditioned state -Recommend increased daily activity and exercise   MEDICATION ADJUSTMENTS/LABS AND TESTS ORDERED: Continue CPAP as prescribed Recommend weight loss Recommend mask desensitization referral  CURRENT MEDICATIONS REVIEWED AT LENGTH WITH PATIENT TODAY   Patient  satisfied with Plan of action and management. All questions answered  Follow up  3 months  Total Time Spent  22 mins    Wallis Bamberg Santiago Glad, M.D.  [facility]  Medical Director [facility] Medical Director [facility] Cardio-Pulmonary Department

## 2023-03-02 ENCOUNTER — Encounter: Payer: Self-pay | Admitting: Cardiology

## 2023-03-02 ENCOUNTER — Ambulatory Visit: Payer: BC Managed Care – PPO | Attending: Cardiology

## 2023-03-02 DIAGNOSIS — G4733 Obstructive sleep apnea (adult) (pediatric): Secondary | ICD-10-CM | POA: Diagnosis not present

## 2023-03-02 DIAGNOSIS — R079 Chest pain, unspecified: Secondary | ICD-10-CM

## 2023-03-02 LAB — ECHOCARDIOGRAM COMPLETE
AR max vel: 3.61 cm2
AV Area VTI: 3.37 cm2
AV Area mean vel: 3.51 cm2
AV Mean grad: 5 mmHg
AV Peak grad: 9.3 mmHg
Ao pk vel: 1.53 m/s
Area-P 1/2: 3.99 cm2

## 2023-03-02 MED ORDER — PERFLUTREN LIPID MICROSPHERE
1.0000 mL | INTRAVENOUS | Status: AC | PRN
  Administered 2023-03-02: 2 mL via INTRAVENOUS

## 2023-03-06 NOTE — Telephone Encounter (Signed)
Results noted.  Normal echo.  Bryan Lemma, MD

## 2023-03-16 ENCOUNTER — Ambulatory Visit (HOSPITAL_BASED_OUTPATIENT_CLINIC_OR_DEPARTMENT_OTHER): Payer: BC Managed Care – PPO | Attending: Internal Medicine | Admitting: Internal Medicine

## 2023-03-16 DIAGNOSIS — G4733 Obstructive sleep apnea (adult) (pediatric): Secondary | ICD-10-CM

## 2023-03-27 IMAGING — MR MR HEAD W/O CM
13 series · 47 of 48 positions shown · non-contrast
Comparison: None

CLINICAL DATA: Chronic migraine without Aya without status
migrainosus, not intractable. Headache, chronic, new features or
increased frequency. Additional history provided by scanning
technologist: Patient reports headaches for years, pain worse on the
right side, mostly above eyes, associated nausea/vomiting at times.

EXAM:
MRI HEAD WITHOUT CONTRAST
TECHNIQUE: Multiplanar, multiecho pulse sequences of the brain and surrounding
structures were obtained without intravenous contrast.

[Series 5: ax dwi_tracew · axial · 3.0mm · 0.65mm/px · z∈[-77,+77]mm · 2 of 48 slices shown]
[im 1/48]
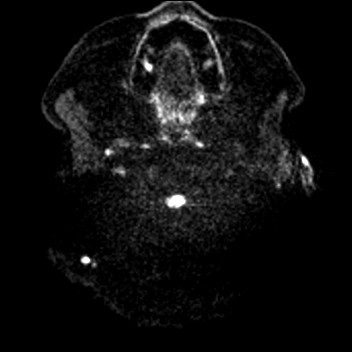
[im 48/48]
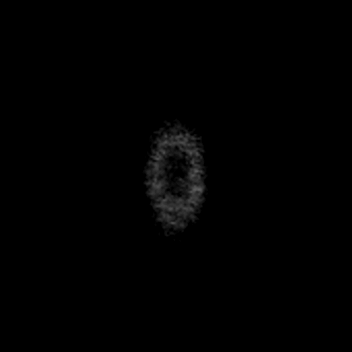

[Series 6: ax dwi_adc · axial · 3.0mm · 0.65mm/px · z∈[-77,+77]mm · 3 of 47 slices shown]
[im 1/47]
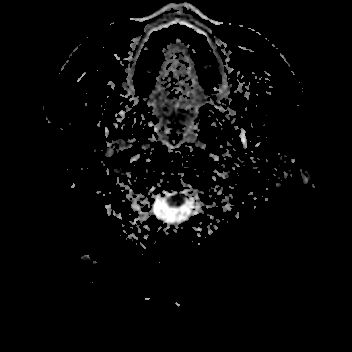
[im 24/47]
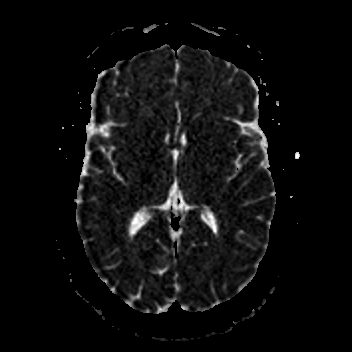
[im 47/47]
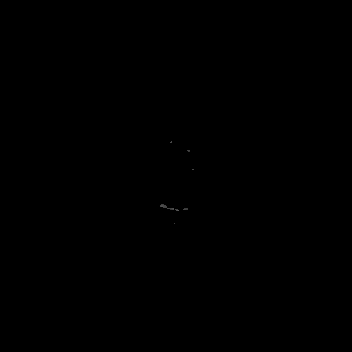

[Series 7: cor dwi_tracew · coronal · 5.0mm · 0.60mm/px · 3 of 38 slices shown]
[im 1/38]
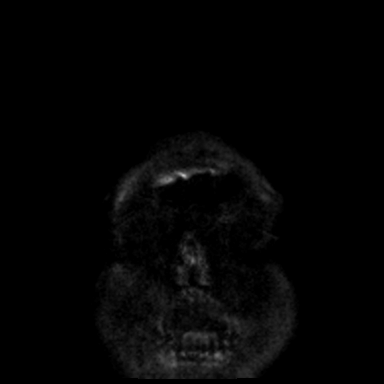
[im 19/38]
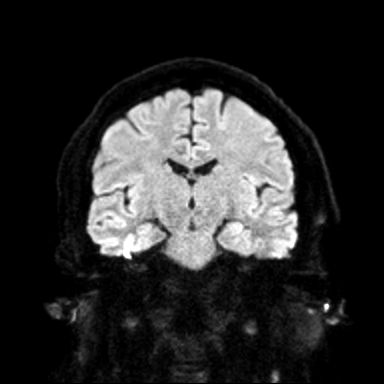
[im 38/38]
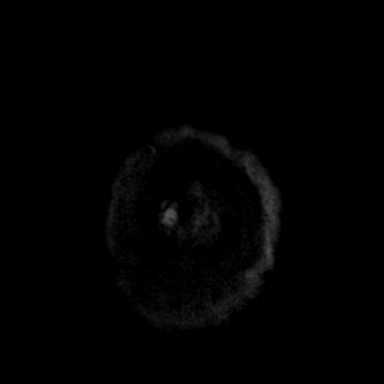

[Series 8: cor dwi_adc · coronal · 5.0mm · 0.60mm/px · 3 of 38 slices shown]
[im 1/38]
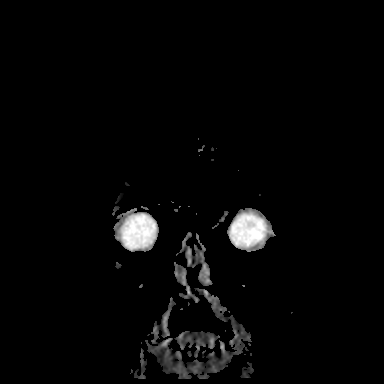
[im 19/38]
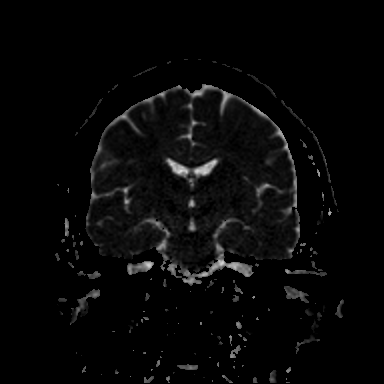
[im 38/38]
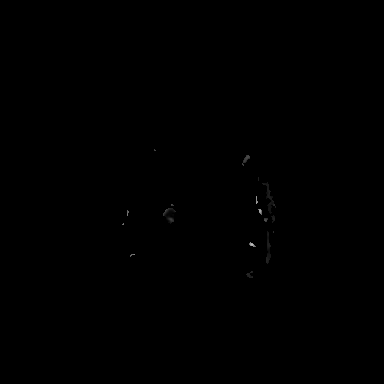

[Series 9: T1 · sagittal · 5.0mm · 0.62mm/px · 2 of 25 slices shown (1 of 2)]
[im 1/25]
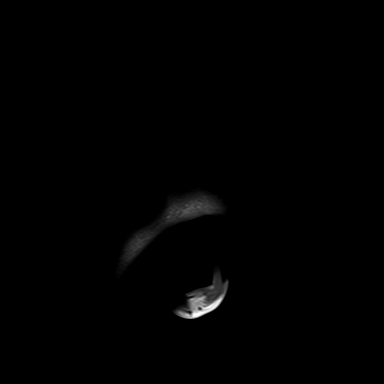
[im 25/25]
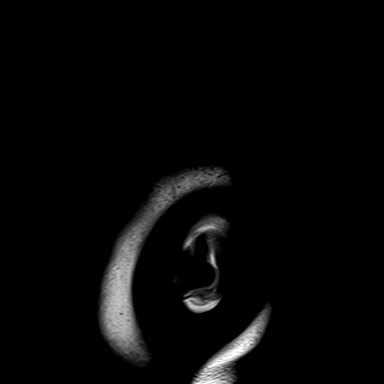

[Series 10: T2 · axial · 5.0mm · 0.53mm/px · z∈[-72,+72]mm · 2 of 25 slices shown (1 of 2)]
[im 1/25]
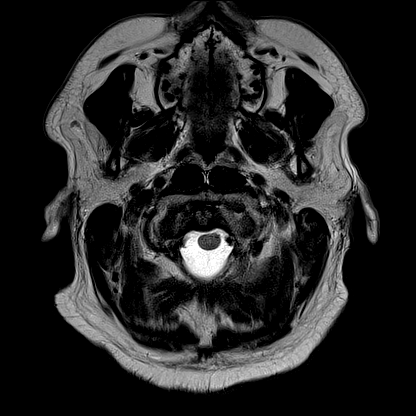
[im 25/25]
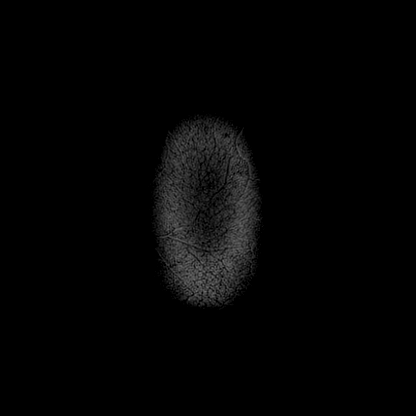

[Series 11: mag_images · axial · 3.0mm · 0.90mm/px · z∈[-89,+87]mm · 4 of 60 slices shown]
[im 1/60]
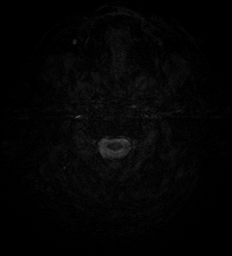
[im 20/60]
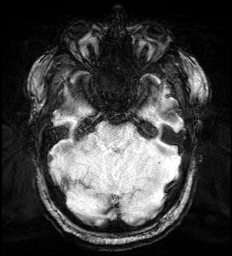
[im 40/60]
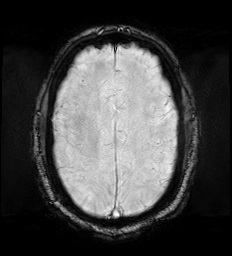
[im 60/60]
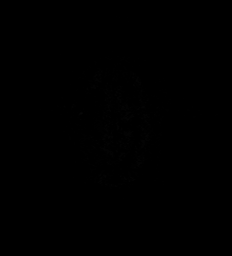

[Series 12: pha_images · axial · 3.0mm · 0.90mm/px · z∈[-89,+78]mm · 4 of 57 slices shown]
[im 1/57]
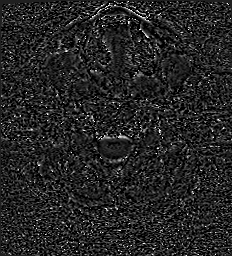
[im 19/57]
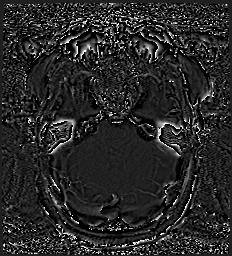
[im 38/57]
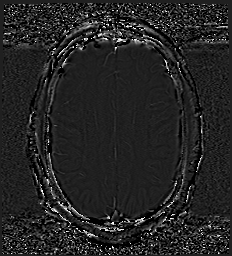
[im 57/57]
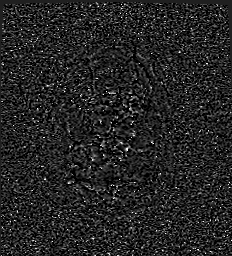

[Series 13: swi_images · axial · 3.0mm · 0.90mm/px · z∈[-89,+87]mm · 4 of 60 slices shown]
[im 1/60]
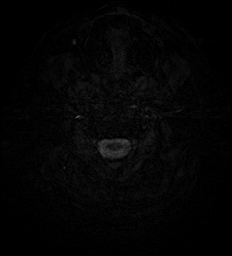
[im 20/60]
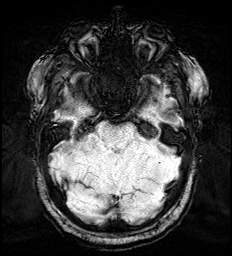
[im 40/60]
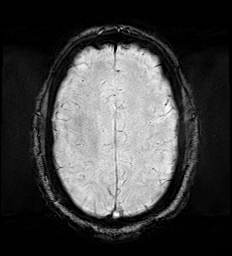
[im 60/60]
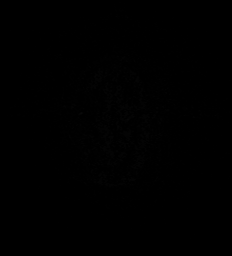

[Series 14: mip_images(sw) · axial · 24.0mm · 0.90mm/px · z∈[-78,+77]mm · 4 of 53 slices shown]
[im 1/53]
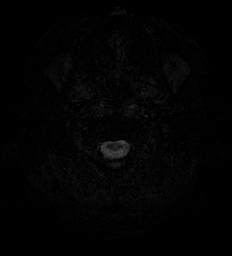
[im 18/53]
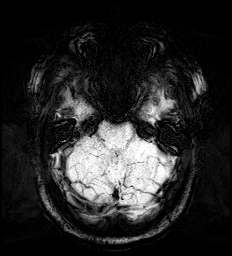
[im 35/53]
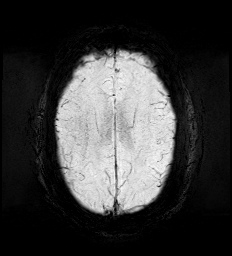
[im 53/53]
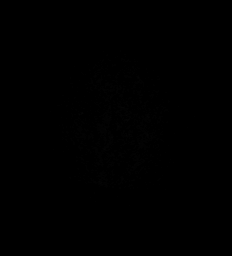

[Series 15: FLAIR · axial · 3.0mm · 0.53mm/px · z∈[-81,+80]mm · 4 of 55 slices shown]
[im 1/55]
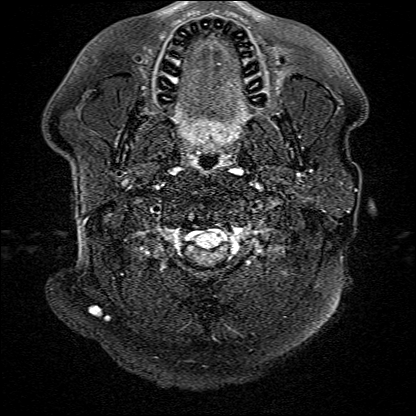
[im 19/55]
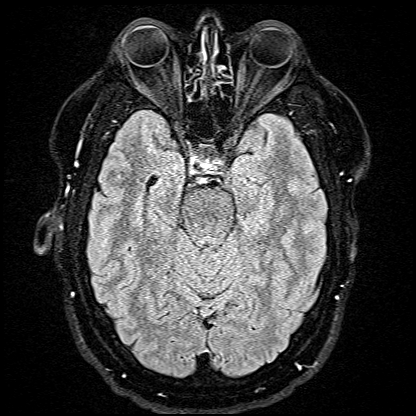
[im 37/55]
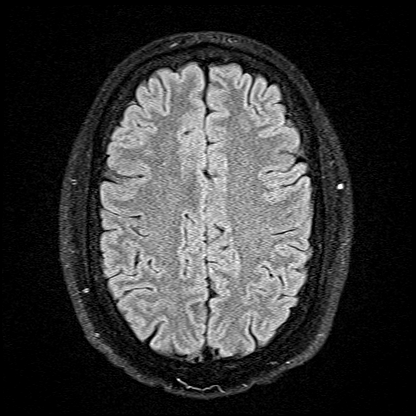
[im 55/55]
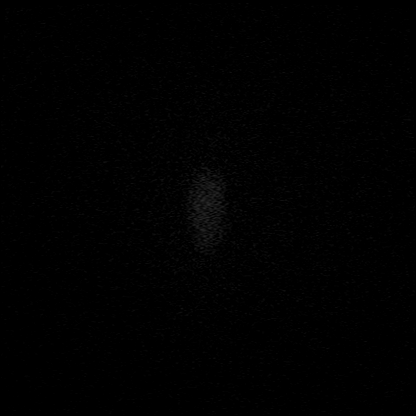

[Series 16: T1 · axial · 1.0mm · 0.98mm/px · z∈[-72,+87]mm · 10 of 160 slices shown (2 of 2)]
[im 1/160]
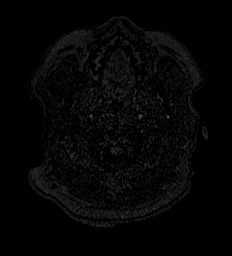
[im 16/160]
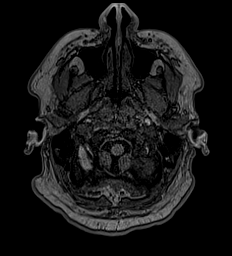
[im 32/160]
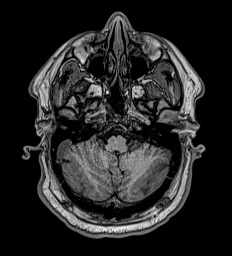
[im 48/160]
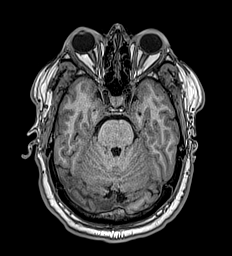
[im 64/160]
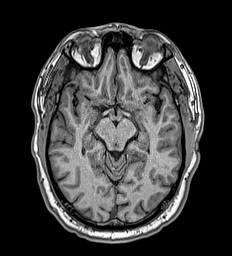
[im 80/160]
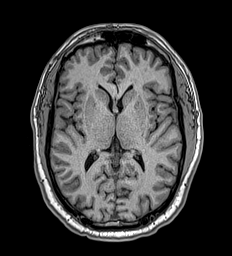
[im 96/160]
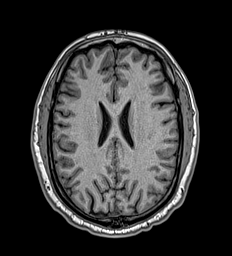
[im 112/160]
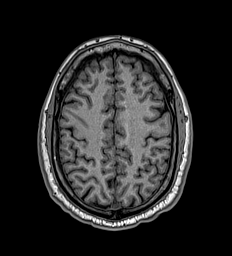
[im 128/160]
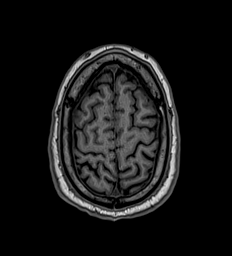
[im 160/160]
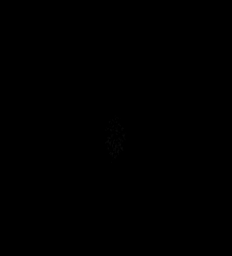

[Series 17: T2 · coronal · 5.0mm · 0.57mm/px · 2 of 29 slices shown (2 of 2)]
[im 1/29]
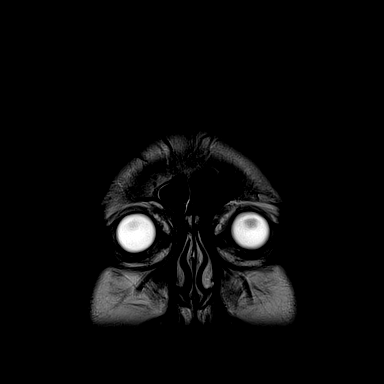
[im 29/29]
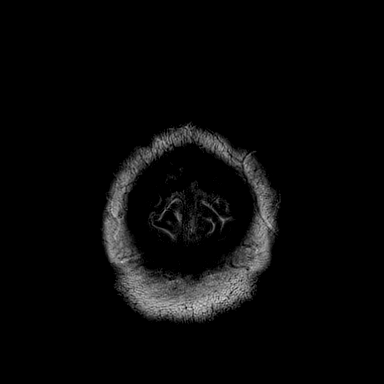

[47 of 48 positions shown; findings below may reference images not displayed]

FINDINGS: Brain:

Cerebral volume is normal.

There are a few small (measuring up to 3 mm) foci of T2 FLAIR
hyperintense signal abnormality within the bilateral frontal lobe
white matter, nonspecific.

No cortical encephalomalacia is identified.

There is no acute infarct.

No evidence of an intracranial mass.

No chronic intracranial blood products.

No extra-axial fluid collection.

No midline shift.

Vascular: Maintained flow voids within the proximal large arterial
vessels.

Skull and upper cervical spine: No focal suspicious marrow lesion.

Sinuses/Orbits: Visualized orbits show no acute finding. Trace
mucosal thickening within the bilateral ethmoid sinuses.
IMPRESSION: No evidence of acute intracranial abnormality.

There are a few small (measuring up to 3 mm) nonspecific T2 FLAIR
hyperintense remote insults scattered within the bilateral frontal
lobe white matter.

Otherwise unremarkable non-contrast MRI appearance of the brain.

## 2023-04-02 DIAGNOSIS — G4733 Obstructive sleep apnea (adult) (pediatric): Secondary | ICD-10-CM | POA: Diagnosis not present

## 2023-04-06 DIAGNOSIS — I10 Essential (primary) hypertension: Secondary | ICD-10-CM | POA: Diagnosis not present

## 2023-04-06 DIAGNOSIS — G4733 Obstructive sleep apnea (adult) (pediatric): Secondary | ICD-10-CM | POA: Diagnosis not present

## 2023-04-08 DIAGNOSIS — Z01810 Encounter for preprocedural cardiovascular examination: Secondary | ICD-10-CM | POA: Diagnosis not present

## 2023-04-08 DIAGNOSIS — Z6841 Body Mass Index (BMI) 40.0 and over, adult: Secondary | ICD-10-CM | POA: Diagnosis not present

## 2023-04-08 DIAGNOSIS — G4733 Obstructive sleep apnea (adult) (pediatric): Secondary | ICD-10-CM | POA: Diagnosis not present

## 2023-04-15 DIAGNOSIS — Z01811 Encounter for preprocedural respiratory examination: Secondary | ICD-10-CM | POA: Diagnosis not present

## 2023-05-02 DIAGNOSIS — G4733 Obstructive sleep apnea (adult) (pediatric): Secondary | ICD-10-CM | POA: Diagnosis not present

## 2023-05-05 ENCOUNTER — Ambulatory Visit: Payer: BC Managed Care – PPO | Admitting: Primary Care

## 2023-05-05 ENCOUNTER — Encounter: Payer: Self-pay | Admitting: Primary Care

## 2023-05-05 VITALS — BP 138/92 | HR 103 | Temp 97.8°F | Ht 71.0 in | Wt 318.0 lb

## 2023-05-05 DIAGNOSIS — F900 Attention-deficit hyperactivity disorder, predominantly inattentive type: Secondary | ICD-10-CM

## 2023-05-05 DIAGNOSIS — E559 Vitamin D deficiency, unspecified: Secondary | ICD-10-CM

## 2023-05-05 DIAGNOSIS — F411 Generalized anxiety disorder: Secondary | ICD-10-CM

## 2023-05-05 MED ORDER — VITAMIN D (ERGOCALCIFEROL) 1.25 MG (50000 UNIT) PO CAPS
ORAL_CAPSULE | ORAL | 0 refills | Status: DC
Start: 2023-05-05 — End: 2023-09-15

## 2023-05-05 MED ORDER — AMPHETAMINE-DEXTROAMPHET ER 20 MG PO CP24: 20.0000 mg | ORAL_CAPSULE | ORAL | 0 refills | Status: DC

## 2023-05-05 MED ORDER — HYDROXYZINE HCL 10 MG PO TABS
10.0000 mg | ORAL_TABLET | Freq: Two times a day (BID) | ORAL | 0 refills | Status: DC | PRN
Start: 2023-05-05 — End: 2023-11-23

## 2023-05-05 NOTE — Assessment & Plan Note (Signed)
Reviewed vitamin D lab from bariatric surgery through [facility] which was 13.  Treat with vitamin D 50,000 international units once weekly x 12 weeks, recheck levels in 12 weeks.

## 2023-05-05 NOTE — Assessment & Plan Note (Signed)
Deteriorated.  Agree to provide prescription for hydroxyzine. We discussed that he would need to re-consider daily treatment if he finds himself using the hydroxyzine frequently.   Start hydroxyzine 10 mg BID PRN.  Consider Zoloft or Cymbalta if no improvement.

## 2023-05-05 NOTE — Patient Instructions (Addendum)
We increased the dose of your Adderall to 20 mg daily as needed.  You can take hydroxyzine 10 mg twice daily as needed for anxiety.   Start vitamin D capsules. Take 1 capsule by mouth once weekly for 12 weeks.  Set up a lab appointment for 3 months to recheck vitamin D.  It was a pleasure to see you today!

## 2023-05-05 NOTE — Assessment & Plan Note (Signed)
Improving, not at goal. Increase Adderall XR to 20 mg daily.  He will update.

## 2023-05-05 NOTE — Progress Notes (Signed)
Subjective:    Patient ID: Gerald [name], male    DOB: [birth year], 37 y.o.   MRN: [id number]  HPI  Gerald [name] is a very pleasant 37 y.o. male with a history of hypertension, migraines, OSA, GERD, ADHD, hyperlipidemia, GAD who presents today to discuss anxiety.  Chronic symptoms since 05-26-16 which stemmed from stress at work. Previously managed on citalopram 10 mg and 20 mg at different times; bupropion SR 75 mg BID, Lexapro 10 mg and 20 mg at different times. He did not like the way he felt on Lexapro.  Most recently on venlafaxine ER 75 mg which helped with anxiety but not for depression.   In April 2024 he discontinued his venlafaxine XL 75 mg as he "felt like a zombie". Since returning from vacation two weeks ago he's noticed symptoms of irritability, feeling overwhelmed, anxious about work. He's also noticed a return of his depression with symptoms of little motivation, loss of control at work.   He is managed on Adderall XR 15 mg for which he takes 2-3 days weekly on days that he needs to focus in the office. Overall he has done well on Adderall but he finds himself less focused around 2 pm in the afternoon. He questions if he has low testosterone.   He took some of his daughter's hydroxyzine recently which helped a lot with anxiety. He is requesting to resume something for anxiety that he takes as needed.    BP Readings from Last 3 Encounters:  05/05/23 (!) 138/92  02/22/23 138/84  02/03/23 (!) 140/88      Review of Systems  Constitutional:  Positive for fatigue.  Respiratory:  Negative for shortness of breath.   Cardiovascular:  Negative for chest pain.  Psychiatric/Behavioral:  The patient is nervous/anxious.        See HPI         Past Medical History:  Diagnosis Date   ADHD (attention deficit hyperactivity disorder)    Anxiety    GERD (gastroesophageal reflux disease)    Headache    migraiones   Hypertension    Lyme disease    Mixed hyperlipidemia     Obstructive sleep apnea on CPAP 11/2022   Home Sleep Test 11/22/2022: Moderate Obstructive Sleep Apnea with AHI of 28.1 and SpO2 low of 64%. => Recommended weight loss, CPAP, oral appliance or surgical assessment. (Epworth Score - 12)   Sinus tachycardia 02/04/2023   Ventral hernia    Viral URI with cough 06/07/2013    Social History   Socioeconomic History   Marital status: Married    Spouse name: Not on file   Number of children: Not on file   Years of education: Not on file   Highest education level: Not on file  Occupational History   Occupation: Event organiser: Production manager  Tobacco Use   Smoking status: Never    Passive exposure: Never   Smokeless tobacco: Never  Vaping Use   Vaping status: Never Used  Substance and Sexual Activity   Alcohol use: Not Currently   Drug use: No   Sexual activity: Not on file  Other Topics Concern   Not on file  Social History Narrative   Agricultural consultant for [employer]   Married 2 kids, 3rd child died of hypoplastic L heart 05/26/12   Social Determinants of Health   Financial Resource Strain: Not on file  Food Insecurity: Not on file  Transportation Needs: Not on file  Physical Activity: Not  on file  Stress: Not on file  Social Connections: Not on file  Intimate Partner Violence: Not on file    Past Surgical History:  Procedure Laterality Date   CHOLECYSTECTOMY N/A 04/08/2016   Procedure: LAPAROSCOPIC CHOLECYSTECTOMY WITH INTRAOPERATIVE CHOLANGIOGRAM;  Surgeon: Ricarda Frame, MD;  Location: [facility] ORS;  Service: General;  Laterality: N/A;   TONSILLECTOMY AND ADENOIDECTOMY  ~1991   XI ROBOTIC ASSISTED VENTRAL HERNIA N/A 12/02/2022   Procedure: XI ROBOTIC ASSISTED VENTRAL HERNIA, incisional hernia;  Surgeon: Henrene Dodge, MD;  Location: [facility] ORS;  Service: General;  Laterality: N/A;    Family History  Problem Relation Age of Onset   Drug abuse Mother    Hypertension Mother    Mental illness Mother    Diabetes Father     Bipolar disorder Sister    Depression Brother     Allergies  Allergen Reactions   Amoxicillin Hives    Has patient had a PCN reaction causing immediate rash, facial/tongue/throat swelling, SOB or lightheadedness with hypotension: Yes Has patient had a PCN reaction causing severe rash involving mucus membranes or skin necrosis: Yes Has patient had a PCN reaction that required hospitalization No Has patient had a PCN reaction occurring within the last 10 years: No If all of the above answers are "NO", then may proceed with Cephalosporin use.     Current Outpatient Medications on File Prior to Visit  Medication Sig Dispense Refill   acetaminophen (TYLENOL) 500 MG tablet Take 2 tablets (1,000 mg total) by mouth every 6 (six) hours as needed for mild pain.     diltiazem (CARDIZEM CD) 120 MG 24 hr capsule Take 1 capsule (120 mg total) by mouth daily at night. 90 capsule 3   ibuprofen (ADVIL) 800 MG tablet Take 1 tablet (800 mg total) by mouth every 8 (eight) hours as needed for moderate pain. 60 tablet 1   losartan-hydrochlorothiazide (HYZAAR) 100-25 MG tablet for blood pressure in the morning 90 tablet 1   Melatonin 10 MG TABS Take 10 mg by mouth at bedtime.     naproxen sodium (ALEVE) 220 MG tablet Take 220 mg by mouth daily as needed.     omeprazole (PRILOSEC) 20 MG capsule TAKE 1 CAPSULE BY MOUTH EVERY DAY for heartburn. 90 capsule 3   No current facility-administered medications on file prior to visit.    BP (!) 138/92   Pulse (!) 103   Temp 97.8 F (36.6 C) (Temporal)   Ht 5\' 11"  (1.803 m)   Wt (!) 318 lb (144.2 kg)   SpO2 96%   BMI 44.35 kg/m  Objective:   Physical Exam Cardiovascular:     Rate and Rhythm: Normal rate and regular rhythm.  Pulmonary:     Effort: Pulmonary effort is normal.     Breath sounds: Normal breath sounds. No wheezing or rales.  Musculoskeletal:     Cervical back: Neck supple.  Skin:    General: Skin is warm and dry.  Neurological:     Mental  Status: He is alert and oriented to person, place, and time.           Assessment & Plan:  GAD (generalized anxiety disorder) Assessment & Plan: Deteriorated.  Agree to provide prescription for hydroxyzine. We discussed that he would need to re-consider daily treatment if he finds himself using the hydroxyzine frequently.   Start hydroxyzine 10 mg BID PRN.  Consider Zoloft or Cymbalta if no improvement.   Orders: -     hydrOXYzine HCl;  Take 1 tablet (10 mg total) by mouth 2 (two) times daily as needed for anxiety.  Dispense: 60 tablet; Refill: 0  ADHD, predominantly inattentive type Assessment & Plan: Improving, not at goal. Increase Adderall XR to 20 mg daily.  He will update.  Orders: -     Amphetamine-Dextroamphet ER; Take 1 capsule (20 mg total) by mouth every morning.  Dispense: 30 capsule; Refill: 0  Vitamin D deficiency Assessment & Plan: Reviewed vitamin D lab from bariatric surgery through [facility] which was 13.  Treat with vitamin D 50,000 international units once weekly x 12 weeks, recheck levels in 12 weeks.   Orders: -     Vitamin D (Ergocalciferol); Take 1 capsule by mouth once weekly for 12 weeks.  Dispense: 12 capsule; Refill: 0        Doreene Nest, NP

## 2023-05-15 ENCOUNTER — Telehealth: Payer: BC Managed Care – PPO | Admitting: Family Medicine

## 2023-05-15 DIAGNOSIS — U071 COVID-19: Secondary | ICD-10-CM | POA: Diagnosis not present

## 2023-05-15 MED ORDER — PROMETHAZINE-DM 6.25-15 MG/5ML PO SYRP: 5.0000 mL | ORAL_SOLUTION | Freq: Four times a day (QID) | ORAL | 0 refills | Status: AC | PRN

## 2023-05-15 MED ORDER — NIRMATRELVIR/RITONAVIR (PAXLOVID)TABLET: 3.0000 | ORAL_TABLET | Freq: Two times a day (BID) | ORAL | 0 refills | Status: AC

## 2023-05-15 MED ORDER — PREDNISONE 20 MG PO TABS: 20.0000 mg | ORAL_TABLET | Freq: Two times a day (BID) | ORAL | 0 refills | Status: AC

## 2023-05-15 NOTE — Progress Notes (Signed)
Virtual Visit Consent   Gerald [name], you are scheduled for a virtual visit with a [facility] provider today. Just as with appointments in the office, your consent must be obtained to participate. Your consent will be active for this visit and any virtual visit you may have with one of our providers in the next 365 days. If you have a MyChart account, a copy of this consent can be sent to you electronically.  As this is a virtual visit, video technology does not allow for your provider to perform a traditional examination. This may limit your provider's ability to fully assess your condition. If your provider identifies any concerns that need to be evaluated in person or the need to arrange testing (such as labs, EKG, etc.), we will make arrangements to do so. Although advances in technology are sophisticated, we cannot ensure that it will always work on either your end or our end. If the connection with a video visit is poor, the visit may have to be switched to a telephone visit. With either a video or telephone visit, we are not always able to ensure that we have a secure connection.  By engaging in this virtual visit, you consent to the provision of healthcare and authorize for your insurance to be billed (if applicable) for the services provided during this visit. Depending on your insurance coverage, you may receive a charge related to this service.  I need to obtain your verbal consent now. Are you willing to proceed with your visit today? Gerald [name] has provided verbal consent on 05/15/2023 for a virtual visit (video or telephone). Georgana Curio, FNP  Date: 05/15/2023 12:36 PM  Virtual Visit via Video Note   I, Georgana Curio, connected with  Gerald [name]  ([id number], [birth year]) on 05/15/23 at 12:30 PM EDT by a video-enabled telemedicine application and verified that I am speaking with the correct person using two identifiers.  Location: Patient: Virtual Visit Location Patient: Home Provider:  Virtual Visit Location Provider: Home Office   I discussed the limitations of evaluation and management by telemedicine and the availability of in person appointments. The patient expressed understanding and agreed to proceed.    History of Present Illness: Gerald [name] is a 37 y.o. who identifies as a male who was assigned male at birth, and is being seen today for covid positive test last night. Sx include, cough, head congestion, sob, fever, body aches and headaches.No wheezing. In no distress.   HPI: HPI  Problems:  Patient Active Problem List   Diagnosis Date Noted   Vitamin D deficiency 05/05/2023   Sinus tachycardia 02/04/2023   OSA on CPAP 02/04/2023   DOE (dyspnea on exertion) 02/04/2023   Concentration deficit 06/20/2022   Essential hypertension 06/20/2022   Abdominal hernia 06/20/2022   ADHD, predominantly inattentive type 09/23/2021   Right upper quadrant abdominal mass 09/23/2021   Insomnia 07/16/2020   Epigastric abdominal pain 07/24/2019   BRBPR (bright red blood per rectum) 07/24/2019   Migraine 02/23/2019   Frequent headaches 02/27/2018   Preventative health care 07/19/2017   Mixed hyperlipidemia 07/19/2017   GERD (gastroesophageal reflux disease) 07/15/2017   Scrotal mass 08/26/2016   Morbid obesity with BMI of 40.0-44.9, adult (HCC) 12/04/2015   GAD (generalized anxiety disorder) 04/09/2015   Chest pain of uncertain etiology 01/08/2014   Hypersomnolence 12/02/2011   Fatigue 11/09/2011    Allergies:  Allergies  Allergen Reactions   Amoxicillin Hives    Has patient had a PCN reaction causing immediate  rash, facial/tongue/throat swelling, SOB or lightheadedness with hypotension: Yes Has patient had a PCN reaction causing severe rash involving mucus membranes or skin necrosis: Yes Has patient had a PCN reaction that required hospitalization No Has patient had a PCN reaction occurring within the last 10 years: No If all of the above answers are "NO", then may  proceed with Cephalosporin use.    Medications:  Current Outpatient Medications:    nirmatrelvir/ritonavir (PAXLOVID) 20 x 150 MG & 10 x 100MG  TABS, Take 3 tablets by mouth 2 (two) times daily for 5 days. (Take nirmatrelvir 150 mg two tablets twice daily for 5 days and ritonavir 100 mg one tablet twice daily for 5 days) Patient GFR is >60, Disp: 30 tablet, Rfl: 0   predniSONE (DELTASONE) 20 MG tablet, Take 1 tablet (20 mg total) by mouth 2 (two) times daily with a meal for 5 days., Disp: 10 tablet, Rfl: 0   promethazine-dextromethorphan (PROMETHAZINE-DM) 6.25-15 MG/5ML syrup, Take 5 mLs by mouth 4 (four) times daily as needed for up to 10 days for cough., Disp: 118 mL, Rfl: 0   acetaminophen (TYLENOL) 500 MG tablet, Take 2 tablets (1,000 mg total) by mouth every 6 (six) hours as needed for mild pain., Disp: , Rfl:    amphetamine-dextroamphetamine (ADDERALL XR) 20 MG 24 hr capsule, Take 1 capsule (20 mg total) by mouth every morning., Disp: 30 capsule, Rfl: 0   diltiazem (CARDIZEM CD) 120 MG 24 hr capsule, Take 1 capsule (120 mg total) by mouth daily at night., Disp: 90 capsule, Rfl: 3   hydrOXYzine (ATARAX) 10 MG tablet, Take 1 tablet (10 mg total) by mouth 2 (two) times daily as needed for anxiety., Disp: 60 tablet, Rfl: 0   ibuprofen (ADVIL) 800 MG tablet, Take 1 tablet (800 mg total) by mouth every 8 (eight) hours as needed for moderate pain., Disp: 60 tablet, Rfl: 1   losartan-hydrochlorothiazide (HYZAAR) 100-25 MG tablet, for blood pressure in the morning, Disp: 90 tablet, Rfl: 1   Melatonin 10 MG TABS, Take 10 mg by mouth at bedtime., Disp: , Rfl:    naproxen sodium (ALEVE) 220 MG tablet, Take 220 mg by mouth daily as needed., Disp: , Rfl:    omeprazole (PRILOSEC) 20 MG capsule, TAKE 1 CAPSULE BY MOUTH EVERY DAY for heartburn., Disp: 90 capsule, Rfl: 3   Vitamin D, Ergocalciferol, (DRISDOL) 1.25 MG (50000 UNIT) CAPS capsule, Take 1 capsule by mouth once weekly for 12 weeks., Disp: 12 capsule,  Rfl: 0  Observations/Objective: Patient is well-developed, well-nourished in no acute distress.  Resting comfortably  at home.  Head is normocephalic, atraumatic.  No labored breathing.  Speech is clear and coherent with logical content.  Patient is alert and oriented at baseline.    Assessment and Plan: 1. COVID  Increase fluids, humidifier at night, tylenol or ibuprofen as directed, UC if sx worsen. Quarantine discussed. Add Vit d and zinc.   Follow Up Instructions: I discussed the assessment and treatment plan with the patient. The patient was provided an opportunity to ask questions and all were answered. The patient agreed with the plan and demonstrated an understanding of the instructions.  A copy of instructions were sent to the patient via MyChart unless otherwise noted below.     The patient was advised to call back or seek an in-person evaluation if the symptoms worsen or if the condition fails to improve as anticipated.  Time:  I spent 10 minutes with the patient via telehealth technology discussing  the above problems/concerns.    Georgana Curio, FNP

## 2023-05-15 NOTE — Patient Instructions (Signed)

## 2023-05-25 DIAGNOSIS — Z713 Dietary counseling and surveillance: Secondary | ICD-10-CM | POA: Diagnosis not present

## 2023-05-25 DIAGNOSIS — Z6841 Body Mass Index (BMI) 40.0 and over, adult: Secondary | ICD-10-CM | POA: Diagnosis not present

## 2023-05-26 ENCOUNTER — Ambulatory Visit: Payer: BC Managed Care – PPO | Attending: Cardiology | Admitting: Cardiology

## 2023-05-26 ENCOUNTER — Encounter: Payer: Self-pay | Admitting: Cardiology

## 2023-05-26 NOTE — Progress Notes (Deleted)
Cardiology Office Note:  .   Date:  05/26/2023  ID:  Gerald [name], DOB 07/04/1986, MRN [id number] PCP: Doreene Nest, NP  [facility] Providers Cardiologist:  None { Click to update primary MD,subspecialty MD or APP then REFRESH:1}    No chief complaint on file.   History of Present Illness: .     Gerald [name] is a morbidly obese 37 y.o. male with a PMH notable for HTN, anxiety/ADHD and OSA who presents here for *** at the request of Doreene Nest, NP.  Gerald [name] was last seen on February 03, 2023 for chest pain dyspnea at the request of Dr. Belia Heman Dr. Sammuel Cooper.  He did not recall any mention of chest pain or pressure.  Just some dyspnea.  Noted having much better control of his blood pressure and dyspnea as well as any palpitations while on CPAP.  BP was also better controlled being on losartan-HCTZ but noted some lightheadedness and dizziness.  He then stopped it and is feeling better.  I recommended starting diltiazem 120 mg daily and recommended reducing his losartan-HCTZ to 1/2 tablet of the 100-25 mg tabs daily.  Recommended GLP-1 agonist or even potentially considering bariatric surgery. => Check 2D echocardiogram because of exertional dyspnea and to assess for pulmonary hypertension.  He was actually then seen at [facility] for Preop CV evaluation for bariatric surgery.  => he noticed going to the gym for an hour or 2 at a time and denied any chest pain or dyspnea.  Some dyspnea with strenuous activity.  GERD related atypical discomfort.  Noted the relatively normal echocardiogram performed here. => Felt to have no indication for ischemic CV evaluation for upcoming bariatric surgery.  Sinus tachycardia felt to be related to obesity and deconditioning as well as potentially Adderall.  Recommended continuing diltiazem.    Subjective   INTERVAL HISTORY   ROS:  Cardiovascular ROS: {roscv:310661} Review of Systems - {ros  master:310782}     Objective   Studies Reviewed: Marland Kitchen       ECHO 03/02/2023: EF 60 to 65%.  Normal LV size and function.  Normal diastolic parameters.  Normal valves.  Normal RAP.  Risk Assessment/Calculations:   {Does this patient have ATRIAL FIBRILLATION?[phone]} No BP recorded.  {Refresh Note OR Click here to enter BP  :1}***         Physical Exam:   VS:  There were no vitals taken for this visit.   Wt Readings from Last 3 Encounters:  05/05/23 (!) 318 lb (144.2 kg)  02/22/23 (!) 326 lb 3.2 oz (148 kg)  02/03/23 (!) 329 lb (149.2 kg)    GEN: Well nourished, well developed in no acute distress; *** NECK: No JVD; No carotid bruits CARDIAC: Normal S1, S2; RRR, no murmurs, rubs, gallops RESPIRATORY:  Clear to auscultation without rales, wheezing or rhonchi ; nonlabored, good air movement. ABDOMEN: Soft, non-tender, non-distended EXTREMITIES:  No edema; No deformity      ASSESSMENT AND PLAN: .    Problem List Items Addressed This Visit   None       {Are you ordering a CV Procedure (e.g. stress test, cath, DCCV, TEE, etc)?   Press F2        :[id number]}   Dispo: No follow-ups on file.  Total time spent: *** min spent with patient + *** min spent charting = *** min  Signed, Marykay Lex, MD, MS Bryan Lemma, M.D., M.S. Interventional Cardiologist  [facility]  Pager # [phone] Phone # [phone] [address] [place], [place] [address]

## 2023-05-30 ENCOUNTER — Other Ambulatory Visit: Payer: Self-pay | Admitting: Primary Care

## 2023-05-30 DIAGNOSIS — F411 Generalized anxiety disorder: Secondary | ICD-10-CM

## 2023-06-02 DIAGNOSIS — G4733 Obstructive sleep apnea (adult) (pediatric): Secondary | ICD-10-CM | POA: Diagnosis not present

## 2023-06-23 DIAGNOSIS — Z6841 Body Mass Index (BMI) 40.0 and over, adult: Secondary | ICD-10-CM | POA: Diagnosis not present

## 2023-06-23 DIAGNOSIS — I10 Essential (primary) hypertension: Secondary | ICD-10-CM | POA: Diagnosis not present

## 2023-06-23 DIAGNOSIS — G4733 Obstructive sleep apnea (adult) (pediatric): Secondary | ICD-10-CM | POA: Diagnosis not present

## 2023-06-29 ENCOUNTER — Ambulatory Visit (INDEPENDENT_AMBULATORY_CARE_PROVIDER_SITE_OTHER): Payer: BC Managed Care – PPO | Admitting: Primary Care

## 2023-06-29 VITALS — BP 120/78 | HR 98 | Temp 97.3°F | Ht 71.0 in | Wt 315.0 lb

## 2023-06-29 DIAGNOSIS — I10 Essential (primary) hypertension: Secondary | ICD-10-CM

## 2023-06-29 DIAGNOSIS — R519 Headache, unspecified: Secondary | ICD-10-CM | POA: Diagnosis not present

## 2023-06-29 DIAGNOSIS — F900 Attention-deficit hyperactivity disorder, predominantly inattentive type: Secondary | ICD-10-CM | POA: Diagnosis not present

## 2023-06-29 DIAGNOSIS — G4733 Obstructive sleep apnea (adult) (pediatric): Secondary | ICD-10-CM

## 2023-06-29 DIAGNOSIS — G4700 Insomnia, unspecified: Secondary | ICD-10-CM

## 2023-06-29 NOTE — Assessment & Plan Note (Addendum)
Recently deteriorated. Resume trazodone 50 mg as needed.

## 2023-06-29 NOTE — Assessment & Plan Note (Signed)
Improving.  He takes Adderall XR 20 mg 1-2 times per week.   He will update.

## 2023-06-29 NOTE — Progress Notes (Signed)
Subjective:    Patient ID: Gerald [name], male    DOB: 13-Sep-1986, 37 y.o.   MRN: [id number]  HPI  Gerald [name] is a very pleasant 37 y.o. male with a history of hypertension, migraines, OSA, morbid obesity, hyperlipidemia, sinus tachycardia, ADHD, frequent headaches who presents today to discuss headaches and lightheadedness.  Over the last 10 days he's experienced headaches for 6 of those days. His headaches are located to the bilateral upper cervical spine pain with radiation to the bilateral occipital lobes. He's been taking Excedrin Migraine with improvement.   Previously managed on propranolol ER 80 mg and 120 mg at different times for recurrent headaches.  His current headaches feel similar to prior headaches.  He mainly presents today as he thought that his headaches were secondary to elevated blood pressure as this was the case previously.  He's also noticed a few episodes of lightheadedness when getting out of bed in the morning. This lasts for a few seconds. Two occurences within the last 1 week.   Currently prescribed diltiazem 120 mg daily, losartan-hydrochlorothiazide 100-25 mg daily. He does not check his BP at home as he does not have a working cuff.  Following with cardiology, last evaluation was in April 2024.  During this visit he was initiated on diltiazem CD1 20 mg for tachycardia.  His losartan-hydrochlorothiazide was decreased to 50-12.5 mg daily. He took the diltiazem for about 2 months as it did not help to reduce heart rate. His last dose was in early August.  He increased his losartan-hydrochlorothiazide back to 100-25 mg daily.  Following with pulmonology for sleep apnea.  Last office visit was in May 2024 for follow-up of sleep apnea after initiation of CPAP.  He had excellent compliance.  Weight loss was recommended.  He is pending bariatric surgery for October 2024. Evaluated by cardiology through the bariatric center.   He is taking Adderall a few times weekly  on average.  He denies neck pain, allergy symptoms. He has been under more stress recently with work. Feels the tension to his head. His headaches have improved overall since he began using his CPAP machine.   BP Readings from Last 3 Encounters:  06/29/23 120/78  05/05/23 (!) 138/92  02/22/23 138/84     Review of Systems  HENT:  Negative for congestion.   Respiratory:  Negative for shortness of breath.   Cardiovascular:  Negative for chest pain.  Musculoskeletal:  Negative for neck pain.  Neurological:  Positive for headaches.  Psychiatric/Behavioral:  The patient is not nervous/anxious.          Past Medical History:  Diagnosis Date   ADHD (attention deficit hyperactivity disorder)    Anxiety    GERD (gastroesophageal reflux disease)    Headache    migraiones   Hypertension    Lyme disease    Mixed hyperlipidemia    Obstructive sleep apnea on CPAP 11/2022   Home Sleep Test 11/22/2022: Moderate Obstructive Sleep Apnea with AHI of 28.1 and SpO2 low of 64%. => Recommended weight loss, CPAP, oral appliance or surgical assessment. (Epworth Score - 12)   Sinus tachycardia 02/04/2023   Ventral hernia    Viral URI with cough 06/07/2013    Social History   Socioeconomic History   Marital status: Married    Spouse name: Not on file   Number of children: Not on file   Years of education: Not on file   Highest education level: Some college, no degree  Occupational History  Occupation: Event organiser: Production manager  Tobacco Use   Smoking status: Never    Passive exposure: Never   Smokeless tobacco: Never  Vaping Use   Vaping status: Never Used  Substance and Sexual Activity   Alcohol use: Not Currently   Drug use: No   Sexual activity: Not on file  Other Topics Concern   Not on file  Social History Brewing technologist for [employer]   Married 2 kids, 3rd child died of hypoplastic L heart 2012-09-04   Social Determinants of Health   Financial Resource  Strain: Low Risk  (06/28/2023)   Overall Financial Resource Strain (CARDIA)    Difficulty of Paying Living Expenses: Not hard at all  Food Insecurity: No Food Insecurity (06/28/2023)   Hunger Vital Sign    Worried About Running Out of Food in the Last Year: Never true    Ran Out of Food in the Last Year: Never true  Transportation Needs: No Transportation Needs (06/28/2023)   PRAPARE - Administrator, Civil Service (Medical): No    Lack of Transportation (Non-Medical): No  Physical Activity: Insufficiently Active (06/28/2023)   Exercise Vital Sign    Days of Exercise per Week: 2 days    Minutes of Exercise per Session: 30 min  Stress: Stress Concern Present (06/28/2023)   [employer] of Occupational Health - Occupational Stress Questionnaire    Feeling of Stress : Very much  Social Connections: Moderately Isolated (06/28/2023)   Social Connection and Isolation Panel [NHANES]    Frequency of Communication with Friends and Family: More than three times a week    Frequency of Social Gatherings with Friends and Family: Once a week    Attends Religious Services: Never    Database administrator or Organizations: No    Attends Engineer, structural: Not on file    Marital Status: Married  Catering manager Violence: Not on file    Past Surgical History:  Procedure Laterality Date   CHOLECYSTECTOMY N/A 04/08/2016   Procedure: LAPAROSCOPIC CHOLECYSTECTOMY WITH INTRAOPERATIVE CHOLANGIOGRAM;  Surgeon: Ricarda Frame, MD;  Location: [facility] ORS;  Service: General;  Laterality: N/A;   TONSILLECTOMY AND ADENOIDECTOMY  ~1991   XI ROBOTIC ASSISTED VENTRAL HERNIA N/A 12/02/2022   Procedure: XI ROBOTIC ASSISTED VENTRAL HERNIA, incisional hernia;  Surgeon: Henrene Dodge, MD;  Location: [facility] ORS;  Service: General;  Laterality: N/A;    Family History  Problem Relation Age of Onset   Drug abuse Mother    Hypertension Mother    Mental illness Mother    Diabetes Father    Bipolar  disorder Sister    Depression Brother     Allergies  Allergen Reactions   Amoxicillin Hives    Has patient had a PCN reaction causing immediate rash, facial/tongue/throat swelling, SOB or lightheadedness with hypotension: Yes Has patient had a PCN reaction causing severe rash involving mucus membranes or skin necrosis: Yes Has patient had a PCN reaction that required hospitalization No Has patient had a PCN reaction occurring within the last 10 years: No If all of the above answers are "NO", then may proceed with Cephalosporin use.     Current Outpatient Medications on File Prior to Visit  Medication Sig Dispense Refill   acetaminophen (TYLENOL) 500 MG tablet Take 2 tablets (1,000 mg total) by mouth every 6 (six) hours as needed for mild pain.     amphetamine-dextroamphetamine (ADDERALL XR) 20 MG 24 hr capsule Take 1  capsule (20 mg total) by mouth every morning. 30 capsule 0   hydrOXYzine (ATARAX) 10 MG tablet Take 1 tablet (10 mg total) by mouth 2 (two) times daily as needed for anxiety. 60 tablet 0   ibuprofen (ADVIL) 800 MG tablet Take 1 tablet (800 mg total) by mouth every 8 (eight) hours as needed for moderate pain. 60 tablet 1   losartan-hydrochlorothiazide (HYZAAR) 100-25 MG tablet for blood pressure in the morning 90 tablet 1   Melatonin 10 MG TABS Take 10 mg by mouth at bedtime.     naproxen sodium (ALEVE) 220 MG tablet Take 220 mg by mouth daily as needed.     diltiazem (CARDIZEM CD) 120 MG 24 hr capsule Take 1 capsule (120 mg total) by mouth daily at night. (Patient not taking: Reported on 06/29/2023) 90 capsule 3   omeprazole (PRILOSEC) 20 MG capsule TAKE 1 CAPSULE BY MOUTH EVERY DAY for heartburn. (Patient not taking: Reported on 06/29/2023) 90 capsule 3   Vitamin D, Ergocalciferol, (DRISDOL) 1.25 MG (50000 UNIT) CAPS capsule Take 1 capsule by mouth once weekly for 12 weeks. (Patient not taking: Reported on 06/29/2023) 12 capsule 0   No current facility-administered medications  on file prior to visit.    BP 120/78   Pulse 98   Temp (!) 97.3 F (36.3 C) (Temporal)   Ht 5\' 11"  (1.803 m)   Wt (!) 315 lb (142.9 kg)   SpO2 100%   BMI 43.93 kg/m  Objective:   Physical Exam Cardiovascular:     Rate and Rhythm: Normal rate and regular rhythm.  Pulmonary:     Effort: Pulmonary effort is normal.     Breath sounds: Normal breath sounds. No wheezing or rales.  Musculoskeletal:     Cervical back: Neck supple.  Skin:    General: Skin is warm and dry.  Neurological:     Mental Status: He is alert and oriented to person, place, and time.           Assessment & Plan:  Essential hypertension Assessment & Plan: Controlled and improved.  Continue losartan-hydrochlorothiazide 100-25 mg for blood pressure.  Remain off diltiazem 120 mg given that there was no improvement to heart rate.   He has been advised to update [place] with home blood pressure readings via MyChart.   I evaluated patient, was consulted regarding treatment, and agree with assessment and plan per Tenna Delaine, RN, DNP student.   Mayra Reel, NP-C    OSA on CPAP Assessment & Plan: Excellent compliance with CPAP use.   Continue CPAP nightly.    Frequent headaches Assessment & Plan: Recurrent recently. Causes could be from weather changes, increased work stress/tension, lack of sleep.   Discussed lifestyle and diet modifications plus stress reduction.  Resume Trazodone 50 mg HS for sleep to see if this helps.  Consider resuming propranolol 80 mg if no improvement in symptoms.   He will update.  I evaluated patient, was consulted regarding treatment, and agree with assessment and plan per Tenna Delaine, RN, DNP student.   Mayra Reel, NP-C    ADHD, predominantly inattentive type Assessment & Plan: Improving.  He takes Adderall XR 20 mg 1-2 times per week.   He will update.    Insomnia, unspecified type Assessment & Plan: Recently deteriorated. Resume trazodone 50  mg as needed.         Doreene Nest, NP

## 2023-06-29 NOTE — Progress Notes (Signed)
Established Patient Office Visit  Subjective   Patient ID: Rayden Kohring, male    DOB: [birth year]  Age: 37 y.o. MRN: [id number]  Chief Complaint  Patient presents with   Medical Management of Chronic Issues    Has woken up severe days with headaches that feels associated with high BP, also has been lightheaded when standing. He has not checked BP with cuff to confirm.     HPI  Shenouda Motola is a very pleasant 37 y.o. male with a history of hypertension, migraines, OSA, GERD, ADHD, hyperlipidemia, GAD who presents today for headaches and lightheadedness.   In the last 10 days, he has been waking up with headaches 6 of those days. The headaches are in the bilateral occipital region. He has no neck pain. The headaches feel like tension and he believes they may be related to stress. He takes Excedrin Migraine for headaches which improves his symptoms. He was previously managed on propranolol 80 mg and 120 mg. Topamax was discontinued due to side effect of taste changes.    He also has noted 2 episodes of lightheadedness in the last 2 weeks. The episodes have occurred when he rises too quickly from a seated or laying position. He is not checking his blood pressure at home.   He is following with Cardiology. In April 2024, he was prescribed diltiazem 120 mg for tachycardia and instructed to reduce his losartan-hydrochlorothiazide 100-25 to 50-12.5 mg. He took the diltiazem for 2 months but then stopped as it did not help reduce his heart rate. His last dose was early August. He was required to see another cardiologist in late June 2024 for bariatric surgery (scheduled for Oct 2024) clearance, where his BP was 146/98. He was instructed to resume losartan-hydrochlorothiazide 100-25 mg which is the dose that he is currently taking.   He follows with pulmonology for sleep apnea. Good compliance with CPAP. Weight loss was recommended at last office visit.   He takes Adderall 1-2 times per week to focus  in the office.   Review of Systems  Constitutional:  Negative for chills and fever.  Eyes:  Negative for blurred vision.  Respiratory:  Negative for cough and shortness of breath.   Cardiovascular:  Negative for chest pain and palpitations.  Gastrointestinal:  Negative for abdominal pain.  Neurological:  Negative for dizziness and headaches.       No headache today, but has had headaches 6 times in the last 10 days     Objective:     BP 120/78   Pulse 98   Temp (!) 97.3 F (36.3 C) (Temporal)   Ht 5\' 11"  (1.803 m)   Wt (!) 315 lb (142.9 kg)   SpO2 100%   BMI 43.93 kg/m   BP Readings from Last 3 Encounters:  06/29/23 120/78  05/05/23 (!) 138/92  02/22/23 138/84   Wt Readings from Last 3 Encounters:  06/29/23 (!) 315 lb (142.9 kg)  05/05/23 (!) 318 lb (144.2 kg)  02/22/23 (!) 326 lb 3.2 oz (148 kg)     Physical Exam Cardiovascular:     Rate and Rhythm: Normal rate and regular rhythm.  Pulmonary:     Effort: Pulmonary effort is normal.     Breath sounds: Normal breath sounds.  Skin:    General: Skin is warm and dry.  Neurological:     Mental Status: He is alert.    The ASCVD Risk score (Arnett DK, et al., 2019) failed to calculate for the following  reasons:   The 2019 ASCVD risk score is only valid for ages 71 to 11    Assessment & Plan:   Problem List Items Addressed This Visit       Cardiovascular and Mediastinum   Essential hypertension - Primary (Chronic)    Controlled and improved.  Continue losartan-hydrochlorothiazide 100-25 mg for blood pressure.  Remain off diltiazem 120 mg given that there was no improvement to heart rate.   Please update [place] with home blood pressure readings via MyChart.         Respiratory   OSA on CPAP    Excellent compliance with CPAP use.   Continue CPAP nightly.         Other   Frequent headaches    Recurrent.  Currently managed with OTC Excedrin Migraine. Headaches have improved greatly with CPAP use.    Discussed lifestyle and diet modifications plus stress reduction.   Consider resuming propranolol 80 mg if no improvement in symptoms.       ADHD, predominantly inattentive type    Improving.  He takes Adderall XR 20 mg 1-2 times per week.   He will update.        Benito Mccreedy, RN

## 2023-06-29 NOTE — Patient Instructions (Addendum)
Continue losartan-hydrochlorothiazide 100-25 mg for blood pressure.  Please update [place] with home blood pressure readings via MyChart.   It was a pleasure to see you today!

## 2023-06-29 NOTE — Assessment & Plan Note (Deleted)
Recurrent.  Currently managed with OTC Excedrin Migraine.   Discussed lifestyle and diet modifications plus stress reduction.   Consider resuming propranolol 80 mg if no improvement in symptoms.

## 2023-06-29 NOTE — Assessment & Plan Note (Signed)
Excellent compliance with CPAP use.   Continue CPAP nightly.

## 2023-06-29 NOTE — Assessment & Plan Note (Addendum)
Recurrent recently. Causes could be from weather changes, increased work stress/tension, lack of sleep.   Discussed lifestyle and diet modifications plus stress reduction.  Resume Trazodone 50 mg HS for sleep to see if this helps.  Consider resuming propranolol 80 mg if no improvement in symptoms.   He will update.  I evaluated patient, was consulted regarding treatment, and agree with assessment and plan per Tenna Delaine, RN, DNP student.   Mayra Reel, NP-C

## 2023-06-29 NOTE — Assessment & Plan Note (Addendum)
Controlled and improved.  Continue losartan-hydrochlorothiazide 100-25 mg for blood pressure.  Remain off diltiazem 120 mg given that there was no improvement to heart rate.   He has been advised to update [place] with home blood pressure readings via MyChart.   I evaluated patient, was consulted regarding treatment, and agree with assessment and plan per Tenna Delaine, RN, DNP student.   Mayra Reel, NP-C

## 2023-07-03 DIAGNOSIS — G4733 Obstructive sleep apnea (adult) (pediatric): Secondary | ICD-10-CM | POA: Diagnosis not present

## 2023-07-12 HISTORY — PX: OTHER SURGICAL HISTORY: SHX169

## 2023-07-18 ENCOUNTER — Other Ambulatory Visit: Payer: Self-pay

## 2023-07-27 DIAGNOSIS — I10 Essential (primary) hypertension: Secondary | ICD-10-CM | POA: Diagnosis not present

## 2023-07-27 DIAGNOSIS — Z6841 Body Mass Index (BMI) 40.0 and over, adult: Secondary | ICD-10-CM | POA: Diagnosis not present

## 2023-07-27 DIAGNOSIS — G4733 Obstructive sleep apnea (adult) (pediatric): Secondary | ICD-10-CM | POA: Diagnosis not present

## 2023-07-29 ENCOUNTER — Other Ambulatory Visit: Payer: Self-pay | Admitting: Primary Care

## 2023-07-29 DIAGNOSIS — E559 Vitamin D deficiency, unspecified: Secondary | ICD-10-CM

## 2023-08-01 DIAGNOSIS — Z01818 Encounter for other preprocedural examination: Secondary | ICD-10-CM | POA: Diagnosis not present

## 2023-08-02 DIAGNOSIS — G4733 Obstructive sleep apnea (adult) (pediatric): Secondary | ICD-10-CM | POA: Diagnosis not present

## 2023-08-08 DIAGNOSIS — Z79899 Other long term (current) drug therapy: Secondary | ICD-10-CM | POA: Diagnosis not present

## 2023-08-08 DIAGNOSIS — K760 Fatty (change of) liver, not elsewhere classified: Secondary | ICD-10-CM | POA: Diagnosis not present

## 2023-08-08 DIAGNOSIS — F419 Anxiety disorder, unspecified: Secondary | ICD-10-CM | POA: Diagnosis not present

## 2023-08-08 DIAGNOSIS — Z9049 Acquired absence of other specified parts of digestive tract: Secondary | ICD-10-CM | POA: Diagnosis not present

## 2023-08-08 DIAGNOSIS — Z9889 Other specified postprocedural states: Secondary | ICD-10-CM | POA: Diagnosis not present

## 2023-08-08 DIAGNOSIS — Z6841 Body Mass Index (BMI) 40.0 and over, adult: Secondary | ICD-10-CM | POA: Diagnosis not present

## 2023-08-08 DIAGNOSIS — G4733 Obstructive sleep apnea (adult) (pediatric): Secondary | ICD-10-CM | POA: Diagnosis not present

## 2023-08-08 DIAGNOSIS — I10 Essential (primary) hypertension: Secondary | ICD-10-CM | POA: Diagnosis not present

## 2023-08-08 DIAGNOSIS — E782 Mixed hyperlipidemia: Secondary | ICD-10-CM | POA: Diagnosis not present

## 2023-08-08 DIAGNOSIS — K660 Peritoneal adhesions (postprocedural) (postinfection): Secondary | ICD-10-CM | POA: Diagnosis not present

## 2023-08-08 DIAGNOSIS — Z880 Allergy status to penicillin: Secondary | ICD-10-CM | POA: Diagnosis not present

## 2023-08-08 DIAGNOSIS — F32A Depression, unspecified: Secondary | ICD-10-CM | POA: Diagnosis not present

## 2023-08-29 DIAGNOSIS — E669 Obesity, unspecified: Secondary | ICD-10-CM | POA: Diagnosis not present

## 2023-08-29 DIAGNOSIS — Z713 Dietary counseling and surveillance: Secondary | ICD-10-CM | POA: Diagnosis not present

## 2023-08-29 DIAGNOSIS — Z9884 Bariatric surgery status: Secondary | ICD-10-CM | POA: Diagnosis not present

## 2023-09-02 DIAGNOSIS — G4733 Obstructive sleep apnea (adult) (pediatric): Secondary | ICD-10-CM | POA: Diagnosis not present

## 2023-09-15 ENCOUNTER — Encounter: Payer: Self-pay | Admitting: Primary Care

## 2023-09-15 ENCOUNTER — Ambulatory Visit (INDEPENDENT_AMBULATORY_CARE_PROVIDER_SITE_OTHER): Payer: BC Managed Care – PPO | Admitting: Primary Care

## 2023-09-15 VITALS — BP 108/68 | HR 82 | Temp 97.0°F | Ht 71.0 in | Wt 273.0 lb

## 2023-09-15 DIAGNOSIS — Z0000 Encounter for general adult medical examination without abnormal findings: Secondary | ICD-10-CM | POA: Diagnosis not present

## 2023-09-15 DIAGNOSIS — E559 Vitamin D deficiency, unspecified: Secondary | ICD-10-CM

## 2023-09-15 DIAGNOSIS — R000 Tachycardia, unspecified: Secondary | ICD-10-CM

## 2023-09-15 DIAGNOSIS — I10 Essential (primary) hypertension: Secondary | ICD-10-CM | POA: Diagnosis not present

## 2023-09-15 DIAGNOSIS — F900 Attention-deficit hyperactivity disorder, predominantly inattentive type: Secondary | ICD-10-CM

## 2023-09-15 DIAGNOSIS — G43009 Migraine without aura, not intractable, without status migrainosus: Secondary | ICD-10-CM | POA: Diagnosis not present

## 2023-09-15 DIAGNOSIS — E66812 Obesity, class 2: Secondary | ICD-10-CM

## 2023-09-15 DIAGNOSIS — Z6838 Body mass index (BMI) 38.0-38.9, adult: Secondary | ICD-10-CM

## 2023-09-15 DIAGNOSIS — F411 Generalized anxiety disorder: Secondary | ICD-10-CM

## 2023-09-15 DIAGNOSIS — G4733 Obstructive sleep apnea (adult) (pediatric): Secondary | ICD-10-CM

## 2023-09-15 DIAGNOSIS — K219 Gastro-esophageal reflux disease without esophagitis: Secondary | ICD-10-CM

## 2023-09-15 LAB — LIPID PANEL
Cholesterol: 143 mg/dL (ref 0–200)
HDL: 25.5 mg/dL — ABNORMAL LOW (ref >39.00)
LDL Cholesterol: 79 mg/dL (ref 0–99)
NonHDL: 117.98
Total CHOL/HDL Ratio: 6
Triglycerides: 196 mg/dL — ABNORMAL HIGH (ref 0.0–149.0)
VLDL: 39.2 mg/dL (ref 0.0–40.0)

## 2023-09-15 LAB — VITAMIN D 25 HYDROXY (VIT D DEFICIENCY, FRACTURES): VITD: 53.04 ng/mL (ref 30.00–100.00)

## 2023-09-15 NOTE — Assessment & Plan Note (Signed)
Commended him on weight loss!!  BP controlled. Remain off losartan-hydrochlorothiazide 100-25 mg daily.

## 2023-09-15 NOTE — Assessment & Plan Note (Signed)
Controlled.  Continue CPAP nightly which has helped.

## 2023-09-15 NOTE — Assessment & Plan Note (Signed)
Continue nightly CPAP machine.

## 2023-09-15 NOTE — Patient Instructions (Signed)
Stop by the lab prior to leaving today. I will notify you of your results once received.   It was a pleasure to see you today!  

## 2023-09-15 NOTE — Assessment & Plan Note (Signed)
Immunizations UTD. Declines influenza vaccine.  Commended him on weight loss.  Exam stable. Labs pending.  Follow up in 1 year for repeat physical.

## 2023-09-15 NOTE — Assessment & Plan Note (Signed)
Only taking multivitamin daily. Repeat vitamin D level pending.

## 2023-09-15 NOTE — Progress Notes (Signed)
Subjective:    Patient ID: Gerald [name], male    DOB: 12-Jan-1986, 37 y.o.   MRN: [id number]  HPI  Gerald [name] is a very pleasant 37 y.o. male who presents today for complete physical and follow up of chronic conditions.  Immunizations: -Tetanus: Completed in 2012-09-22 -Influenza: Declines influenza vaccine.   Diet: Fair diet.  Exercise: No regular exercise.  Eye exam: Completes annually  Dental exam: Completes semi-annually    Wt Readings from Last 3 Encounters:  09/15/23 273 lb (123.8 kg)  06/29/23 (!) 315 lb (142.9 kg)  05/05/23 (!) 318 lb (144.2 kg)    BP Readings from Last 3 Encounters:  09/15/23 108/68  06/29/23 120/78  05/05/23 (!) 138/92   Body mass index is 38.08 kg/m.   Review of Systems  Constitutional:  Negative for unexpected weight change.  HENT:  Negative for rhinorrhea.   Respiratory:  Negative for cough and shortness of breath.   Cardiovascular:  Negative for chest pain.  Gastrointestinal:  Negative for constipation and diarrhea.  Genitourinary:  Negative for difficulty urinating.  Musculoskeletal:  Negative for arthralgias and myalgias.  Skin:  Negative for rash.  Allergic/Immunologic: Negative for environmental allergies.  Neurological:  Negative for dizziness, numbness and headaches.  Psychiatric/Behavioral:  The patient is not nervous/anxious.          Past Medical History:  Diagnosis Date   ADHD (attention deficit hyperactivity disorder)    Anxiety    BRBPR (bright red blood per rectum) 07/24/2019   DOE (dyspnea on exertion) 02/04/2023   Epigastric abdominal pain 07/24/2019   Fatigue 11/09/2011   GERD (gastroesophageal reflux disease)    Headache    migraiones   Hypertension    Lyme disease    Mixed hyperlipidemia    Obstructive sleep apnea on CPAP 11/2022   Home Sleep Test 11/22/2022: Moderate Obstructive Sleep Apnea with AHI of 28.1 and SpO2 low of 64%. => Recommended weight loss, CPAP, oral appliance or surgical assessment.  (Epworth Score - 12)   Right upper quadrant abdominal mass 09/23/2021   Scrotal mass 08/26/2016   Sinus tachycardia 02/04/2023   Ventral hernia    Viral URI with cough 06/07/2013    Social History   Socioeconomic History   Marital status: Married    Spouse name: Not on file   Number of children: Not on file   Years of education: Not on file   Highest education level: Some college, no degree  Occupational History   Occupation: Event organiser: Production manager  Tobacco Use   Smoking status: Never    Passive exposure: Never   Smokeless tobacco: Never  Vaping Use   Vaping status: Never Used  Substance and Sexual Activity   Alcohol use: Not Currently   Drug use: No   Sexual activity: Not on file  Other Topics Concern   Not on file  Social History Brewing technologist for [employer]   Married 2 kids, 3rd child died of hypoplastic L heart September 22, 2012   Social Determinants of Health   Financial Resource Strain: Low Risk  (09/14/2023)   Overall Financial Resource Strain (CARDIA)    Difficulty of Paying Living Expenses: Not hard at all  Food Insecurity: No Food Insecurity (09/14/2023)   Hunger Vital Sign    Worried About Running Out of Food in the Last Year: Never true    Ran Out of Food in the Last Year: Never true  Transportation Needs: No Transportation Needs (09/14/2023)  PRAPARE - Administrator, Civil Service (Medical): No    Lack of Transportation (Non-Medical): No  Physical Activity: Sufficiently Active (09/14/2023)   Exercise Vital Sign    Days of Exercise per Week: 7 days    Minutes of Exercise per Session: 30 min  Recent Concern: Physical Activity - Insufficiently Active (06/28/2023)   Exercise Vital Sign    Days of Exercise per Week: 2 days    Minutes of Exercise per Session: 30 min  Stress: No Stress Concern Present (09/14/2023)   [employer] of Occupational Health - Occupational Stress Questionnaire    Feeling of Stress : Only a little   Recent Concern: Stress - Stress Concern Present (06/28/2023)   [employer] of Occupational Health - Occupational Stress Questionnaire    Feeling of Stress : Very much  Social Connections: Moderately Isolated (09/14/2023)   Social Connection and Isolation Panel [NHANES]    Frequency of Communication with Friends and Family: More than three times a week    Frequency of Social Gatherings with Friends and Family: Once a week    Attends Religious Services: Never    Database administrator or Organizations: No    Attends Engineer, structural: Not on file    Marital Status: Married  Catering manager Violence: Not on file    Past Surgical History:  Procedure Laterality Date   Bariatic surgery  07/2023   CHOLECYSTECTOMY N/A 04/08/2016   Procedure: LAPAROSCOPIC CHOLECYSTECTOMY WITH INTRAOPERATIVE CHOLANGIOGRAM;  Surgeon: Ricarda Frame, MD;  Location: [facility] ORS;  Service: General;  Laterality: N/A;   TONSILLECTOMY AND ADENOIDECTOMY  ~1991   XI ROBOTIC ASSISTED VENTRAL HERNIA N/A 12/02/2022   Procedure: XI ROBOTIC ASSISTED VENTRAL HERNIA, incisional hernia;  Surgeon: Henrene Dodge, MD;  Location: [facility] ORS;  Service: General;  Laterality: N/A;    Family History  Problem Relation Age of Onset   Drug abuse Mother    Hypertension Mother    Mental illness Mother    Diabetes Father    Bipolar disorder Sister    Depression Brother     Allergies  Allergen Reactions   Amoxicillin Hives    Has patient had a PCN reaction causing immediate rash, facial/tongue/throat swelling, SOB or lightheadedness with hypotension: Yes Has patient had a PCN reaction causing severe rash involving mucus membranes or skin necrosis: Yes Has patient had a PCN reaction that required hospitalization No Has patient had a PCN reaction occurring within the last 10 years: No If all of the above answers are "NO", then may proceed with Cephalosporin use.     Current Outpatient Medications on File Prior to  Visit  Medication Sig Dispense Refill   acetaminophen (TYLENOL) 500 MG tablet Take 2 tablets (1,000 mg total) by mouth every 6 (six) hours as needed for mild pain.     amphetamine-dextroamphetamine (ADDERALL XR) 20 MG 24 hr capsule Take 1 capsule (20 mg total) by mouth every morning. 30 capsule 0   hydrOXYzine (ATARAX) 10 MG tablet Take 1 tablet (10 mg total) by mouth 2 (two) times daily as needed for anxiety. 60 tablet 0   Melatonin 10 MG TABS Take 10 mg by mouth at bedtime.     pantoprazole (PROTONIX) 20 MG tablet Take 20 mg by mouth daily.     No current facility-administered medications on file prior to visit.    BP 108/68   Pulse 82   Temp (!) 97 F (36.1 C) (Temporal)   Ht 5\' 11"  (1.803 m)  Wt 273 lb (123.8 kg)   SpO2 97%   BMI 38.08 kg/m  Objective:   Physical Exam HENT:     Right Ear: Tympanic membrane and ear canal normal.     Left Ear: Tympanic membrane and ear canal normal.  Eyes:     Pupils: Pupils are equal, round, and reactive to light.  Cardiovascular:     Rate and Rhythm: Normal rate and regular rhythm.  Pulmonary:     Effort: Pulmonary effort is normal.     Breath sounds: Normal breath sounds.  Abdominal:     General: Bowel sounds are normal.     Palpations: Abdomen is soft.     Tenderness: There is no abdominal tenderness.  Musculoskeletal:        General: Normal range of motion.     Cervical back: Neck supple.  Skin:    General: Skin is warm and dry.  Neurological:     Mental Status: He is alert and oriented to person, place, and time.     Cranial Nerves: No cranial nerve deficit.     Deep Tendon Reflexes:     Reflex Scores:      Patellar reflexes are 2+ on the right side and 2+ on the left side. Psychiatric:        Mood and Affect: Mood normal.           Assessment & Plan:  Preventative health care Assessment & Plan: Immunizations UTD. Declines influenza vaccine.  Commended him on weight loss!  Exam stable. Labs pending.  Follow  up in 1 year for repeat physical.    OSA on CPAP Assessment & Plan: Continue nightly CPAP machine.    Essential hypertension Assessment & Plan: Commended him on weight loss!!  BP controlled. Remain off losartan-hydrochlorothiazide 100-25 mg daily.  Orders: -     Lipid panel  Migraine without aura and without status migrainosus, not intractable Assessment & Plan: Controlled.  Continue CPAP nightly which has helped.    Gastroesophageal reflux disease, unspecified whether esophagitis present Assessment & Plan: Controlled.  Continue pantoprazole 20 mg daily.   Vitamin D deficiency Assessment & Plan: Only taking multivitamin daily. Repeat vitamin D level pending.  Orders: -     VITAMIN D 25 Hydroxy (Vit-D Deficiency, Fractures)  Sinus tachycardia Assessment & Plan: Improved and controlled with weight loss. Remain off diltiazem 120 mg daily.  Reviewed cardiology notes from May 2024.   Class 2 severe obesity due to excess calories with serious comorbidity and body mass index (BMI) of 38.0 to 38.9 in adult [facility]) Assessment & Plan: Commended him on weight loss! S/P VSG Sleeve in October 2024.  Continue with exercise. Continue to work on diet.   GAD (generalized anxiety disorder) Assessment & Plan: Controlled.  Continue to monitor.  Continue hydroxyzine 10 mg PRN.   ADHD, predominantly inattentive type Assessment & Plan: Controlled.  Continue Adderall XR 20 mg for which he uses as needed.          Doreene Nest, NP

## 2023-09-15 NOTE — Assessment & Plan Note (Signed)
Controlled.  Continue Adderall XR 20 mg for which he uses as needed.

## 2023-09-15 NOTE — Assessment & Plan Note (Signed)
Improved and controlled with weight loss. Remain off diltiazem 120 mg daily.  Reviewed cardiology notes from May 2024.

## 2023-09-15 NOTE — Assessment & Plan Note (Signed)
Commended him on weight loss! S/P VSG Sleeve in October 2024.  Continue with exercise. Continue to work on diet.

## 2023-09-15 NOTE — Assessment & Plan Note (Addendum)
Controlled.  Continue to monitor.  Continue hydroxyzine 10 mg PRN.

## 2023-09-15 NOTE — Assessment & Plan Note (Signed)
Controlled.  Continue pantoprazole 20 mg daily.  

## 2023-10-02 DIAGNOSIS — G4733 Obstructive sleep apnea (adult) (pediatric): Secondary | ICD-10-CM | POA: Diagnosis not present

## 2023-11-23 ENCOUNTER — Ambulatory Visit: Payer: Self-pay | Admitting: Primary Care

## 2023-11-23 ENCOUNTER — Encounter: Payer: Self-pay | Admitting: Primary Care

## 2023-11-23 VITALS — BP 122/70 | HR 80 | Temp 97.3°F | Ht 71.0 in | Wt 238.0 lb

## 2023-11-23 DIAGNOSIS — F411 Generalized anxiety disorder: Secondary | ICD-10-CM

## 2023-11-23 DIAGNOSIS — F900 Attention-deficit hyperactivity disorder, predominantly inattentive type: Secondary | ICD-10-CM

## 2023-11-23 MED ORDER — AMPHETAMINE-DEXTROAMPHET ER 20 MG PO CP24: 20.0000 mg | ORAL_CAPSULE | ORAL | 0 refills | Status: AC

## 2023-11-23 MED ORDER — FLUOXETINE HCL 20 MG PO TABS
20.0000 mg | ORAL_TABLET | Freq: Every day | ORAL | 0 refills | Status: DC
Start: 2023-11-23 — End: 2023-11-25

## 2023-11-23 MED ORDER — HYDROXYZINE HCL 10 MG PO TABS: 10.0000 mg | ORAL_TABLET | Freq: Two times a day (BID) | ORAL | 0 refills | Status: DC | PRN

## 2023-11-23 NOTE — Patient Instructions (Signed)
Start fluoxetine (Prozac) 20 mg daily for anxiety. Take 1/2 tablet daily for 1 week, then increase to 1 full tablet thereafter.   Use the hydroxyzine as needed.  Please schedule a follow up visit for 6 weeks for follow up of anxiety. This can be a virtual visit.  It was a pleasure to see you today!

## 2023-11-23 NOTE — Progress Notes (Signed)
Subjective:    Patient ID: Gerald [name], male    DOB: 1986-06-26, 38 y.o.   MRN: [id number]  Anxiety Symptoms include nervous/anxious behavior. Patient reports no chest pain or shortness of breath.      Gerald [name] is a very pleasant 38 y.o. male with a history of hypertension, migraines, OSA, gastric bypass surgery, GAD, ADHD who presents today to discuss anxiety.  Symptoms began around May 2024 when his company was bought out. Symptoms became worse in late January 2025 when his new boss applied some increased stress which could cause a loss of his position.   Symptoms of anxiety include worrying, feeling overwhelmed, mind racing thoughts, worst case scenario.   Previously managed on Lexapro at 10 and 20 mg doses, citalopram at 20 mg for anxiety. He believes he needs treatment again. He didn't like the way he felt on Lexapro. He felt improved on citalopram but felt almost too relaxed. He has done therapy in the past, didn't connect well.   He is also needing refills of his Adderall for which he takes as needed.   Wt Readings from Last 3 Encounters:  11/23/23 238 lb (108 kg)  09/15/23 273 lb (123.8 kg)  06/29/23 (!) 315 lb (142.9 kg)   BP Readings from Last 3 Encounters:  11/23/23 122/70  09/15/23 108/68  06/29/23 120/78      Review of Systems  Respiratory:  Negative for shortness of breath.   Cardiovascular:  Negative for chest pain.  Psychiatric/Behavioral:  The patient is nervous/anxious.        See HPI         Past Medical History:  Diagnosis Date   ADHD (attention deficit hyperactivity disorder)    Anxiety    BRBPR (bright red blood per rectum) 07/24/2019   DOE (dyspnea on exertion) 02/04/2023   Epigastric abdominal pain 07/24/2019   Fatigue 11/09/2011   GERD (gastroesophageal reflux disease)    Headache    migraiones   Hypertension    Lyme disease    Mixed hyperlipidemia    Obstructive sleep apnea on CPAP December 08, 2022   Home Sleep Test 11/22/2022: Moderate  Obstructive Sleep Apnea with AHI of 28.1 and SpO2 low of 64%. => Recommended weight loss, CPAP, oral appliance or surgical assessment. (Epworth Score - 12)   Right upper quadrant abdominal mass 09/23/2021   Scrotal mass 08/26/2016   Sinus tachycardia 02/04/2023   Ventral hernia    Viral URI with cough 06/07/2013    Social History   Socioeconomic History   Marital status: Married    Spouse name: Not on file   Number of children: Not on file   Years of education: Not on file   Highest education level: Some college, no degree  Occupational History   Occupation: Event organiser: Production manager  Tobacco Use   Smoking status: Never    Passive exposure: Never   Smokeless tobacco: Never  Vaping Use   Vaping status: Never Used  Substance and Sexual Activity   Alcohol use: Not Currently   Drug use: No   Sexual activity: Not on file  Other Topics Concern   Not on file  Social History Narrative   Agricultural consultant for [employer]   Married 2 kids, 3rd child died of hypoplastic L heart Dec 09, 2011   Social Drivers of Health   Financial Resource Strain: Low Risk  (09/14/2023)   Overall Financial Resource Strain (CARDIA)    Difficulty of Paying Living Expenses: Not hard at  all  Food Insecurity: No Food Insecurity (09/14/2023)   Hunger Vital Sign    Worried About Running Out of Food in the Last Year: Never true    Ran Out of Food in the Last Year: Never true  Transportation Needs: No Transportation Needs (09/14/2023)   PRAPARE - Administrator, Civil Service (Medical): No    Lack of Transportation (Non-Medical): No  Physical Activity: Sufficiently Active (09/14/2023)   Exercise Vital Sign    Days of Exercise per Week: 7 days    Minutes of Exercise per Session: 30 min  Recent Concern: Physical Activity - Insufficiently Active (06/28/2023)   Exercise Vital Sign    Days of Exercise per Week: 2 days    Minutes of Exercise per Session: 30 min  Stress: No Stress Concern Present  (09/14/2023)   [employer] of Occupational Health - Occupational Stress Questionnaire    Feeling of Stress : Only a little  Recent Concern: Stress - Stress Concern Present (06/28/2023)   [employer] of Occupational Health - Occupational Stress Questionnaire    Feeling of Stress : Very much  Social Connections: Moderately Isolated (09/14/2023)   Social Connection and Isolation Panel [NHANES]    Frequency of Communication with Friends and Family: More than three times a week    Frequency of Social Gatherings with Friends and Family: Once a week    Attends Religious Services: Never    Database administrator or Organizations: No    Attends Engineer, structural: Not on file    Marital Status: Married  Catering manager Violence: Not on file    Past Surgical History:  Procedure Laterality Date   Bariatic surgery  07/2023   CHOLECYSTECTOMY N/A 04/08/2016   Procedure: LAPAROSCOPIC CHOLECYSTECTOMY WITH INTRAOPERATIVE CHOLANGIOGRAM;  Surgeon: Ricarda Frame, MD;  Location: [facility] ORS;  Service: General;  Laterality: N/A;   TONSILLECTOMY AND ADENOIDECTOMY  ~1991   XI ROBOTIC ASSISTED VENTRAL HERNIA N/A 12/02/2022   Procedure: XI ROBOTIC ASSISTED VENTRAL HERNIA, incisional hernia;  Surgeon: Henrene Dodge, MD;  Location: [facility] ORS;  Service: General;  Laterality: N/A;    Family History  Problem Relation Age of Onset   Drug abuse Mother    Hypertension Mother    Mental illness Mother    Diabetes Father    Bipolar disorder Sister    Depression Brother     Allergies  Allergen Reactions   Amoxicillin Hives    Has patient had a PCN reaction causing immediate rash, facial/tongue/throat swelling, SOB or lightheadedness with hypotension: Yes Has patient had a PCN reaction causing severe rash involving mucus membranes or skin necrosis: Yes Has patient had a PCN reaction that required hospitalization No Has patient had a PCN reaction occurring within the last 10 years: No If all  of the above answers are "NO", then may proceed with Cephalosporin use.     Current Outpatient Medications on File Prior to Visit  Medication Sig Dispense Refill   acetaminophen (TYLENOL) 500 MG tablet Take 2 tablets (1,000 mg total) by mouth every 6 (six) hours as needed for mild pain.     Melatonin 10 MG TABS Take 10 mg by mouth at bedtime.     pantoprazole (PROTONIX) 20 MG tablet Take 20 mg by mouth daily. (Patient not taking: Reported on 11/23/2023)     No current facility-administered medications on file prior to visit.    BP 122/70   Pulse 80   Temp (!) 97.3 F (36.3 C) (Temporal)  Ht 5\' 11"  (1.803 m)   Wt 238 lb (108 kg)   SpO2 98%   BMI 33.19 kg/m  Objective:   Physical Exam Cardiovascular:     Rate and Rhythm: Normal rate and regular rhythm.  Pulmonary:     Effort: Pulmonary effort is normal.     Breath sounds: Normal breath sounds.  Musculoskeletal:     Cervical back: Neck supple.  Skin:    General: Skin is warm and dry.  Neurological:     Mental Status: He is alert and oriented to person, place, and time.  Psychiatric:        Mood and Affect: Mood normal.           Assessment & Plan:  GAD (generalized anxiety disorder) Assessment & Plan: Deteriorated.   Discussed options for treatment.  Start fluoxetine 20 mg daily.   Discussed potential side effects. Follow up in 4-6 weeks.   Orders: -     FLUoxetine HCl; Take 1 tablet (20 mg total) by mouth daily. For anxiety  Dispense: 90 tablet; Refill: 0 -     hydrOXYzine HCl; Take 1 tablet (10 mg total) by mouth 2 (two) times daily as needed for anxiety.  Dispense: 60 tablet; Refill: 0  ADHD, predominantly inattentive type -     Amphetamine-Dextroamphet ER; Take 1 capsule (20 mg total) by mouth every morning.  Dispense: 30 capsule; Refill: 0        Doreene Nest, NP

## 2023-11-23 NOTE — Assessment & Plan Note (Signed)
Deteriorated.   Discussed options for treatment.  Start fluoxetine 20 mg daily.   Discussed potential side effects. Follow up in 4-6 weeks.

## 2023-11-25 MED ORDER — FLUOXETINE HCL 20 MG PO CAPS: 20.0000 mg | ORAL_CAPSULE | Freq: Every day | ORAL | 0 refills | Status: DC

## 2023-12-09 ENCOUNTER — Encounter: Payer: Self-pay | Admitting: Primary Care

## 2023-12-09 ENCOUNTER — Ambulatory Visit: Payer: BC Managed Care – PPO | Admitting: Primary Care

## 2023-12-09 VITALS — BP 102/60 | HR 70 | Temp 97.1°F | Ht 71.0 in | Wt 231.0 lb

## 2023-12-09 DIAGNOSIS — R229 Localized swelling, mass and lump, unspecified: Secondary | ICD-10-CM | POA: Diagnosis not present

## 2023-12-09 NOTE — Patient Instructions (Signed)
 You will receive a phone call regarding the soft tissue ultrasound.  It was a pleasure to see you today!

## 2023-12-09 NOTE — Assessment & Plan Note (Signed)
 Evident on exam.  Differentials include lipoma versus cyst. No evidence of herpes zoster. Lower suspicion for spine degeneration causes given HPI.  Soft tissue ultrasound ordered and pending. Await results.

## 2023-12-09 NOTE — Progress Notes (Signed)
 Subjective:    Patient ID: Gerald [name], male    DOB: Mar 09, 1986, 38 y.o.   MRN: [id number]  HPI  Gerald [name] is a very pleasant 38 y.o. male with a history of hypertension, migraines, OSA, GERD, sinus tachycardia, ADHD, hyperlipidemia, obesity, GAD who presents today to discuss skin mass.  His mass is located to the right lower portion of the thoracic spine. Chronic for years.   A few weeks ago he's noticed pain to the site of the mass which bothers him when sitting. He notices his pain when sitting and standing. His wife massaged his back a few days ago which helped. He's recently added insoles to his shoes which helped initially, but symptoms have returned.   He describes his pain as tight and achy. He denies pain elsewhere to his back, skin color changes, rash, injury. He is more active, is exercising more. Isn't sure if the weight loss has contributed to him noticing the pain and the mass.    Review of Systems  Musculoskeletal:  Positive for back pain.  Skin:  Negative for color change and rash.         Past Medical History:  Diagnosis Date   ADHD (attention deficit hyperactivity disorder)    Anxiety    BRBPR (bright red blood per rectum) 07/24/2019   DOE (dyspnea on exertion) 02/04/2023   Epigastric abdominal pain 07/24/2019   Fatigue 11/09/2011   GERD (gastroesophageal reflux disease)    Headache    migraiones   Hypertension    Lyme disease    Mixed hyperlipidemia    Obstructive sleep apnea on CPAP 11/2022   Home Sleep Test 11/22/2022: Moderate Obstructive Sleep Apnea with AHI of 28.1 and SpO2 low of 64%. => Recommended weight loss, CPAP, oral appliance or surgical assessment. (Epworth Score - 12)   Right upper quadrant abdominal mass 09/23/2021   Scrotal mass 08/26/2016   Sinus tachycardia 02/04/2023   Ventral hernia    Viral URI with cough 06/07/2013    Social History   Socioeconomic History   Marital status: Married    Spouse name: Not on file   Number  of children: Not on file   Years of education: Not on file   Highest education level: Some college, no degree  Occupational History   Occupation: Event organiser: Production manager  Tobacco Use   Smoking status: Never    Passive exposure: Never   Smokeless tobacco: Never  Vaping Use   Vaping status: Never Used  Substance and Sexual Activity   Alcohol use: Not Currently   Drug use: No   Sexual activity: Not on file  Other Topics Concern   Not on file  Social History Brewing technologist for [employer]   Married 2 kids, 3rd child died of hypoplastic L heart 12/23/2011   Social Drivers of Health   Financial Resource Strain: Low Risk  (09/14/2023)   Overall Financial Resource Strain (CARDIA)    Difficulty of Paying Living Expenses: Not hard at all  Food Insecurity: No Food Insecurity (09/14/2023)   Hunger Vital Sign    Worried About Running Out of Food in the Last Year: Never true    Ran Out of Food in the Last Year: Never true  Transportation Needs: No Transportation Needs (09/14/2023)   PRAPARE - Administrator, Civil Service (Medical): No    Lack of Transportation (Non-Medical): No  Physical Activity: Sufficiently Active (09/14/2023)   Exercise Vital Sign  Days of Exercise per Week: 7 days    Minutes of Exercise per Session: 30 min  Recent Concern: Physical Activity - Insufficiently Active (06/28/2023)   Exercise Vital Sign    Days of Exercise per Week: 2 days    Minutes of Exercise per Session: 30 min  Stress: No Stress Concern Present (09/14/2023)   [employer] of Occupational Health - Occupational Stress Questionnaire    Feeling of Stress : Only a little  Recent Concern: Stress - Stress Concern Present (06/28/2023)   [employer] of Occupational Health - Occupational Stress Questionnaire    Feeling of Stress : Very much  Social Connections: Moderately Isolated (09/14/2023)   Social Connection and Isolation Panel [NHANES]    Frequency of  Communication with Friends and Family: More than three times a week    Frequency of Social Gatherings with Friends and Family: Once a week    Attends Religious Services: Never    Database administrator or Organizations: No    Attends Engineer, structural: Not on file    Marital Status: Married  Catering manager Violence: Not on file    Past Surgical History:  Procedure Laterality Date   Bariatic surgery  07/2023   CHOLECYSTECTOMY N/A 04/08/2016   Procedure: LAPAROSCOPIC CHOLECYSTECTOMY WITH INTRAOPERATIVE CHOLANGIOGRAM;  Surgeon: Ricarda Frame, MD;  Location: [facility] ORS;  Service: General;  Laterality: N/A;   TONSILLECTOMY AND ADENOIDECTOMY  ~1991   XI ROBOTIC ASSISTED VENTRAL HERNIA N/A 12/02/2022   Procedure: XI ROBOTIC ASSISTED VENTRAL HERNIA, incisional hernia;  Surgeon: Henrene Dodge, MD;  Location: [facility] OR[facility];  Service: General;  Laterality: N/A;    Family History  Problem Relation Age of Onset   Drug abuse Mother    Hypertension Mother    Mental illness Mother    Diabetes Father    Bipolar disorder Sister    Depression Brother     Allergies  Allergen Reactions   Amoxicillin Hives    Has patient had a PCN reaction causing immediate rash, facial/tongue/throat swelling, SOB or lightheadedness with hypotension: Yes Has patient had a PCN reaction causing severe rash involving mucus membranes or skin necrosis: Yes Has patient had a PCN reaction that required hospitalization No Has patient had a PCN reaction occurring within the last 10 years: No If all of the above answers are "NO", then may proceed with Cephalosporin use.     Current Outpatient Medications on File Prior to Visit  Medication Sig Dispense Refill   acetaminophen (TYLENOL) 500 MG tablet Take 2 tablets (1,000 mg total) by mouth every 6 (six) hours as needed for mild pain.     amphetamine-dextroamphetamine (ADDERALL XR) 20 MG 24 hr capsule Take 1 capsule (20 mg total) by mouth every morning. 30 capsule 0    FLUoxetine (PROZAC) 20 MG capsule Take 1 capsule (20 mg total) by mouth daily. For anxiety 90 capsule 0   hydrOXYzine (ATARAX) 10 MG tablet Take 1 tablet (10 mg total) by mouth 2 (two) times daily as needed for anxiety. 60 tablet 0   Melatonin 10 MG TABS Take 10 mg by mouth at bedtime.     pantoprazole (PROTONIX) 20 MG tablet Take 20 mg by mouth daily. (Patient not taking: Reported on 12/09/2023)     No current facility-administered medications on file prior to visit.    BP 102/60   Pulse 70   Temp (!) 97.1 F (36.2 C) (Temporal)   Ht 5\' 11"  (1.803 m)   Wt 231 lb (104.8 kg)  SpO2 96%   BMI 32.22 kg/m  Objective:   Physical Exam Cardiovascular:     Rate and Rhythm: Normal rate.  Pulmonary:     Effort: Pulmonary effort is normal.  Skin:    General: Skin is warm and dry.     Findings: No erythema.     Comments: 0.25 cm rounded, deep mass to right mid to lower thoracic back with tenderness.            Assessment & Plan:  Localized skin mass, lump, or swelling Assessment & Plan: Evident on exam.  Differentials include lipoma versus cyst. No evidence of herpes zoster. Lower suspicion for spine degeneration causes given HPI.  Soft tissue ultrasound ordered and pending. Await results.  Orders: -     [place] CHEST SOFT TISSUE; Future        Doreene Nest, NP

## 2023-12-13 ENCOUNTER — Encounter: Payer: Self-pay | Admitting: *Deleted

## 2023-12-15 ENCOUNTER — Other Ambulatory Visit: Payer: Self-pay | Admitting: Primary Care

## 2023-12-15 DIAGNOSIS — F411 Generalized anxiety disorder: Secondary | ICD-10-CM

## 2023-12-16 ENCOUNTER — Ambulatory Visit

## 2023-12-19 ENCOUNTER — Other Ambulatory Visit: Payer: Self-pay | Admitting: Primary Care

## 2023-12-19 DIAGNOSIS — F411 Generalized anxiety disorder: Secondary | ICD-10-CM

## 2023-12-21 ENCOUNTER — Ambulatory Visit
Admission: RE | Admit: 2023-12-21 | Discharge: 2023-12-21 | Disposition: A | Discharge status: Home / Self Care | Source: Ambulatory Visit | Attending: Primary Care | Admitting: Primary Care

## 2023-12-21 DIAGNOSIS — R229 Localized swelling, mass and lump, unspecified: Secondary | ICD-10-CM | POA: Diagnosis present

## 2024-02-13 ENCOUNTER — Other Ambulatory Visit: Payer: Self-pay | Admitting: Primary Care

## 2024-02-13 DIAGNOSIS — F411 Generalized anxiety disorder: Secondary | ICD-10-CM

## 2024-02-22 ENCOUNTER — Other Ambulatory Visit: Payer: Self-pay | Admitting: Primary Care

## 2024-02-22 DIAGNOSIS — F411 Generalized anxiety disorder: Secondary | ICD-10-CM

## 2024-02-22 MED ORDER — FLUOXETINE HCL 20 MG PO CAPS: 20.0000 mg | ORAL_CAPSULE | Freq: Every day | ORAL | 1 refills | Status: DC

## 2024-06-01 NOTE — Progress Notes (Signed)
 This encounter was created in error - please disregard.

## 2024-09-04 ENCOUNTER — Other Ambulatory Visit: Payer: Self-pay | Admitting: Primary Care

## 2024-09-04 DIAGNOSIS — F411 Generalized anxiety disorder: Secondary | ICD-10-CM

## 2024-09-04 NOTE — Telephone Encounter (Signed)
Unable to leave vm    [employer]

## 2024-09-04 NOTE — Telephone Encounter (Signed)
 Patient is due for CPE/follow up in early to mid December, this will be required prior to any further refills.  Please schedule, thank you!

## 2024-09-28 ENCOUNTER — Other Ambulatory Visit: Payer: Self-pay | Admitting: Primary Care

## 2024-09-28 DIAGNOSIS — F411 Generalized anxiety disorder: Secondary | ICD-10-CM

## 2024-09-28 NOTE — Telephone Encounter (Signed)
 Patient is due for CPE/follow up, this will be required prior to any further refills.  Please schedule, thank you!

## 2024-10-01 NOTE — Telephone Encounter (Signed)
 Unable to leave vm, sent MyChart
